# Patient Record
Sex: Male | Born: 1970
Health system: Southern US, Community
[De-identification: ages and names within clinical notes are randomized; demographics above are authoritative.]

## PROBLEM LIST (undated history)

## (undated) ENCOUNTER — Ambulatory Visit: Payer: Medicaid Other | Source: Home / Self Care

## (undated) DIAGNOSIS — R569 Unspecified convulsions: Secondary | ICD-10-CM

## (undated) DIAGNOSIS — D696 Thrombocytopenia, unspecified: Secondary | ICD-10-CM

## (undated) DIAGNOSIS — M1990 Unspecified osteoarthritis, unspecified site: Secondary | ICD-10-CM

## (undated) DIAGNOSIS — Z720 Tobacco use: Secondary | ICD-10-CM

## (undated) DIAGNOSIS — K219 Gastro-esophageal reflux disease without esophagitis: Secondary | ICD-10-CM

## (undated) DIAGNOSIS — F1010 Alcohol abuse, uncomplicated: Secondary | ICD-10-CM

## (undated) DIAGNOSIS — F1210 Cannabis abuse, uncomplicated: Secondary | ICD-10-CM

## (undated) DIAGNOSIS — F1120 Opioid dependence, uncomplicated: Secondary | ICD-10-CM

## (undated) DIAGNOSIS — I10 Essential (primary) hypertension: Secondary | ICD-10-CM

## (undated) DIAGNOSIS — D689 Coagulation defect, unspecified: Secondary | ICD-10-CM

## (undated) DIAGNOSIS — K7030 Alcoholic cirrhosis of liver without ascites: Secondary | ICD-10-CM

## (undated) DIAGNOSIS — J449 Chronic obstructive pulmonary disease, unspecified: Secondary | ICD-10-CM

## (undated) DIAGNOSIS — G629 Polyneuropathy, unspecified: Secondary | ICD-10-CM

## (undated) HISTORY — DX: Gastro-esophageal reflux disease without esophagitis: K21.9

## (undated) HISTORY — DX: Unspecified convulsions: R56.9

## (undated) HISTORY — DX: Unspecified osteoarthritis, unspecified site: M19.90

## (undated) HISTORY — PX: HEMORRHOID SURGERY: SHX153

## (undated) HISTORY — DX: Chronic obstructive pulmonary disease, unspecified: J44.9

---

## 1997-11-04 ENCOUNTER — Emergency Department (HOSPITAL_COMMUNITY): Admission: EM | Admit: 1997-11-04 | Discharge: 1997-11-05 | Payer: Self-pay

## 1999-03-07 ENCOUNTER — Ambulatory Visit (HOSPITAL_COMMUNITY): Admission: RE | Admit: 1999-03-07 | Discharge: 1999-03-07 | Payer: Self-pay | Admitting: Surgery

## 1999-03-07 ENCOUNTER — Encounter (INDEPENDENT_AMBULATORY_CARE_PROVIDER_SITE_OTHER): Payer: Self-pay | Admitting: Specialist

## 2004-11-18 ENCOUNTER — Emergency Department (HOSPITAL_COMMUNITY): Admission: EM | Admit: 2004-11-18 | Discharge: 2004-11-18 | Payer: Self-pay | Admitting: Emergency Medicine

## 2005-03-20 ENCOUNTER — Emergency Department: Payer: Self-pay | Admitting: Internal Medicine

## 2005-03-20 ENCOUNTER — Other Ambulatory Visit: Payer: Self-pay

## 2007-12-23 ENCOUNTER — Ambulatory Visit: Payer: Self-pay | Admitting: Internal Medicine

## 2007-12-23 DIAGNOSIS — M1990 Unspecified osteoarthritis, unspecified site: Secondary | ICD-10-CM | POA: Insufficient documentation

## 2007-12-23 DIAGNOSIS — J441 Chronic obstructive pulmonary disease with (acute) exacerbation: Secondary | ICD-10-CM | POA: Insufficient documentation

## 2007-12-23 DIAGNOSIS — N209 Urinary calculus, unspecified: Secondary | ICD-10-CM | POA: Insufficient documentation

## 2007-12-23 DIAGNOSIS — R569 Unspecified convulsions: Secondary | ICD-10-CM | POA: Insufficient documentation

## 2008-02-08 ENCOUNTER — Telehealth: Payer: Self-pay | Admitting: Internal Medicine

## 2008-02-19 DIAGNOSIS — F1120 Opioid dependence, uncomplicated: Secondary | ICD-10-CM

## 2008-02-19 HISTORY — DX: Opioid dependence, uncomplicated: F11.20

## 2008-04-14 ENCOUNTER — Telehealth: Payer: Self-pay | Admitting: Internal Medicine

## 2008-04-22 ENCOUNTER — Ambulatory Visit: Payer: Self-pay | Admitting: Internal Medicine

## 2008-07-28 ENCOUNTER — Telehealth: Payer: Self-pay | Admitting: Internal Medicine

## 2008-08-05 ENCOUNTER — Ambulatory Visit: Payer: Self-pay | Admitting: Internal Medicine

## 2008-10-07 ENCOUNTER — Ambulatory Visit: Payer: Self-pay | Admitting: Internal Medicine

## 2008-11-23 ENCOUNTER — Telehealth: Payer: Self-pay | Admitting: Internal Medicine

## 2009-01-06 ENCOUNTER — Ambulatory Visit: Payer: Self-pay | Admitting: Internal Medicine

## 2009-01-06 DIAGNOSIS — M79609 Pain in unspecified limb: Secondary | ICD-10-CM | POA: Insufficient documentation

## 2009-01-06 DIAGNOSIS — G609 Hereditary and idiopathic neuropathy, unspecified: Secondary | ICD-10-CM | POA: Insufficient documentation

## 2009-01-06 LAB — CONVERTED CEMR LAB
ALT: 48 units/L (ref 0–53)
AST: 52 units/L — ABNORMAL HIGH (ref 0–37)
Albumin: 4.1 g/dL (ref 3.5–5.2)
Alkaline Phosphatase: 35 units/L — ABNORMAL LOW (ref 39–117)
BUN: 5 mg/dL — ABNORMAL LOW (ref 6–23)
Basophils Absolute: 0 10*3/uL (ref 0.0–0.1)
Basophils Relative: 0.4 % (ref 0.0–3.0)
Bilirubin Urine: NEGATIVE
Bilirubin, Direct: 0.1 mg/dL (ref 0.0–0.3)
CO2: 32 meq/L (ref 19–32)
Calcium: 9.3 mg/dL (ref 8.4–10.5)
Chloride: 103 meq/L (ref 96–112)
Creatinine, Ser: 0.9 mg/dL (ref 0.4–1.5)
Eosinophils Absolute: 0.2 10*3/uL (ref 0.0–0.7)
Eosinophils Relative: 2.2 % (ref 0.0–5.0)
Folate: 7.4 ng/mL
GFR calc non Af Amer: 100.3 mL/min (ref >60)
Glucose, Bld: 81 mg/dL (ref 70–99)
HCT: 41.2 % (ref 39.0–52.0)
Hemoglobin, Urine: NEGATIVE
Hemoglobin: 14 g/dL (ref 13.0–17.0)
Hgb A1c MFr Bld: 4.7 % (ref 4.6–6.5)
Ketones, ur: NEGATIVE mg/dL
Leukocytes, UA: NEGATIVE
Lymphocytes Relative: 34.8 % (ref 12.0–46.0)
Lymphs Abs: 2.7 10*3/uL (ref 0.7–4.0)
MCHC: 34 g/dL (ref 30.0–36.0)
MCV: 100.4 fL — ABNORMAL HIGH (ref 78.0–100.0)
Monocytes Absolute: 0.7 10*3/uL (ref 0.1–1.0)
Monocytes Relative: 9.4 % (ref 3.0–12.0)
Neutro Abs: 4.1 10*3/uL (ref 1.4–7.7)
Neutrophils Relative %: 53.2 % (ref 43.0–77.0)
Nitrite: NEGATIVE
Platelets: 256 10*3/uL (ref 150.0–400.0)
Potassium: 4.3 meq/L (ref 3.5–5.1)
RBC: 4.1 M/uL — ABNORMAL LOW (ref 4.22–5.81)
RDW: 12.6 % (ref 11.5–14.6)
Sodium: 142 meq/L (ref 135–145)
Specific Gravity, Urine: <=1.005 (ref 1.000–1.030)
TSH: 1.24 microintl units/mL (ref 0.35–5.50)
Total Bilirubin: 0.7 mg/dL (ref 0.3–1.2)
Total Protein, Urine: NEGATIVE mg/dL
Total Protein: 7.9 g/dL (ref 6.0–8.3)
Urine Glucose: NEGATIVE mg/dL
Urobilinogen, UA: 0.2 (ref 0.0–1.0)
Vitamin B-12: 382 pg/mL (ref 211–911)
WBC: 7.7 10*3/uL (ref 4.5–10.5)
pH: 5.5 (ref 5.0–8.0)

## 2009-01-09 ENCOUNTER — Telehealth: Payer: Self-pay | Admitting: Internal Medicine

## 2009-01-09 ENCOUNTER — Encounter: Payer: Self-pay | Admitting: Internal Medicine

## 2009-01-11 ENCOUNTER — Telehealth: Payer: Self-pay | Admitting: Internal Medicine

## 2009-03-20 ENCOUNTER — Ambulatory Visit: Payer: Self-pay | Admitting: Internal Medicine

## 2009-05-19 ENCOUNTER — Ambulatory Visit: Payer: Self-pay | Admitting: Internal Medicine

## 2009-07-14 ENCOUNTER — Telehealth: Payer: Self-pay | Admitting: Internal Medicine

## 2009-09-08 ENCOUNTER — Ambulatory Visit: Payer: Self-pay | Admitting: Internal Medicine

## 2009-11-08 ENCOUNTER — Telehealth: Payer: Self-pay | Admitting: Internal Medicine

## 2009-12-14 ENCOUNTER — Ambulatory Visit: Payer: Self-pay | Admitting: Internal Medicine

## 2009-12-27 ENCOUNTER — Encounter: Payer: Self-pay | Admitting: Internal Medicine

## 2009-12-27 ENCOUNTER — Telehealth: Payer: Self-pay | Admitting: Internal Medicine

## 2010-01-01 ENCOUNTER — Telehealth: Payer: Self-pay | Admitting: Internal Medicine

## 2010-01-02 ENCOUNTER — Encounter: Payer: Self-pay | Admitting: Internal Medicine

## 2010-01-22 ENCOUNTER — Telehealth (INDEPENDENT_AMBULATORY_CARE_PROVIDER_SITE_OTHER): Payer: Self-pay | Admitting: *Deleted

## 2010-03-08 ENCOUNTER — Telehealth: Payer: Self-pay | Admitting: Internal Medicine

## 2010-03-22 NOTE — Letter (Signed)
Summary: Generic Letter  [facility]  [address] [place], [place] [address]   Phone: [phone]  Fax: [phone]        12/27/2009  Jacob [facility] [address] [place], [place]  [address]  Dear [name],   The above patient is being treated for a seizure disorder with Valium.        Sincerely,   Sanda Linger MD  Appended Document: Generic Letter [facility] main # gave [place] the wrong fax #. LEft vm w/[facility] to get correct fax #  (phone # is [phone])  Appended Document: Generic Letter Letter re-sent to [facility] per Ami. Pt called today at 12:45 to inquire on status of fax and was informed of same via VM

## 2010-03-22 NOTE — Progress Notes (Signed)
Summary: med refill  Phone Note Refill Request Message from:  Pharmacy on March 08, 2010 9:57 AM  Refills Requested: Medication #1:  DIAZEPAM 10 MG TABS every 6 hrs as needed   Dosage confirmed as above?Dosage Confirmed   Supply Requested: 3 months Initial call taken by: Zella Ball Ewing CMA Duncan Dull),  March 08, 2010 9:57 AM  Follow-up for Phone Call        ok Follow-up by: Etta Grandchild MD,  March 08, 2010 11:03 AM  Additional Follow-up for Phone Call Additional follow up Details #1::        faxed hardcopy to pharmacy Additional Follow-up by: Robin Ewing CMA Duncan Dull),  March 08, 2010 11:34 AM    Prescriptions: DIAZEPAM 10 MG TABS (DIAZEPAM) every 6 hrs as needed  #60 x 3   Entered by:   Zella Ball Ewing CMA (AAMA)   Authorized by:   Etta Grandchild MD   Signed by:   Scharlene Gloss CMA (AAMA) on 03/08/2010   Method used:   Printed then faxed to ...       [facility]  [address] # [id number]* (retail)       [address]       [place], [place]  [address]       Ph: [phone] or [phone]       Fax: [phone]   RxID:   [id number]

## 2010-03-22 NOTE — Letter (Signed)
Summary: Generic Letter  [facility]  [address] [place], [place] [address]   Phone: [phone]  Fax: [phone]         09/08/2009    DIONIS AUTRY [address] [place], [place]  [address]  Dear Mr. Placzek,  I am writing this letter at your request to comment on your medical conditions. It is my understanding from you that you have been turned down on your application for disability and that a note from your physician would be helpful.  It is my opinion that you will out of work for an extended period of time (> 2 years) due to degenerative joint disease in your feet, peripheral neuropathy, and seizure disorder.       Sincerely,   Sanda Linger MD

## 2010-03-22 NOTE — Assessment & Plan Note (Signed)
Summary: follow up rx refill-lb   Vital Signs:  Patient profile:   40 year old male Height:      72 inches Weight:      192 pounds BMI:     26.13 O2 Sat:      98 % on Room air Temp:     98.1 degrees F oral Pulse rate:   90 / minute Pulse rhythm:   regular Resp:     16 per minute BP sitting:   132 / 80  (left arm) Cuff size:   large  Vitals Entered By: Rock Nephew CMA (March 20, 2009 1:57 PM)  Nutrition Counseling: Patient's BMI is greater than 25 and therefore counseled on weight management options.  O2 Flow:  Room air  Primary Care Provider:  Etta Grandchild MD   History of Present Illness: He returns for f/up and says that he was not able to do the NCS/EMG due to the cost. He wants to apply for Medicaid. He has worsening arthritis pain in his left foot in the 1st MTP joint.  He is doing well on Diazepam with no seizures.  Hypertension History:      He denies headache, chest pain, palpitations, dyspnea with exertion, orthopnea, PND, peripheral edema, visual symptoms, neurologic problems, and syncope.        Positive major cardiovascular risk factors include hypertension and current tobacco user.  Negative major cardiovascular risk factors include male age less than 43 years old, no history of diabetes or hyperlipidemia, and negative family history for ischemic heart disease.        Further assessment for target organ damage reveals no history of ASHD, cardiac end-organ damage (CHF/LVH), stroke/TIA, peripheral vascular disease, renal insufficiency, or hypertensive retinopathy.     Preventive Screening-Counseling & Management  Alcohol-Tobacco     Smoking Cessation Counseling: yes  Current Medications (verified): 1)  Diazepam 10 Mg Tabs (Diazepam) .... Every 6 Hrs As Needed 2)  Methadone Hcl 40 Mg Tbso (Methadone Hcl) .... 3 Once Daily 3)  Celebrex 200 Mg Caps (Celecoxib) .... One By Mouth Once Daily As Needed For Arthritis  Allergies (verified): 1)  ! Codeine 2)   ! Nsaids  Past History:  Past Medical History: Reviewed history from 12/23/2007 and no changes required. COPD GERD Hypertension Osteoarthritis Seizure disorder  Past Surgical History: Reviewed history from 12/23/2007 and no changes required. Denies surgical history  Family History: Reviewed history from 12/23/2007 and no changes required. Family History Diabetes 1st degree relative Family History Hypertension  Social History: Reviewed history from 12/23/2007 and no changes required. Domestic Partner Current Smoker Alcohol use-yes Drug use-yes Regular exercise-no  Review of Systems  The patient denies anorexia, fever, weight loss, prolonged cough, hemoptysis, abdominal pain, hematuria, abnormal bleeding, and enlarged lymph nodes.   MS:  Complains of joint pain; denies joint redness, joint swelling, loss of strength, muscle, muscle weakness, stiffness, and thoracic pain.  Physical Exam  General:  alert, well-developed, well-nourished, well-hydrated, cooperative to examination, and good hygiene.   Head:  normocephalic, atraumatic, and no abnormalities observed.   Mouth:  Oral mucosa and oropharynx without lesions or exudates.  Teeth in good repair. Neck:  supple, full ROM, no masses, and no thyromegaly.   Lungs:  normal respiratory effort, no intercostal retractions, no accessory muscle use, normal breath sounds, and no dullness.   Heart:  normal rate, regular rhythm, no murmur, and no gallop.   Abdomen:  soft, non-tender, normal bowel sounds, no distention, no masses, no hepatomegaly,  and no splenomegaly.   Msk:  normal ROM, no joint tenderness, no joint swelling, no joint warmth, no redness over joints, no joint deformities, no joint instability, and no crepitation. he has palpable DJD in the left foot, 1st MTP joint.  Pulses:  R and L carotid,radial,femoral,dorsalis pedis and posterior tibial pulses are full and equal bilaterally Extremities:  No clubbing, cyanosis,  edema, or deformity noted with normal full range of motion of all joints.   Neurologic:  No cranial nerve deficits noted. Station and gait are normal. Plantar reflexes are down-going bilaterally. DTRs are symmetrical throughout. Sensory, motor and coordinative functions appear intact. Skin:  turgor normal, color normal, no rashes, no suspicious lesions, no ecchymoses, and no petechiae.   Psych:  Cognition and judgment appear intact. Alert and cooperative with normal attention span and concentration. No apparent delusions, illusions, hallucinations   Impression & Recommendations:  Problem # 1:  FOOT PAIN, BILATERAL (ICD-729.5) Assessment Deteriorated he may need a surgical fusion of this joint Orders: Social Work Referral (Social )  Problem # 2:  SEIZURE DISORDER (ICD-780.39) Assessment: Unchanged  Problem # 3:  OSTEOARTHRITIS (ICD-715.90) Assessment: Deteriorated  His updated medication list for this problem includes:    Methadone Hcl 40 Mg Tbso (Methadone hcl) .Marland KitchenMarland KitchenMarland KitchenMarland Kitchen 3 once daily    Celebrex 200 Mg Caps (Celecoxib) ..... One by mouth once daily as needed for arthritis  Orders: Orthopedic Surgeon Referral (Ortho Surgeon)  Discussed use of medications, application of heat or cold, and exercises.   Complete Medication List: 1)  Diazepam 10 Mg Tabs (Diazepam) .... Every 6 hrs as needed 2)  Methadone Hcl 40 Mg Tbso (Methadone hcl) .... 3 once daily 3)  Celebrex 200 Mg Caps (Celecoxib) .... One by mouth once daily as needed for arthritis  Hypertension Assessment/Plan:      The patient's hypertensive risk group is category B: At least one risk factor (excluding diabetes) with no target organ damage.  Today's blood pressure is 132/80.  His blood pressure goal is < 140/90.  Patient Instructions: 1)  Please schedule a follow-up appointment in 2 months. 2)  Tobacco is very bad for your health and your loved ones! You Should stop smoking!. 3)  Stop Smoking Tips: Choose a Quit date. Cut  down before the Quit date. decide what you will do as a substitute when you feel the urge to smoke(gum,toothpick,exercise). 4)  Check your Blood Pressure regularly. If it is above 140/90: you should make an appointment. Prescriptions: DIAZEPAM 10 MG TABS (DIAZEPAM) every 6 hrs as needed  #60 x 3   Entered and Authorized by:   Etta Grandchild MD   Signed by:   Etta Grandchild MD on 03/20/2009   Method used:   Print then Give to Patient   RxID:   [id number] CELEBREX 200 MG CAPS (CELECOXIB) One by mouth once daily as needed for arthritis  #30 x 0   Entered and Authorized by:   Etta Grandchild MD   Signed by:   Etta Grandchild MD on 03/20/2009   Method used:   Samples Given   RxID:   [id number]

## 2010-03-22 NOTE — Assessment & Plan Note (Signed)
Summary: FU [facility]  RS'D PER PT/NWS   Vital Signs:  Patient profile:   40 year old male Height:      72 inches Weight:      194.50 pounds BMI:     26.47 O2 Sat:      97 % on Room air Temp:     97.8 degrees F oral Pulse rate:   80 / minute Pulse rhythm:   regular Resp:     16 per minute BP sitting:   100 / 72  (left arm) Cuff size:   large  Vitals Entered By: Rock Nephew CMA (September 08, 2009 1:22 PM)  Nutrition Counseling: Patient's BMI is greater than 25 and therefore counseled on weight management options.  O2 Flow:  Room air CC: Bilateral foot pain  Is Patient Diabetic? No Pain Assessment Patient in pain? no        Primary Care Provider:  Etta Grandchild MD  CC:  Bilateral foot pain .  History of Present Illness: He has DJD in both feet and he needs a letter for a disability claim since he can't work. He can't afford to see ortho. or neurology.  Preventive Screening-Counseling & Management  Alcohol-Tobacco     Alcohol drinks/day: 2     Smoking Status: current     Smoking Cessation Counseling: yes     Packs/Day: 2     Year Started: 1989     Cans of tobacco/week: no     Passive Smoke Exposure: yes     Tobacco Counseling: to quit use of tobacco products  Hep-HIV-STD-Contraception     Hepatitis Risk: no risk noted     HIV Risk: no     STD Risk: no risk noted      Drug Use:  yes.    Medications Prior to Update: 1)  Diazepam 10 Mg Tabs (Diazepam) .... Every 6 Hrs As Needed 2)  Methadone Hcl 40 Mg Tbso (Methadone Hcl) .... 3 Once Daily  Current Medications (verified): 1)  Diazepam 10 Mg Tabs (Diazepam) .... Every 6 Hrs As Needed 2)  Methadone Hcl 40 Mg Tbso (Methadone Hcl) .... 3 Once Daily  Allergies (verified): 1)  ! Codeine 2)  ! Nsaids  Past History:  Past Surgical History: Last updated: 12/23/2007 Denies surgical history  Family History: Last updated: 12/23/2007 Family History Diabetes 1st degree relative Family History  Hypertension  Social History: Last updated: 12/23/2007 Domestic Partner Current Smoker Alcohol use-yes Drug use-yes Regular exercise-no  Risk Factors: Alcohol Use: 2 (09/08/2009) Exercise: no (12/23/2007)  Risk Factors: Smoking Status: current (09/08/2009) Packs/Day: 2 (09/08/2009) Cans of tobacco/wk: no (09/08/2009) Passive Smoke Exposure: yes (09/08/2009)  Past Medical History: COPD GERD Osteoarthritis Seizure disorder  Family History: Reviewed history from 12/23/2007 and no changes required. Family History Diabetes 1st degree relative Family History Hypertension  Social History: Reviewed history from 12/23/2007 and no changes required. Domestic Partner Current Smoker Alcohol use-yes Drug use-yes Regular exercise-no Hepatitis Risk:  no risk noted STD Risk:  no risk noted  Review of Systems       The patient complains of difficulty walking.  The patient denies anorexia, fever, weight loss, weight gain, chest pain, syncope, dyspnea on exertion, peripheral edema, abdominal pain, hematuria, and depression.   MS:  Complains of joint pain; denies joint redness, joint swelling, low back pain, muscle aches, and stiffness. Neuro:  Denies brief paralysis, difficulty with concentration, headaches, numbness, poor balance, seizures, tingling, tremors, and weakness.  Physical Exam  General:  alert, well-developed, well-nourished, well-hydrated, cooperative to examination, and good hygiene.   Mouth:  Oral mucosa and oropharynx without lesions or exudates.  Teeth in good repair. Neck:  supple, full ROM, no masses, and no thyromegaly.   Lungs:  normal respiratory effort, no intercostal retractions, no accessory muscle use, normal breath sounds, and no dullness.   Heart:  normal rate, regular rhythm, no murmur, and no gallop.   Abdomen:  soft, non-tender, normal bowel sounds, no distention, no masses, no hepatomegaly, and no splenomegaly.   Msk:  normal ROM, no joint  tenderness, no joint swelling, no joint warmth, no redness over joints, no joint deformities, no joint instability, and no crepitation. he has palpable DJD in the feet, 1st MTP joint.  Pulses:  R and L carotid,radial,femoral,dorsalis pedis and posterior tibial pulses are full and equal bilaterally Extremities:  No clubbing, cyanosis, edema, or deformity noted with normal full range of motion of all joints.   Neurologic:  No cranial nerve deficits noted. Station and gait are normal. Plantar reflexes are down-going bilaterally. DTRs are symmetrical throughout. Sensory, motor and coordinative functions appear intact. Skin:  turgor normal, color normal, no rashes, no suspicious lesions, no ecchymoses, and no petechiae.   Cervical Nodes:  no anterior cervical adenopathy and no posterior cervical adenopathy.   Psych:  Cognition and judgment appear intact. Alert and cooperative with normal attention span and concentration. No apparent delusions, illusions, hallucinations   Impression & Recommendations:  Problem # 1:  FOOT PAIN, BILATERAL (ICD-729.5) Assessment Unchanged  Problem # 2:  OSTEOARTHRITIS (ICD-715.90) Assessment: Unchanged  His updated medication list for this problem includes:    Methadone Hcl 40 Mg Tbso (Methadone hcl) .Marland KitchenMarland KitchenMarland KitchenMarland Kitchen 3 once daily  Complete Medication List: 1)  Diazepam 10 Mg Tabs (Diazepam) .... Every 6 hrs as needed 2)  Methadone Hcl 40 Mg Tbso (Methadone hcl) .... 3 once daily  Patient Instructions: 1)  Please schedule a follow-up appointment in 3 months. 2)  Tobacco is very bad for your health and your loved ones! You Should stop smoking!. 3)  Stop Smoking Tips: Choose a Quit date. Cut down before the Quit date. decide what you will do as a substitute when you feel the urge to smoke(gum,toothpick,exercise). 4)  Take 650-1000mg  of Tylenol every 4-6 hours as needed for relief of pain or comfort of fever AVOID taking more than 4000mg   in a 24 hour period (can cause liver  damage in higher doses).

## 2010-03-22 NOTE — Letter (Signed)
Summary: Generic Letter  [facility]  [address] [place], [place] [address]   Phone: [phone]  Fax: [phone]    01/02/2010  ERIC NEES [address] [place], [place]  [address]    To Whom It May Concern:   This letter should serve as written notice that the above named patient   does not and has not received prescriptions for Methodone from our   office.               Sincerely,     Renae Fickle.D.

## 2010-03-22 NOTE — Assessment & Plan Note (Signed)
Summary: 2 MO ROV /NWS  #   Vital Signs:  Patient profile:   40 year old male Height:      72 inches Weight:      203 pounds BMI:     27.63 O2 Sat:      94 % on Room air Temp:     98.1 degrees F oral Pulse rate:   90 / minute Pulse rhythm:   regular Resp:     16 per minute BP sitting:   120 / 80  (left arm) Cuff size:   large  Vitals Entered By: Rock Nephew CMA (May 19, 2009 1:10 PM)  Nutrition Counseling: Patient's BMI is greater than 25 and therefore counseled on weight management options.  O2 Flow:  Room air CC: headache, arm pain Is Patient Diabetic? No Pain Assessment Patient in pain? yes     Location: arm Type: dull   Primary Care Provider:  Etta Grandchild MD  CC:  headache and arm pain.  History of Present Illness: He returns for f/up and he reports that he is doing well and has not had any seizures since I last saw him. He is still having foot pain and he is considering applying for Medicaid/Disablilty. He stopped taking Celebrex b/c it caused a headache.  Preventive Screening-Counseling & Management  Alcohol-Tobacco     Smoking Cessation Counseling: yes  Current Medications (verified): 1)  Diazepam 10 Mg Tabs (Diazepam) .... Every 6 Hrs As Needed 2)  Methadone Hcl 40 Mg Tbso (Methadone Hcl) .... 3 Once Daily 3)  Celebrex 200 Mg Caps (Celecoxib) .... One By Mouth Once Daily As Needed For Arthritis  Allergies (verified): 1)  ! Codeine 2)  ! Nsaids  Past History:  Past Medical History: Reviewed history from 12/23/2007 and no changes required. COPD GERD Hypertension Osteoarthritis Seizure disorder  Past Surgical History: Reviewed history from 12/23/2007 and no changes required. Denies surgical history  Family History: Reviewed history from 12/23/2007 and no changes required. Family History Diabetes 1st degree relative Family History Hypertension  Social History: Reviewed history from 12/23/2007 and no changes required. Domestic  Partner Current Smoker Alcohol use-yes Drug use-yes Regular exercise-no  Review of Systems  The patient denies anorexia, fever, weight loss, weight gain, chest pain, dyspnea on exertion, peripheral edema, prolonged cough, headaches, hemoptysis, abdominal pain, hematuria, suspicious skin lesions, and depression.    Physical Exam  General:  alert, well-developed, well-nourished, well-hydrated, cooperative to examination, and good hygiene.   Head:  normocephalic, atraumatic, and no abnormalities observed.   Mouth:  Oral mucosa and oropharynx without lesions or exudates.  Teeth in good repair. Neck:  supple, full ROM, no masses, and no thyromegaly.   Lungs:  normal respiratory effort, no intercostal retractions, no accessory muscle use, normal breath sounds, and no dullness.   Heart:  normal rate, regular rhythm, no murmur, and no gallop.   Abdomen:  soft, non-tender, normal bowel sounds, no distention, no masses, no hepatomegaly, and no splenomegaly.   Msk:  normal ROM, no joint tenderness, no joint swelling, no joint warmth, no redness over joints, no joint deformities, no joint instability, and no crepitation. he has palpable DJD in the left foot, 1st MTP joint.  Pulses:  R and L carotid,radial,femoral,dorsalis pedis and posterior tibial pulses are full and equal bilaterally Extremities:  No clubbing, cyanosis, edema, or deformity noted with normal full range of motion of all joints.   Neurologic:  No cranial nerve deficits noted. Station and gait are normal.  Plantar reflexes are down-going bilaterally. DTRs are symmetrical throughout. Sensory, motor and coordinative functions appear intact. Skin:  turgor normal, color normal, no rashes, no suspicious lesions, no ecchymoses, and no petechiae.   Cervical Nodes:  no anterior cervical adenopathy and no posterior cervical adenopathy.   Psych:  Cognition and judgment appear intact. Alert and cooperative with normal attention span and  concentration. No apparent delusions, illusions, hallucinations   Impression & Recommendations:  Problem # 1:  FOOT PAIN, BILATERAL (ICD-729.5) Assessment Unchanged  Problem # 2:  PERIPHERAL NEUROPATHY (ICD-356.9) Pt will not do any further testing due to the cost.  Problem # 3:  SEIZURE DISORDER (ICD-780.39) Assessment: Unchanged  Problem # 4:  HYPERTENSION (ICD-401.9) Assessment: Improved  BP today: 120/80 Prior BP: 132/80 (03/20/2009)  Prior 10 Yr Risk Heart Disease: Not enough information (12/23/2007)  Labs Reviewed: K+: 4.3 (01/06/2009) Creat: : 0.9 (01/06/2009)     Complete Medication List: 1)  Diazepam 10 Mg Tabs (Diazepam) .... Every 6 hrs as needed 2)  Methadone Hcl 40 Mg Tbso (Methadone hcl) .... 3 once daily  Patient Instructions: 1)  Please schedule a follow-up appointment as needed. 2)  Tobacco is very bad for your health and your loved ones! You Should stop smoking!. 3)  Stop Smoking Tips: Choose a Quit date. Cut down before the Quit date. decide what you will do as a substitute when you feel the urge to smoke(gum,toothpick,exercise).

## 2010-03-22 NOTE — Progress Notes (Signed)
Summary: LETTER  Phone Note Call from Patient Call back at Home Phone [phone]   Summary of Call: Pt is changing methadone clinics, from [facility] to [facility]. They need letter from MD stating pt is being treated for seizure disorder with valium.  Initial call taken by: Lamar Sprinkles, CMA,  December 27, 2009 3:07 PM  Follow-up for Phone Call        Pt informed, [facility] Follow-up by: Lamar Sprinkles, CMA,  December 27, 2009 4:28 PM

## 2010-03-22 NOTE — Progress Notes (Signed)
  Phone Note Refill Request Message from:  [employer] on November 08, 2009 4:40 PM  Refills Requested: Medication #1:  DIAZEPAM 10 MG TABS every 6 hrs as needed   Dosage confirmed as above?Dosage Confirmed   Supply Requested: 3 months  Is this ok to refill?  Initial call taken by: Rock Nephew CMA,  November 08, 2009 4:40 PM  Follow-up for Phone Call        yes Follow-up by: Etta Grandchild MD,  November 08, 2009 4:49 PM    Prescriptions: DIAZEPAM 10 MG TABS (DIAZEPAM) every 6 hrs as needed  #60 x 3   Entered by:   Rock Nephew CMA   Authorized by:   Etta Grandchild MD   Signed by:   Rock Nephew CMA on 11/09/2009   Method used:   Telephoned to ...       [facility]  [address] # [facility]* (retail)       [address]       [place], [place]  [address]       Ph: [phone] or [phone]       Fax: [phone]   RxID:   [id number]

## 2010-03-22 NOTE — Progress Notes (Signed)
Summary: Letter request  Phone Note Call from Patient Call back at Home Phone [phone]   Caller: Patient Call For: Etta Grandchild MD Summary of Call: Pt stated that he need a letter stating that Dr. Yetta Barre does not  precribe METHADONE to him. Pt stated that he is transfering from [facility] to [facility] and both place need the letter from Dr. Yetta Barre in order for pt to receive Methadone. Pt need that ASAP. Thanks  Initial call taken by: Livingston Diones,  January 01, 2010 4:08 PM  Follow-up for Phone Call        Would this be ok to provide for pt.Alvy Beal Archie CMA  January 02, 2010 8:52 AM   Additional Follow-up for Phone Call Additional follow up Details #1::        sure Additional Follow-up by: Etta Grandchild MD,  January 02, 2010 8:58 AM    Additional Follow-up for Phone Call Additional follow up Details #2::    LMOVM for pt to call back// need to advise letters are ready  Spoke w/pt & treatment center. Per Nurse at treatment center - They need letter stating that MD is aware that patient is on Methadone 120mg  liquid once daily and MD is ok prescribing a benzodiazepine, Valium 10mg  1 every six hours as needed.   Ok for above letter?  [facility] to be [facility] to [facility] at [phone] and current tx facility [phone]..................Marland KitchenLamar Sprinkles, CMA  January 02, 2010 9:35 AM   Additional Follow-up for Phone Call Additional follow up Details #3:: Details for Additional Follow-up Action Taken: yes   letter completed & faxed to both #'s................Marland KitchenLamar Sprinkles, CMA  January 02, 2010 12:12 PM  Additional Follow-up by: Etta Grandchild MD,  January 02, 2010 9:44 AM

## 2010-03-22 NOTE — Assessment & Plan Note (Signed)
Summary: 3 MO ROV /NWS  #   Vital Signs:  Patient profile:   40 year old male Height:      72 inches Weight:      202 pounds BMI:     27.50 O2 Sat:      97 % on Room air Temp:     98.5 degrees F oral Pulse rate:   80 / minute Pulse rhythm:   regular Resp:     16 per minute BP sitting:   126 / 80  (left arm) Cuff size:   large  Vitals Entered By: Rock Nephew CMA (December 14, 2009 1:07 PM)  Nutrition Counseling: Patient's BMI is greater than 25 and therefore counseled on weight management options.  O2 Flow:  Room air CC: follow-up visit Is Patient Diabetic? No  Does patient need assistance? Functional Status Self care Ambulation Normal   Primary Care [facility] [place]el[place]:  Etta Grandchild MD  CC:  follow-up visit.  History of Present Illness: He returns for f/up and to remind me of his foot pain. He has not had surgery yet b/c he does not have the money and has not been granted medicaid or medicare benefits yet. He has no new or worsening symptoms today. He has not had a seizure.  Preventive Screening-Counseling & Management  Alcohol-Tobacco     Alcohol drinks/day: 2     Alcohol type: all     >5/day in last 3 mos: no     Alcohol Counseling: to decrease amount and/or frequency of alcohol intake     Feels need to cut down: no     Feels annoyed by complaints: no     Feels guilty re: drinking: no     Needs 'eye opener' in am: no     Smoking Status: current     Smoking Cessation Counseling: yes     Packs/Day: 2     Year Started: 1989     Cans of tobacco/week: no     Passive Smoke Exposure: yes     Tobacco Counseling: to quit use of tobacco products  Hep-HIV-STD-Contraception     Hepatitis Risk: no risk noted     HIV Risk: no     STD Risk: no risk noted      Drug Use:  yes.    Medications Prior to Update: 1)  Diazepam 10 Mg Tabs (Diazepam) .... Every 6 Hrs As Needed 2)  Methadone Hcl 40 Mg Tbso (Methadone Hcl) .... 3 Once Daily  Current Medications (verified): 1)   Diazepam 10 Mg Tabs (Diazepam) .... Every 6 Hrs As Needed 2)  Methadone Hcl 40 Mg Tbso (Methadone Hcl) .... 3 Once Daily  Allergies (verified): 1)  ! Codeine 2)  ! Nsaids  Past History:  Past Medical History: Last updated: 09/08/2009 COPD GERD Osteoarthritis Seizure disorder  Past Surgical History: Last updated: 12/23/2007 Denies surgical history  Family History: Last updated: 12/23/2007 Family History Diabetes 1st degree relative Family History Hypertension  Social History: Last updated: 12/23/2007 Domestic Partner Current Smoker Alcohol use-yes Drug use-yes Regular exercise-no  Risk Factors: Alcohol Use: 2 (12/14/2009) >5 drinks/d w/in last 3 months: no (12/14/2009) Exercise: no (12/23/2007)  Risk Factors: Smoking Status: current (12/14/2009) Packs/Day: 2 (12/14/2009) Cans of tobacco/wk: no (12/14/2009) Passive Smoke Exposure: yes (12/14/2009)  Family History: Reviewed history from 12/23/2007 and no changes required. Family History Diabetes 1st degree relative Family History Hypertension  Social History: Reviewed history from 12/23/2007 and no changes required. Domestic Partner Current Smoker Alcohol use-yes  Drug use-yes Regular exercise-no  Review of Systems  The patient denies anorexia, fever, chest pain, peripheral edema, prolonged cough, headaches, hemoptysis, abdominal pain, hematuria, depression, enlarged lymph nodes, and angioedema.   MS:  Complains of joint pain; denies joint redness, joint swelling, loss of strength, low back pain, muscle aches, and stiffness. Neuro:  Denies difficulty with concentration, disturbances in coordination, falling down, headaches, inability to speak, memory loss, numbness, seizures, tingling, tremors, visual disturbances, and weakness.  Physical Exam  General:  alert, well-developed, well-nourished, well-hydrated, cooperative to examination, and good hygiene.   Mouth:  Oral mucosa and oropharynx without lesions  or exudates.  Teeth in good repair. Neck:  supple, full ROM, no masses, and no thyromegaly.   Lungs:  normal respiratory effort, no intercostal retractions, no accessory muscle use, normal breath sounds, and no dullness.   Heart:  normal rate, regular rhythm, no murmur, and no gallop.   Abdomen:  soft, non-tender, normal bowel sounds, no distention, no masses, no hepatomegaly, and no splenomegaly.   Msk:  normal ROM, no joint tenderness, no joint swelling, no joint warmth, no redness over joints, no joint deformities, no joint instability, and no crepitation. he has palpable DJD in the feet, 1st MTP joint.  Pulses:  R and L carotid,radial,femoral,dorsalis pedis and posterior tibial pulses are full and equal bilaterally Extremities:  No clubbing, cyanosis, edema, or deformity noted with normal full range of motion of all joints.   Neurologic:  No cranial nerve deficits noted. Station and gait are normal. Plantar reflexes are down-going bilaterally. DTRs are symmetrical throughout. Sensory, motor and coordinative functions appear intact. Skin:  turgor normal, color normal, no rashes, no suspicious lesions, no ecchymoses, and no petechiae.   Cervical Nodes:  no anterior cervical adenopathy and no posterior cervical adenopathy.   Psych:  Cognition and judgment appear intact. Alert and cooperative with normal attention span and concentration. No apparent delusions, illusions, hallucinations   Impression & Recommendations:  Problem # 1:  FOOT PAIN, BILATERAL (ICD-729.5) Assessment Unchanged  Problem # 2:  SEIZURE DISORDER (ICD-780.39) Assessment: Improved  Problem # 3:  OSTEOARTHRITIS (ICD-715.90) Assessment: Unchanged  His updated medication list for this problem includes:    Methadone Hcl 40 Mg Tbso (Methadone hcl) .Marland KitchenMarland KitchenMarland KitchenMarland Kitchen 3 once daily  Complete Medication List: 1)  Diazepam 10 Mg Tabs (Diazepam) .... Every 6 hrs as needed 2)  Methadone Hcl 40 Mg Tbso (Methadone hcl) .... 3 once  daily  Patient Instructions: 1)  Please schedule a follow-up appointment in 3 months. 2)  Tobacco is very bad for your health and your loved ones! You Should stop smoking!. 3)  Stop Smoking Tips: Choose a Quit date. Cut down before the Quit date. decide what you will do as a substitute when you feel the urge to smoke(gum,toothpick,exercise).   Orders Added: 1)  Est. Patient Level III [30865]   Immunization History:  Tetanus/Td Immunization History:    Tetanus/Td:  historical (10/23/2004)   Immunization History:  Tetanus/Td Immunization History:    Tetanus/Td:  Historical (10/23/2004)  Prevention & Chronic Care Immunizations   Influenza vaccine: Not documented   Influenza vaccine deferral: Refused  (12/14/2009)    Tetanus booster: 10/23/2004: Historical   Td booster deferral: Refused  (12/14/2009)    Pneumococcal vaccine: Not documented  Other Screening   Smoking status: current  (12/14/2009)   Smoking cessation counseling: yes  (12/14/2009)  Lipids   Total Cholesterol: Not documented   LDL: Not documented   LDL Direct: Not documented  HDL: Not documented   Triglycerides: Not documented

## 2010-03-22 NOTE — Letter (Signed)
Summary: Generic Letter  [facility]  [address] [place], [place] [address]   Phone: [phone]  Fax: [phone]    01/02/2010  [name] [address] [place], [place]  [address]  FAX: [phone] AND [phone]   To whom it may concern   I am the current primary care physician of the above named patient, Jacob Hill, DOB October 30, 1970. I prescribe a benzodiazepine, valium 10mg  1 every six hours as needed for his seizure disorder. I am aware that he is also prescribed Methadone 120mg  once daily.       Sincerely,    Sanda Linger, M.D.

## 2010-03-22 NOTE — Progress Notes (Signed)
  Phone Note Other Incoming   Request: Send information Summary of Call: Request for records received from [facility]. Request forwarded to [facility].     

## 2010-03-22 NOTE — Progress Notes (Signed)
Summary: REFILL   Phone Note Refill Request Call back at Home Phone [phone]   Refills Requested: Medication #1:  DIAZEPAM 10 MG TABS every 6 hrs as needed   Supply Requested: 6 months Initial call taken by: Lamar Sprinkles, CMA,  Jul 14, 2009 4:03 PM  Follow-up for Phone Call        ok Follow-up by: Etta Grandchild MD,  Jul 14, 2009 4:08 PM  Additional Follow-up for Phone Call Additional follow up Details #1::        Pt informed  Additional Follow-up by: Lamar Sprinkles, CMA,  Jul 14, 2009 5:13 PM    Prescriptions: DIAZEPAM 10 MG TABS (DIAZEPAM) every 6 hrs as needed  #60 x 3   Entered by:   Lamar Sprinkles, CMA   Authorized by:   Etta Grandchild MD   Signed by:   Lamar Sprinkles, CMA on 07/14/2009   Method used:   Telephoned to ...       [facility]  [address] # [id number]* (retail)       [address]       [place], [place]  [address]       Ph: [phone] or [phone]       Fax: [phone]   RxID:   [id number]

## 2010-04-16 ENCOUNTER — Encounter: Payer: Self-pay | Admitting: Internal Medicine

## 2010-04-16 ENCOUNTER — Ambulatory Visit (INDEPENDENT_AMBULATORY_CARE_PROVIDER_SITE_OTHER): Payer: Self-pay | Admitting: Internal Medicine

## 2010-04-16 DIAGNOSIS — M1990 Unspecified osteoarthritis, unspecified site: Secondary | ICD-10-CM

## 2010-04-16 DIAGNOSIS — R569 Unspecified convulsions: Secondary | ICD-10-CM

## 2010-04-26 NOTE — Letter (Signed)
Summary: Generic Letter  [facility]  [address] [place], [place] [address]   Phone: [phone]  Fax: [phone]          04/16/2010  Jacob Hill [address] [place], [place]  [address]  Dear Mr. Sherrin,  This letter serves to document that I DO NOT and have NEVER prescribed Methadone for you. I do prescribe Valium for you seizure disorder.       Sincerely,   Sanda Linger MD

## 2010-04-26 NOTE — Assessment & Plan Note (Signed)
Summary: 3-4 MO FU Jacob Hill Natale Milch   Vital Signs:  Patient profile:   40 year old male Height:      72 inches Weight:      199 pounds BMI:     27.09 O2 Sat:      99 % on Room air Temp:     99.5 degrees F oral Pulse rate:   80 / minute Pulse rhythm:   regular Resp:     16 per minute BP sitting:   134 / 86  (left arm) Cuff size:   regular  Vitals Entered By: Lamar Sprinkles, CMA (April 16, 2010 2:14 PM)  Nutrition Counseling: Patient's BMI is greater than 25 and therefore counseled on weight management options.  O2 Flow:  Room air CC: F/u Is Patient Diabetic? No Pain Assessment Patient in pain? no        Primary Care Provider:  Etta Grandchild MD  CC:  F/u.  History of Present Illness:  Follow-Up Visit      This is a 40 year old man who presents for Follow-up visit.  The patient denies chest pain, palpitations, dizziness, syncope, edema, SOB, DOE, PND, and orthopnea.  Since the last visit the patient notes no new problems or concerns.  The patient reports taking meds as prescribed and monitoring BP.  When questioned about possible medication side effects, the patient notes none.    Preventive Screening-Counseling & Management  Alcohol-Tobacco     Alcohol drinks/day: 2     Alcohol type: all     >5/day in last 3 mos: no     Alcohol Counseling: to decrease amount and/or frequency of alcohol intake     Feels need to cut down: no     Feels annoyed by complaints: no     Feels guilty re: drinking: no     Needs 'eye opener' in am: no     Smoking Status: current     Smoking Cessation Counseling: yes     Packs/Day: 2     Year Started: 1989     Cans of tobacco/week: no     Passive Smoke Exposure: yes     Tobacco Counseling: to quit use of tobacco products  Hep-HIV-STD-Contraception     Hepatitis Risk: no risk noted     HIV Risk: no     STD Risk: no risk noted      Drug Use:  yes.    Clinical Review Panels:  Immunizations   Last Tetanus Booster:  Historical  (10/23/2004)  Diabetes Management   HgBA1C:  4.7 (01/06/2009)   Creatinine:  0.9 (01/06/2009)  CBC   WBC:  7.7 (01/06/2009)   RBC:  4.10 (01/06/2009)   Hgb:  14.0 (01/06/2009)   Hct:  41.2 (01/06/2009)   Platelets:  256.0 (01/06/2009)   MCV  100.4 (01/06/2009)   MCHC  34.0 (01/06/2009)   RDW  12.6 (01/06/2009)   PMN:  53.2 (01/06/2009)   Lymphs:  34.8 (01/06/2009)   Monos:  9.4 (01/06/2009)   Eosinophils:  2.2 (01/06/2009)   Basophil:  0.4 (01/06/2009)  Complete Metabolic Panel   Glucose:  81 (01/06/2009)   Sodium:  142 (01/06/2009)   Potassium:  4.3 (01/06/2009)   Chloride:  103 (01/06/2009)   CO2:  32 (01/06/2009)   BUN:  5 (01/06/2009)   Creatinine:  0.9 (01/06/2009)   Albumin:  4.1 (01/06/2009)   Total Protein:  7.9 (01/06/2009)   Calcium:  9.3 (01/06/2009)   Total Bili:  0.7 (01/06/2009)  Alk Phos:  35 (01/06/2009)   SGPT (ALT):  48 (01/06/2009)   SGOT (AST):  52 (01/06/2009)   Medications Prior to Update: 1)  Diazepam 10 Mg Tabs (Diazepam) .... Every 6 Hrs As Needed 2)  Methadone Hcl 40 Mg Tbso (Methadone Hcl) .... 3 Once Daily  Current Medications (verified): 1)  Diazepam 10 Mg Tabs (Diazepam) .... Every 6 Hrs As Needed 2)  Methadone Hcl 40 Mg Tbso (Methadone Hcl) .... 120mg  Every Am Dispensed By [facility]  Allergies (verified): 1)  ! Codeine 2)  ! Nsaids  Past History:  Past Medical History: Last updated: 09/08/2009 COPD GERD Osteoarthritis Seizure disorder  Past Surgical History: Last updated: 12/23/2007 Denies surgical history  Family History: Last updated: 12/23/2007 Family History Diabetes 1st degree relative Family History Hypertension  Social History: Last updated: 12/23/2007 Domestic Partner Current Smoker Alcohol use-yes Drug use-yes Regular exercise-no  Risk Factors: Alcohol Use: 2 (04/16/2010) >5 drinks/d w/in last 3 months: no (04/16/2010) Exercise: no (12/23/2007)  Risk Factors: Smoking Status:  current (04/16/2010) Packs/Day: 2 (04/16/2010) Cans of tobacco/wk: no (04/16/2010) Passive Smoke Exposure: yes (04/16/2010)  Family History: Reviewed history from 12/23/2007 and no changes required. Family History Diabetes 1st degree relative Family History Hypertension  Social History: Reviewed history from 12/23/2007 and no changes required. Domestic Partner Current Smoker Alcohol use-yes Drug use-yes Regular exercise-no  Review of Systems       The patient complains of weight gain.  The patient denies anorexia, fever, weight loss, chest pain, syncope, dyspnea on exertion, peripheral edema, prolonged cough, headaches, hemoptysis, abdominal pain, muscle weakness, suspicious skin lesions, difficulty walking, depression, abnormal bleeding, and enlarged lymph nodes.   MS:  Complains of joint pain and stiffness; denies joint redness, joint swelling, loss of strength, low back pain, mid back pain, and muscle aches. Neuro:  Denies disturbances in coordination, falling down, headaches, inability to speak, memory loss, numbness, poor balance, seizures, tingling, tremors, visual disturbances, and weakness.  Physical Exam  General:  alert, well-developed, well-nourished, well-hydrated, cooperative to examination, and good hygiene.   Head:  normocephalic, atraumatic, and no abnormalities observed.   Mouth:  Oral mucosa and oropharynx without lesions or exudates.  Teeth in good repair. Neck:  supple, full ROM, no masses, and no thyromegaly.   Lungs:  normal respiratory effort, no intercostal retractions, no accessory muscle use, normal breath sounds, and no dullness.   Heart:  normal rate, regular rhythm, no murmur, and no gallop.   Abdomen:  soft, non-tender, normal bowel sounds, no distention, no masses, no hepatomegaly, and no splenomegaly.   Msk:  normal ROM, no joint tenderness, no joint swelling, no joint warmth, no redness over joints, no joint deformities, no joint instability, and no  crepitation. he has palpable DJD in the feet, 1st MTP joint.  Pulses:  R and L carotid,radial,femoral,dorsalis pedis and posterior tibial pulses are full and equal bilaterally Extremities:  No clubbing, cyanosis, edema, or deformity noted with normal full range of motion of all joints.   Neurologic:  No cranial nerve deficits noted. Station and gait are normal. Plantar reflexes are down-going bilaterally. DTRs are symmetrical throughout. Sensory, motor and coordinative functions appear intact. Skin:  turgor normal, color normal, no rashes, no suspicious lesions, no ecchymoses, and no petechiae.   Cervical Nodes:  no anterior cervical adenopathy and no posterior cervical adenopathy.   Psych:  Cognition and judgment appear intact. Alert and cooperative with normal attention span and concentration. No apparent delusions, illusions, hallucinations   Impression &  Recommendations:  Problem # 1:  SEIZURE DISORDER (ICD-780.39) Assessment Improved  Labs Reviewed: Hgb: 14.0 (01/06/2009)   Hct: 41.2 (01/06/2009)   WBC: 7.7 (01/06/2009)   RBC: 4.10 (01/06/2009)   Plts: 256.0 (01/06/2009) SGOT: 52 (01/06/2009)   SGPT: 48 (01/06/2009)     Problem # 2:  OSTEOARTHRITIS (ICD-715.90) Assessment: Unchanged  His updated medication list for this problem includes:    Methadone Hcl 40 Mg Tbso (Methadone hcl) ..... 120mg  every am dispensed by [facility]  Complete Medication List: 1)  Diazepam 10 Mg Tabs (Diazepam) .... Every 6 hrs as needed 2)  Methadone Hcl 40 Mg Tbso (Methadone hcl) .... 120mg  every am dispensed by [facility]  Patient Instructions: 1)  Please schedule a follow-up appointment in 3 months. 2)  Tobacco is very bad for your health and your loved ones! You Should stop smoking!. 3)  Stop Smoking Tips: Choose a Quit date. Cut down before the Quit date. decide what you will do as a substitute when you feel the urge to smoke(gum,toothpick,exercise). 4)  Take  650-1000mg  of Tylenol every 4-6 hours as needed for relief of pain or comfort of fever AVOID taking more than 4000mg   in a 24 hour period (can cause liver damage in higher doses).   Orders Added: 1)  Est. Patient Level III [08657]

## 2010-07-03 ENCOUNTER — Other Ambulatory Visit: Payer: Self-pay | Admitting: Internal Medicine

## 2010-07-04 NOTE — Telephone Encounter (Signed)
Request for Valium 10mg  tablet. Last Refill: 03/08/2010 #60x3 Last OV:04/16/2010 Please advise in Dr. Yetta Barre' absence.

## 2010-07-17 ENCOUNTER — Encounter: Payer: Self-pay | Admitting: Internal Medicine

## 2010-07-18 ENCOUNTER — Ambulatory Visit: Payer: Self-pay | Admitting: Internal Medicine

## 2010-07-18 DIAGNOSIS — Z0289 Encounter for other administrative examinations: Secondary | ICD-10-CM

## 2010-08-02 ENCOUNTER — Encounter: Payer: Self-pay | Admitting: Internal Medicine

## 2010-08-02 ENCOUNTER — Ambulatory Visit (INDEPENDENT_AMBULATORY_CARE_PROVIDER_SITE_OTHER): Payer: Self-pay | Admitting: Internal Medicine

## 2010-08-02 DIAGNOSIS — R569 Unspecified convulsions: Secondary | ICD-10-CM

## 2010-08-02 DIAGNOSIS — M79609 Pain in unspecified limb: Secondary | ICD-10-CM

## 2010-08-02 NOTE — Progress Notes (Signed)
  Subjective:    Patient ID: Jacob Hill, male    DOB: 1970-09-10, 40 y.o.   MRN: [id number]  HPI He returns for f/up and he tells me that he quit smoking marijuana 2 weeks ago and has had a loss of appetite since then. He tells me that his counselor at [facility] has asked him to quit smoking marijuana.   Review of Systems  Constitutional: Positive for appetite change (some decrease). Negative for fever, chills, diaphoresis, activity change, fatigue and unexpected weight change.  HENT: Negative for sore throat, trouble swallowing and voice change.   Eyes: Negative.   Respiratory: Negative.   Cardiovascular: Negative for chest pain, palpitations and leg swelling.  Gastrointestinal: Positive for nausea. Negative for vomiting, abdominal pain, diarrhea, constipation, blood in stool, abdominal distention, anal bleeding and rectal pain.  Genitourinary: Negative for dysuria, urgency, frequency, hematuria, flank pain, decreased urine volume, enuresis and difficulty urinating.  Musculoskeletal: Positive for arthralgias (foot pain, as usual). Negative for myalgias, back pain, joint swelling and gait problem.  Skin: Negative for color change and pallor.  Neurological: Negative for dizziness, tremors, seizures, syncope, facial asymmetry, speech difficulty, weakness, light-headedness, numbness and headaches.  Hematological: Negative for adenopathy. Does not bruise/bleed easily.  Psychiatric/Behavioral: Negative for suicidal ideas, hallucinations, behavioral problems, confusion, sleep disturbance, self-injury, dysphoric mood, decreased concentration and agitation. The patient is nervous/anxious. The patient is not hyperactive.        Objective:   Physical Exam  Vitals reviewed. Constitutional: He is oriented to person, place, and time. He appears well-developed and well-nourished. No distress.  HENT:  Head: Normocephalic and atraumatic.  Right Ear: External ear normal.  Left Ear: External ear  normal.  Nose: Nose normal.  Mouth/Throat: No oropharyngeal exudate.  Eyes: Conjunctivae and EOM are normal. Pupils are equal, round, and reactive to light. Right eye exhibits no discharge. Left eye exhibits no discharge. No scleral icterus.  Neck: Normal range of motion. Neck supple. No JVD present. No tracheal deviation present. No thyromegaly present.  Cardiovascular: Normal rate, regular rhythm, normal heart sounds and intact distal pulses.  Exam reveals no gallop and no friction rub.   No murmur heard. Pulmonary/Chest: Effort normal and breath sounds normal. No stridor. No respiratory distress. He has no wheezes. He has no rales. He exhibits no tenderness.  Abdominal: Soft. Bowel sounds are normal. He exhibits no distension and no mass. There is no tenderness. There is no rebound and no guarding.  Musculoskeletal: Normal range of motion. He exhibits no edema and no tenderness.  Lymphadenopathy:    He has no cervical adenopathy.  Neurological: He is alert and oriented to person, place, and time. He has normal reflexes.  Skin: Skin is warm and dry. No rash noted. He is not diaphoretic. No erythema. No pallor.  Psychiatric: He has a normal mood and affect. His behavior is normal. Judgment and thought content normal.          Assessment & Plan:

## 2010-08-02 NOTE — Assessment & Plan Note (Signed)
No changes

## 2010-08-02 NOTE — Patient Instructions (Signed)
Arthritis - Degenerative, Osteoarthritis You have osteoarthritis. This is the wear and tear arthritis that comes with aging. It is also called degenerative arthritis. This is common in people past middle age. It is caused by stress on the joints from living. The large weight bearing joints of the lower extremities are most often affected. The knees, hips, back, neck, and hands can become painful, swollen, and stiff. This is the most common type of arthritis. It comes on with age, carrying too much weight, and from injury. Treatment includes resting the sore joint until the pain and swelling improve. Crutches or a walker may be needed for severe flares. Only take over-the-counter or prescription medicines for pain, discomfort, or fever as directed by your caregiver. Local heat therapy may improve motion. Cortisone shots into the joint are sometimes used to reduce pain and swelling during flares. Osteoarthritis is usually not crippling and progresses slowly. There are things you can do to decrease pain:  Avoid high impact activities.   Exercise regularly.   Low impact exercises such as walking, biking and swimming help to keep the muscles strong and keep normal joint function.   Stretching helps to keep your range of motion.   Lose weight if you are overweight. This reduces joint stress.  In severe cases when you have pain at rest or increasing disability, joint surgery may be helpful. See your caregiver for follow-up treatment as recommended.  SEEK IMMEDIATE MEDICAL CARE IF:  You have severe joint pain.   Marked swelling and redness in your joint develops.   You develop a high fever.  Document Released: 02/04/2005 Document Re-Released: 06/06/2007 [employer] Patient Information 2011 [employer]. 

## 2010-11-01 ENCOUNTER — Telehealth: Payer: Self-pay | Admitting: *Deleted

## 2010-11-01 MED ORDER — DIAZEPAM 10 MG PO TABS: 5.0000 mg | ORAL_TABLET | Freq: Four times a day (QID) | ORAL | Status: DC | PRN

## 2010-11-01 NOTE — Telephone Encounter (Signed)
Patient is req RF of valium. Next OV is 11/08/10.

## 2010-11-01 NOTE — Telephone Encounter (Signed)
Called in, Patient informed  

## 2010-11-01 NOTE — Telephone Encounter (Signed)
ok 

## 2010-11-08 ENCOUNTER — Ambulatory Visit (INDEPENDENT_AMBULATORY_CARE_PROVIDER_SITE_OTHER): Payer: Self-pay | Admitting: Internal Medicine

## 2010-11-08 ENCOUNTER — Encounter: Payer: Self-pay | Admitting: Internal Medicine

## 2010-11-08 VITALS — BP 130/92 | HR 80 | Temp 98.2°F | Resp 16 | Wt 178.0 lb

## 2010-11-08 DIAGNOSIS — R569 Unspecified convulsions: Secondary | ICD-10-CM

## 2010-11-08 DIAGNOSIS — M19079 Primary osteoarthritis, unspecified ankle and foot: Secondary | ICD-10-CM

## 2010-11-08 MED ORDER — DIAZEPAM 10 MG PO TABS: 5.0000 mg | ORAL_TABLET | Freq: Four times a day (QID) | ORAL | Status: DC | PRN

## 2010-11-08 NOTE — Patient Instructions (Signed)
Arthritis - Degenerative, Osteoarthritis You have osteoarthritis. This is the wear and tear arthritis that comes with aging. It is also called degenerative arthritis. This is common in people past middle age. It is caused by stress on the joints from living. The large weight bearing joints of the lower extremities are most often affected. The knees, hips, back, neck, and hands can become painful, swollen, and stiff. This is the most common type of arthritis. It comes on with age, carrying too much weight, and from injury. Treatment includes resting the sore joint until the pain and swelling improve. Crutches or a walker may be needed for severe flares. Only take over-the-counter or prescription medicines for pain, discomfort, or fever as directed by your caregiver. Local heat therapy may improve motion. Cortisone shots into the joint are sometimes used to reduce pain and swelling during flares. Osteoarthritis is usually not crippling and progresses slowly. There are things you can do to decrease pain:  Avoid high impact activities.   Exercise regularly.   Low impact exercises such as walking, biking and swimming help to keep the muscles strong and keep normal joint function.   Stretching helps to keep your range of motion.   Lose weight if you are overweight. This reduces joint stress.  In severe cases when you have pain at rest or increasing disability, joint surgery may be helpful. See your caregiver for follow-up treatment as recommended.  SEEK IMMEDIATE MEDICAL CARE IF:  You have severe joint pain.   Marked swelling and redness in your joint develops.   You develop a high fever.  Document Released: 02/04/2005 Document Re-Released: 06/06/2007 [employer] Patient Information 2011 [employer]. 

## 2010-11-08 NOTE — Assessment & Plan Note (Signed)
Ortho referral  

## 2010-11-08 NOTE — Progress Notes (Signed)
  Subjective:    Patient ID: Jacob Hill, male    DOB: [birth year], 40 y.o.   MRN: [id number]  HPI He returns for f/up and continues to complain of foot pain and wants to see if an ortho doctor will see him. Also, he is doing well on valium and has not had any seizures.    Review of Systems  Constitutional: Negative.   HENT: Negative.   Eyes: Negative.   Respiratory: Negative.   Cardiovascular: Negative.   Gastrointestinal: Negative.   Genitourinary: Negative.   Musculoskeletal: Positive for arthralgias (both 1st MTP joints). Negative for myalgias, back pain, joint swelling and gait problem.  Skin: Negative.   Neurological: Negative.   Hematological: Negative.   Psychiatric/Behavioral: Negative.        Objective:   Physical Exam  Vitals reviewed. Constitutional: He appears well-developed and well-nourished. No distress.  HENT:  Mouth/Throat: Oropharynx is clear and moist. No oropharyngeal exudate.  Eyes: Conjunctivae are normal. Right eye exhibits no discharge. Left eye exhibits no discharge. No scleral icterus.  Neck: Normal range of motion. Neck supple. No JVD present. No tracheal deviation present. No thyromegaly present.  Cardiovascular: Normal rate, regular rhythm, normal heart sounds and intact distal pulses.  Exam reveals no gallop and no friction rub.   No murmur heard. Pulmonary/Chest: Effort normal and breath sounds normal. No stridor. No respiratory distress. He has no wheezes. He has no rales. He exhibits no tenderness.  Abdominal: Soft. Bowel sounds are normal. He exhibits no distension. There is no tenderness. There is no rebound and no guarding.  Musculoskeletal:       Right foot: He exhibits tenderness (+ DJD in 1st MTP joint) and bony tenderness. He exhibits normal range of motion, no swelling, normal capillary refill, no crepitus, no deformity and no laceration.       Left foot: He exhibits tenderness (+ DJD in 1st MTP joint) and bony tenderness. He  exhibits normal range of motion, no swelling, normal capillary refill, no crepitus, no deformity and no laceration.  Lymphadenopathy:    He has no cervical adenopathy.  Skin: He is not diaphoretic.          Assessment & Plan:

## 2010-11-08 NOTE — Assessment & Plan Note (Signed)
Continue valium  

## 2011-02-07 ENCOUNTER — Ambulatory Visit: Payer: Self-pay | Admitting: Internal Medicine

## 2011-02-19 DIAGNOSIS — K7030 Alcoholic cirrhosis of liver without ascites: Secondary | ICD-10-CM

## 2011-02-19 HISTORY — DX: Alcoholic cirrhosis of liver without ascites: K70.30

## 2011-05-10 ENCOUNTER — Ambulatory Visit (INDEPENDENT_AMBULATORY_CARE_PROVIDER_SITE_OTHER): Payer: Self-pay | Admitting: Internal Medicine

## 2011-05-10 ENCOUNTER — Encounter: Payer: Self-pay | Admitting: Internal Medicine

## 2011-05-10 VITALS — BP 120/82 | HR 71 | Temp 99.9°F | Resp 16 | Wt 179.0 lb

## 2011-05-10 DIAGNOSIS — R569 Unspecified convulsions: Secondary | ICD-10-CM

## 2011-05-10 DIAGNOSIS — J449 Chronic obstructive pulmonary disease, unspecified: Secondary | ICD-10-CM

## 2011-05-10 DIAGNOSIS — M79609 Pain in unspecified limb: Secondary | ICD-10-CM

## 2011-05-10 DIAGNOSIS — M19079 Primary osteoarthritis, unspecified ankle and foot: Secondary | ICD-10-CM

## 2011-05-10 MED ORDER — DIAZEPAM 10 MG PO TABS: 5.0000 mg | ORAL_TABLET | Freq: Four times a day (QID) | ORAL | Status: DC | PRN

## 2011-05-10 NOTE — Assessment & Plan Note (Signed)
The wear and tear continues to build up and his pain worsens, he takes methadone for the pain. When he gets insurance he will see an ortho to consider surgery.

## 2011-05-10 NOTE — Progress Notes (Signed)
  Subjective:    Patient ID: Jacob Hill, male    DOB: 1970/07/27, 41 y.o.   MRN: [id number]  Arthritis Presents for follow-up visit. He complains of pain. He reports no stiffness, joint swelling or joint warmth. The symptoms have been worsening. Affected locations include the left foot and right foot. His pain is at a severity of 7/10. Associated symptoms include pain at night. Pertinent negatives include no pain while resting. Compliance with total regimen is 76-100%. Compliance with medications is 76-100%.      Review of Systems  Constitutional: Negative.   HENT: Negative.   Eyes: Negative.   Respiratory: Negative for cough, shortness of breath, wheezing and stridor.   Cardiovascular: Negative for chest pain, palpitations and leg swelling.  Gastrointestinal: Negative.   Genitourinary: Negative.   Musculoskeletal: Positive for arthralgias (both feet, worsening, he can't walk much) and arthritis. Negative for joint swelling and stiffness.  Neurological: Negative for dizziness, tremors, seizures, syncope, facial asymmetry, speech difficulty, weakness, light-headedness, numbness and headaches.  Hematological: Negative.   Psychiatric/Behavioral: Negative.        Objective:   Physical Exam  Vitals reviewed. Constitutional: He is oriented to person, place, and time. He appears well-developed and well-nourished. No distress.  HENT:  Head: Normocephalic and atraumatic.  Mouth/Throat: No oropharyngeal exudate.  Eyes: Conjunctivae and EOM are normal. Right eye exhibits no discharge. Left eye exhibits no discharge. No scleral icterus.  Neck: Normal range of motion. Neck supple. No JVD present. No tracheal deviation present. No thyromegaly present.  Cardiovascular: Normal rate, regular rhythm, normal heart sounds and intact distal pulses.  Exam reveals no gallop and no friction rub.   No murmur heard. Pulmonary/Chest: Effort normal and breath sounds normal. No stridor. No respiratory  distress. He has no wheezes. He has no rales. He exhibits no tenderness.  Abdominal: Soft. Bowel sounds are normal. He exhibits no distension and no mass. There is no tenderness. There is no rebound and no guarding.  Musculoskeletal: Normal range of motion. He exhibits tenderness. He exhibits no edema.       Right foot: He exhibits deformity (he has spurring in his 1st MTP joint). He exhibits normal range of motion, no tenderness, no bony tenderness, no swelling, normal capillary refill, no crepitus and no laceration.       Left foot: He exhibits deformity (he has spurring in his 1st MTP joint). He exhibits normal range of motion, no tenderness, no bony tenderness, no swelling, normal capillary refill, no crepitus and no laceration.  Lymphadenopathy:    He has no cervical adenopathy.  Neurological: He is alert and oriented to person, place, and time. He displays normal reflexes. No cranial nerve deficit. He exhibits normal muscle tone. Coordination normal.  Skin: Skin is warm and dry. No rash noted. He is not diaphoretic. No erythema. No pallor.  Psychiatric: He has a normal mood and affect. His behavior is normal. Judgment and thought content normal.          Assessment & Plan:

## 2011-05-10 NOTE — Patient Instructions (Signed)
Degenerative Arthritis You have osteoarthritis. This is the wear and tear arthritis that comes with aging. It is also called degenerative arthritis. This is common in people past middle age. It is caused by stress on the joints. The large weight bearing joints of the lower extremities are most often affected. The knees, hips, back, neck, and hands can become painful, swollen, and stiff. This is the most common type of arthritis. It comes on with age, carrying too much weight, or from an injury. Treatment includes resting the sore joint until the pain and swelling improve. Crutches or a walker may be needed for severe flares. Only take over-the-counter or prescription medicines for pain, discomfort, or fever as directed by your caregiver. Local heat therapy may improve motion. Cortisone shots into the joint are sometimes used to reduce pain and swelling during flares. Osteoarthritis is usually not crippling and progresses slowly. There are things you can do to decrease pain:  Avoid high impact activities.   Exercise regularly.   Low impact exercises such as walking, biking and swimming help to keep the muscles strong and keep normal joint function.   Stretching helps to keep your range of motion.   Lose weight if you are overweight. This reduces joint stress.  In severe cases when you have pain at rest or increasing disability, joint surgery may be helpful. See your caregiver for follow-up treatment as recommended.  SEEK IMMEDIATE MEDICAL CARE IF:   You have severe joint pain.   Marked swelling and redness in your joint develops.   You develop a high fever.  Document Released: 02/04/2005 Document Revised: 01/24/2011 Document Reviewed: 07/07/2006 [facility] Patient Information 2012 [place], [place].Seizures You had a seizure. About 2% of the population will have a seizure problem during their lifetime. Sometimes the cause for the seizure is not known. Seizures are usually associated with one of  these problems:  Epilepsy.   Not taking your seizure medicine.   Alcohol and drug abuse.   Head injury, strokes, tumors, and brain surgery.   High fever and infections.   Low blood sugar.  Evaluating a new seizure disorder may require having a brain scan or a brain wave test called an EEG. If you have been given a seizure medicine, it is very important that you take it as prescribed. Not taking these medicines as directed is the most common cause of seizures. Blood tests are often used to be sure you are taking the proper dose.  Seizures cause many different symptoms, from convulsions to brief blackouts. Do not ride a bike, drive a car, go swimming, climb in high or dangerous places such as ladders or roofs, or operate any dangerous equipment until you have your doctor's permission. If you hold a driver's license, state law may require that a report be made to the motor vehicles department. You should wear an emergency medical identification bracelet with information about your seizures. If you have any warning that a seizure may occur, lie down in a safe place to protect yourself. Teach your family and friends what to do if you have any further seizures. They should stay calm and try to keep you from falling on hard or sharp objects. It is best not to try to restrain a seizing person or to force anything into his or her mouth. Do not try to open clenched jaws. When the seizure is over, the person should be rolled on their side to help drain any vomit or secretions from the mouth. After a seizure,  a person may be confused or drowsy for several minutes. An ambulance should be called if the seizure lasted more than 5 minutes or if confusion remains for more than 30 minutes. Call your caregiver or the emergency department for further instructions. Do not drive until cleared by your caregiver or neurologist! Document Released: 03/14/2004 Document Revised: 10/17/2010 Document Reviewed:  02/04/2005 [facility] Patient Information 2012 [place], [place].Smoking Cessation This document explains the best ways for you to quit smoking and new treatments to help. It lists new medicines that can double or triple your chances of quitting and quitting for good. It also considers ways to avoid relapses and concerns you may have about quitting, including weight gain. NICOTINE: A POWERFUL ADDICTION If you have tried to quit smoking, you know how hard it can be. It is hard because nicotine is a very addictive drug. For some people, it can be as addictive as heroin or cocaine. Usually, people make 2 or 3 tries, or more, before finally being able to quit. Each time you try to quit, you can learn about what helps and what hurts. Quitting takes hard work and a lot of effort, but you can quit smoking. QUITTING SMOKING IS ONE OF THE MOST IMPORTANT THINGS YOU WILL EVER DO.  You will live longer, feel better, and live better.   The impact on your body of quitting smoking is felt almost immediately:   Within 20 minutes, blood pressure decreases. Pulse returns to its normal level.   After 8 hours, carbon monoxide levels in the blood return to normal. Oxygen level increases.   After 24 hours, chance of heart attack starts to decrease. Breath, hair, and body stop smelling like smoke.   After 48 hours, damaged nerve endings begin to recover. Sense of taste and smell improve.   After 72 hours, the body is virtually free of nicotine. Bronchial tubes relax and breathing becomes easier.   After 2 to 12 weeks, lungs can hold more air. Exercise becomes easier and circulation improves.   Quitting will reduce your risk of having a heart attack, stroke, cancer, or lung disease:   After 1 year, the risk of coronary heart disease is cut in half.   After 5 years, the risk of stroke falls to the same as a nonsmoker.   After 10 years, the risk of lung cancer is cut in half and the risk of other cancers decreases  significantly.   After 15 years, the risk of coronary heart disease drops, usually to the level of a nonsmoker.   If you are pregnant, quitting smoking will improve your chances of having a healthy baby.   The people you live with, especially your children, will be healthier.   You will have extra money to spend on things other than cigarettes.  FIVE KEYS TO QUITTING Studies have shown that these 5 steps will help you quit smoking and quit for good. You have the best chances of quitting if you use them together: 1. Get ready.  2. Get support and encouragement.  3. Learn new skills and behaviors.  4. Get medicine to reduce your nicotine addiction and use it correctly.  5. Be prepared for relapse or difficult situations. Be determined to continue trying to quit, even if you do not succeed at first.  1. GET READY  Set a quit date.   Change your environment.   Get rid of ALL cigarettes, ashtrays, matches, and lighters in your home, car, and place of work.  Do not let people smoke in your home.   Review your past attempts to quit. Think about what worked and what did not.   Once you quit, do not smoke. NOT EVEN A PUFF!  2. GET SUPPORT AND ENCOURAGEMENT Studies have shown that you have a better chance of being successful if you have help. You can get support in many ways.  Tell your family, friends, and coworkers that you are going to quit and need their support. Ask them not to smoke around you.   Talk to your caregivers (doctor, dentist, nurse, pharmacist, psychologist, and/or smoking counselor).   Get individual, group, or telephone counseling and support. The more counseling you have, the better your chances are of quitting. Programs are available at [employer] and health centers. Call your local health department for information about programs in your area.   Spiritual beliefs and practices may help some smokers quit.   Quit meters are Estate agent that keep track of quit statistics, such as amount of "quit-time," cigarettes not smoked, and money saved.   Many smokers find one or more of the many self-help books available useful in helping them quit and stay off tobacco.  3. LEARN NEW SKILLS AND BEHAVIORS  Try to distract yourself from urges to smoke. Talk to someone, go for a walk, or occupy your time with a task.   When you first try to quit, change your routine. Take a different route to work. Drink tea instead of coffee. Eat breakfast in a different place.   Do something to reduce your stress. Take a hot bath, exercise, or read a book.   Plan something enjoyable to do every day. Reward yourself for not smoking.   Explore interactive web-based programs that specialize in helping you quit.  4. GET MEDICINE AND USE IT CORRECTLY Medicines can help you stop smoking and decrease the urge to smoke. Combining medicine with the above behavioral methods and support can quadruple your chances of successfully quitting smoking. The U.S. Food and Drug Administration (FDA) has approved 7 medicines to help you quit smoking. These medicines fall into 3 categories.  Nicotine replacement therapy (delivers nicotine to your body without the negative effects and risks of smoking):   Nicotine gum: Available over-the-counter.   Nicotine lozenges: Available over-the-counter.   Nicotine inhaler: Available by prescription.   Nicotine nasal spray: Available by prescription.   Nicotine skin patches (transdermal): Available by prescription and over-the-counter.   Antidepressant medicine (helps people abstain from smoking, but how this works is unknown):   Bupropion sustained-release (SR) tablets: Available by prescription.   Nicotinic receptor partial agonist (simulates the effect of nicotine in your brain):   Varenicline tartrate tablets: Available by prescription.   Ask your caregiver for advice about which medicines to use and how  to use them. Carefully read the information on the package.   Everyone who is trying to quit may benefit from using a medicine. If you are pregnant or trying to become pregnant, nursing an infant, you are under age 28, or you smoke fewer than 10 cigarettes per day, talk to your caregiver before taking any nicotine replacement medicines.   You should stop using a nicotine replacement product and call your caregiver if you experience nausea, dizziness, weakness, vomiting, fast or irregular heartbeat, mouth problems with the lozenge or gum, or redness or swelling of the skin around the patch that does not go away.   Do not use any other product  containing nicotine while using a nicotine replacement product.   Talk to your caregiver before using these products if you have diabetes, heart disease, asthma, stomach ulcers, you had a recent heart attack, you have high blood pressure that is not controlled with medicine, a history of irregular heartbeat, or you have been prescribed medicine to help you quit smoking.  5. BE PREPARED FOR RELAPSE OR DIFFICULT SITUATIONS  Most relapses occur within the first 3 months after quitting. Do not be discouraged if you start smoking again. Remember, most people try several times before they finally quit.   You may have symptoms of withdrawal because your body is used to nicotine. You may crave cigarettes, be irritable, feel very hungry, cough often, get headaches, or have difficulty concentrating.   The withdrawal symptoms are only temporary. They are strongest when you first quit, but they will go away within 10 to 14 days.  Here are some difficult situations to watch for:  Alcohol. Avoid drinking alcohol. Drinking lowers your chances of successfully quitting.   Caffeine. Try to reduce the amount of caffeine you consume. It also lowers your chances of successfully quitting.   Other smokers. Being around smoking can make you want to smoke. Avoid smokers.   Weight  gain. Many smokers will gain weight when they quit, usually less than 10 pounds. Eat a healthy diet and stay active. Do not let weight gain distract you from your main goal, quitting smoking. Some medicines that help you quit smoking may also help delay weight gain. You can always lose the weight gained after you quit.   Bad mood or depression. There are a lot of ways to improve your mood other than smoking.  If you are having problems with any of these situations, talk to your caregiver. SPECIAL SITUATIONS AND CONDITIONS Studies suggest that everyone can quit smoking. Your situation or condition can give you a special reason to quit.  Pregnant women/new mothers: By quitting, you protect your baby's health and your own.   Hospitalized patients: By quitting, you reduce health problems and help healing.   Heart attack patients: By quitting, you reduce your risk of a second heart attack.   Lung, head, and neck cancer patients: By quitting, you reduce your chance of a second cancer.   Parents of children and adolescents: By quitting, you protect your children from illnesses caused by secondhand smoke.  QUESTIONS TO THINK ABOUT Think about the following questions before you try to stop smoking. You may want to talk about your answers with your caregiver.  Why do you want to quit?   If you tried to quit in the past, what helped and what did not?   What will be the most difficult situations for you after you quit? How will you plan to handle them?   Who can help you through the tough times? Your family? Friends? Caregiver?   What pleasures do you get from smoking? What ways can you still get pleasure if you quit?  Here are some questions to ask your caregiver:  How can you help me to be successful at quitting?   What medicine do you think would be best for me and how should I take it?   What should I do if I need more help?   What is smoking withdrawal like? How can I get information on  withdrawal?  Quitting takes hard work and a lot of effort, but you can quit smoking. FOR MORE INFORMATION  Smokefree.gov (http://www.davis-sullivan.com/) provides  free, accurate, evidence-based information and professional assistance to help support the immediate and long-term needs of people trying to quit smoking. Document Released: 01/29/2001 Document Revised: 01/24/2011 Document Reviewed: 11/21/2008 [facility] Patient Information 2012 [place], [place].

## 2011-05-10 NOTE — Assessment & Plan Note (Signed)
He has not had any recurrence of seizures and will continue valium as needed

## 2011-05-10 NOTE — Assessment & Plan Note (Signed)
Slowly worsening

## 2011-05-10 NOTE — Assessment & Plan Note (Signed)
unchanged

## 2011-05-21 ENCOUNTER — Telehealth: Payer: Self-pay | Admitting: Internal Medicine

## 2011-05-21 NOTE — Telephone Encounter (Signed)
No, I have been over this with him before

## 2011-05-21 NOTE — Telephone Encounter (Signed)
Pt called to request an rx for Marinol.  He is using Marijuana to help his appetite and for nausea.  He wants to use the Marinol instead.  He has no appetite.  He is trying to get disability also.

## 2011-05-22 NOTE — Telephone Encounter (Signed)
PT IS AWARE

## 2011-07-04 ENCOUNTER — Telehealth: Payer: Self-pay | Admitting: Internal Medicine

## 2011-07-04 NOTE — Telephone Encounter (Signed)
Pt states he has lost his diazapam.  He does not know where they are.  He is requesting more.

## 2011-07-04 NOTE — Telephone Encounter (Signed)
Pharmacy notified early refill ok

## 2011-07-04 NOTE — Telephone Encounter (Signed)
Per pharmacist Barbara Cower, Rx written 03/22 was filled on 04/11 and 05/13. Pt initially stated that he lost Rx but later said he misspoke and that he lost tablets. He said that they are usually on his night stand in his bedroom but he cannot find them, no one has been in his home recently. Please advise if it is okay to authorize early refill for this pt in Dr Yetta Barre' absence, thanks!

## 2011-07-04 NOTE — Telephone Encounter (Signed)
[place] controlled substance registry reviewed and added to EMR  Valium is for siezure d/o  No prior or current evidence for drug diversion or misuses  OK for early refill - robin to notify pharmacy

## 2011-09-09 ENCOUNTER — Ambulatory Visit (INDEPENDENT_AMBULATORY_CARE_PROVIDER_SITE_OTHER): Payer: Self-pay | Admitting: Internal Medicine

## 2011-09-09 ENCOUNTER — Encounter: Payer: Self-pay | Admitting: Internal Medicine

## 2011-09-09 VITALS — BP 110/70 | HR 88 | Temp 98.5°F | Resp 20 | Wt 177.5 lb

## 2011-09-09 DIAGNOSIS — Z23 Encounter for immunization: Secondary | ICD-10-CM

## 2011-09-09 DIAGNOSIS — M19079 Primary osteoarthritis, unspecified ankle and foot: Secondary | ICD-10-CM

## 2011-09-09 DIAGNOSIS — Z0000 Encounter for general adult medical examination without abnormal findings: Secondary | ICD-10-CM

## 2011-09-09 DIAGNOSIS — R569 Unspecified convulsions: Secondary | ICD-10-CM

## 2011-09-09 DIAGNOSIS — G609 Hereditary and idiopathic neuropathy, unspecified: Secondary | ICD-10-CM

## 2011-09-09 MED ORDER — DIAZEPAM 10 MG PO TABS: 5.0000 mg | ORAL_TABLET | Freq: Four times a day (QID) | ORAL | Status: DC | PRN

## 2011-09-09 NOTE — Progress Notes (Signed)
Subjective:    Patient ID: Jacob Hill, male    DOB: [birth year], 41 y.o.   MRN: [id number]  HPI  He returns for a complete physical and he offers no new or worsening complaints.   Review of Systems  Constitutional: Negative.  Negative for fever, chills, diaphoresis, activity change, appetite change, fatigue and unexpected weight change.  HENT: Negative.   Eyes: Negative.   Respiratory: Negative for cough, chest tightness, shortness of breath, wheezing and stridor.   Cardiovascular: Negative for chest pain, palpitations and leg swelling.  Gastrointestinal: Negative for nausea, vomiting, abdominal pain, diarrhea, constipation and blood in stool.  Genitourinary: Negative.   Musculoskeletal: Positive for arthralgias (both feet). Negative for myalgias, back pain, joint swelling and gait problem.  Skin: Negative.   Neurological: Positive for numbness (and burning in both feet). Negative for dizziness, tremors, seizures, syncope, facial asymmetry, speech difficulty, weakness, light-headedness and headaches.  Hematological: Negative for adenopathy. Does not bruise/bleed easily.  Psychiatric/Behavioral: Negative for suicidal ideas, hallucinations, behavioral problems, confusion, disturbed wake/sleep cycle, self-injury, dysphoric mood and decreased concentration. The patient is nervous/anxious. The patient is not hyperactive.        Objective:   Physical Exam  Vitals reviewed. Constitutional: He is oriented to person, place, and time. He appears well-developed and well-nourished. No distress.  HENT:  Head: Normocephalic and atraumatic.  Mouth/Throat: Oropharynx is clear and moist. No oropharyngeal exudate.  Eyes: Conjunctivae are normal. Right eye exhibits no discharge. Left eye exhibits no discharge. No scleral icterus.  Neck: Normal range of motion. Neck supple. No JVD present. No tracheal deviation present. No thyromegaly present.  Cardiovascular: Normal rate, regular rhythm,  normal heart sounds and intact distal pulses.  Exam reveals no gallop and no friction rub.   No murmur heard. Pulmonary/Chest: Effort normal and breath sounds normal. No stridor. No respiratory distress. He has no wheezes. He has no rales. He exhibits no tenderness.  Abdominal: Soft. Bowel sounds are normal. He exhibits no distension and no mass. There is no tenderness. There is no rebound and no guarding. Hernia confirmed negative in the right inguinal area and confirmed negative in the left inguinal area.  Genitourinary: Testes normal and penis normal. Right testis shows no mass, no swelling and no tenderness. Right testis is descended. Left testis shows no mass, no swelling and no tenderness. Left testis is descended. Circumcised. No penile tenderness. No discharge found.  Musculoskeletal: Normal range of motion. He exhibits no edema and no tenderness.       Right foot: Normal. He exhibits normal range of motion, no tenderness, no bony tenderness, no swelling, normal capillary refill, no crepitus, no deformity and no laceration.       Left foot: Normal. He exhibits normal range of motion, no tenderness, no bony tenderness, no swelling, normal capillary refill, no crepitus, no deformity and no laceration.  Lymphadenopathy:    He has no cervical adenopathy.       Right: No inguinal adenopathy present.       Left: No inguinal adenopathy present.  Neurological: He is alert and oriented to person, place, and time. He has normal strength. He displays normal reflexes. No cranial nerve deficit or sensory deficit. He displays a negative Romberg sign. He displays no Babinski's sign on the right side. He displays no Babinski's sign on the left side.  Reflex Scores:      Tricep reflexes are 1+ on the right side and 1+ on the left side.      Bicep  reflexes are 1+ on the right side and 1+ on the left side.      Brachioradialis reflexes are 1+ on the right side and 1+ on the left side.      Patellar reflexes  are 1+ on the right side and 1+ on the left side.      Achilles reflexes are 1+ on the right side and 1+ on the left side. Skin: Skin is warm and dry. No rash noted. He is not diaphoretic. No erythema. No pallor.  Psychiatric: He has a normal mood and affect. His behavior is normal. Judgment and thought content normal.          Assessment & Plan:

## 2011-09-09 NOTE — Assessment & Plan Note (Addendum)
Continue valium bid, I will check his UDS today

## 2011-09-09 NOTE — Assessment & Plan Note (Signed)
Exam done, labs ordered, vaccines were updated, pt ed material was given 

## 2011-09-09 NOTE — Patient Instructions (Signed)
Health Maintenance, Males A healthy lifestyle and preventative care can promote health and wellness.  Maintain regular health, dental, and eye exams.   Eat a healthy diet. Foods like vegetables, fruits, whole grains, low-fat dairy products, and lean protein foods contain the nutrients you need without too many calories. Decrease your intake of foods high in solid fats, added sugars, and salt. Get information about a proper diet from your caregiver, if necessary.   Regular physical exercise is one of the most important things you can do for your health. Most adults should get at least 150 minutes of moderate-intensity exercise (any activity that increases your heart rate and causes you to sweat) each week. In addition, most adults need muscle-strengthening exercises on 2 or more days a week.    Maintain a healthy weight. The body mass index (BMI) is a screening tool to identify possible weight problems. It provides an estimate of body fat based on height and weight. Your caregiver can help determine your BMI, and can help you achieve or maintain a healthy weight. For adults 20 years and older:   A BMI below 18.5 is considered underweight.   A BMI of 18.5 to 24.9 is normal.   A BMI of 25 to 29.9 is considered overweight.   A BMI of 30 and above is considered obese.   Maintain normal blood lipids and cholesterol by exercising and minimizing your intake of saturated fat. Eat a balanced diet with plenty of fruits and vegetables. Blood tests for lipids and cholesterol should begin at age 20 and be repeated every 5 years. If your lipid or cholesterol levels are high, you are over 50, or you are a high risk for heart disease, you may need your cholesterol levels checked more frequently.Ongoing high lipid and cholesterol levels should be treated with medicines, if diet and exercise are not effective.   If you smoke, find out from your caregiver how to quit. If you do not use tobacco, do not start.    If you choose to drink alcohol, do not exceed 2 drinks per day. One drink is considered to be 12 ounces (355 mL) of beer, 5 ounces (148 mL) of wine, or 1.5 ounces (44 mL) of liquor.   Avoid use of street drugs. Do not share needles with anyone. Ask for help if you need support or instructions about stopping the use of drugs.   High blood pressure causes heart disease and increases the risk of stroke. Blood pressure should be checked at least every 1 to 2 years. Ongoing high blood pressure should be treated with medicines if weight loss and exercise are not effective.   If you are 45 to 41 years old, ask your caregiver if you should take aspirin to prevent heart disease.   Diabetes screening involves taking a blood sample to check your fasting blood sugar level. This should be done once every 3 years, after age 45, if you are within normal weight and without risk factors for diabetes. Testing should be considered at a younger age or be carried out more frequently if you are overweight and have at least 1 risk factor for diabetes.   Colorectal cancer can be detected and often prevented. Most routine colorectal cancer screening begins at the age of 50 and continues through age 75. However, your caregiver may recommend screening at an earlier age if you have risk factors for colon cancer. On a yearly basis, your caregiver may provide home test kits to check for hidden   blood in the stool. Use of a small camera at the end of a tube, to directly examine the colon (sigmoidoscopy or colonoscopy), can detect the earliest forms of colorectal cancer. Talk to your caregiver about this at age 50, when routine screening begins. Direct examination of the colon should be repeated every 5 to 10 years through age 75, unless early forms of pre-cancerous polyps or small growths are found.   Hepatitis C blood testing is recommended for all people born from 1945 through 1965 and any individual with known risks for  hepatitis C.   Healthy men should no longer receive prostate-specific antigen (PSA) blood tests as part of routine cancer screening. Consult with your caregiver about prostate cancer screening.   Testicular cancer screening is not recommended for adolescents or adult males who have no symptoms. Screening includes self-exam, caregiver exam, and other screening tests. Consult with your caregiver about any symptoms you have or any concerns you have about testicular cancer.   Practice safe sex. Use condoms and avoid high-risk sexual practices to reduce the spread of sexually transmitted infections (STIs).   Use sunscreen with a sun protection factor (SPF) of 30 or greater. Apply sunscreen liberally and repeatedly throughout the day. You should seek shade when your shadow is shorter than you. Protect yourself by wearing long sleeves, pants, a wide-brimmed hat, and sunglasses year round, whenever you are outdoors.   Notify your caregiver of new moles or changes in moles, especially if there is a change in shape or color. Also notify your caregiver if a mole is larger than the size of a pencil eraser.   A one-time screening for abdominal aortic aneurysm (AAA) and surgical repair of large AAAs by sound wave imaging (ultrasonography) is recommended for ages 65 to 75 years who are current or former smokers.   Stay current with your immunizations.  Document Released: 08/03/2007 Document Revised: 01/24/2011 Document Reviewed: 07/02/2010 [employer] Patient Information 2012 [employer]. 

## 2011-09-19 DIAGNOSIS — D689 Coagulation defect, unspecified: Secondary | ICD-10-CM

## 2011-09-19 HISTORY — DX: Coagulation defect, unspecified: D68.9

## 2011-11-21 DIAGNOSIS — Z87442 Personal history of urinary calculi: Secondary | ICD-10-CM | POA: Insufficient documentation

## 2011-11-21 DIAGNOSIS — F419 Anxiety disorder, unspecified: Secondary | ICD-10-CM | POA: Insufficient documentation

## 2011-11-21 DIAGNOSIS — G40909 Epilepsy, unspecified, not intractable, without status epilepticus: Secondary | ICD-10-CM | POA: Insufficient documentation

## 2011-12-11 ENCOUNTER — Other Ambulatory Visit: Payer: Self-pay | Admitting: Internal Medicine

## 2011-12-11 ENCOUNTER — Ambulatory Visit: Payer: Self-pay | Admitting: Internal Medicine

## 2011-12-19 ENCOUNTER — Ambulatory Visit: Payer: Self-pay | Admitting: Internal Medicine

## 2011-12-25 ENCOUNTER — Ambulatory Visit: Payer: Self-pay | Admitting: Internal Medicine

## 2012-01-13 ENCOUNTER — Other Ambulatory Visit (INDEPENDENT_AMBULATORY_CARE_PROVIDER_SITE_OTHER): Payer: Self-pay

## 2012-01-13 ENCOUNTER — Encounter: Payer: Self-pay | Admitting: Internal Medicine

## 2012-01-13 ENCOUNTER — Ambulatory Visit (INDEPENDENT_AMBULATORY_CARE_PROVIDER_SITE_OTHER): Payer: Self-pay | Admitting: Internal Medicine

## 2012-01-13 VITALS — BP 138/94 | HR 129 | Temp 98.6°F | Resp 16 | Ht 72.0 in | Wt 170.2 lb

## 2012-01-13 DIAGNOSIS — R05 Cough: Secondary | ICD-10-CM | POA: Insufficient documentation

## 2012-01-13 DIAGNOSIS — R059 Cough, unspecified: Secondary | ICD-10-CM

## 2012-01-13 DIAGNOSIS — R569 Unspecified convulsions: Secondary | ICD-10-CM

## 2012-01-13 DIAGNOSIS — R634 Abnormal weight loss: Secondary | ICD-10-CM

## 2012-01-13 DIAGNOSIS — R21 Rash and other nonspecific skin eruption: Secondary | ICD-10-CM

## 2012-01-13 DIAGNOSIS — M19079 Primary osteoarthritis, unspecified ankle and foot: Secondary | ICD-10-CM

## 2012-01-13 DIAGNOSIS — N342 Other urethritis: Secondary | ICD-10-CM

## 2012-01-13 LAB — URINALYSIS, ROUTINE W REFLEX MICROSCOPIC
Hgb urine dipstick: NEGATIVE
Nitrite: POSITIVE
Specific Gravity, Urine: >=1.03 (ref 1.000–1.030)
Total Protein, Urine: 30
Urine Glucose: NEGATIVE
Urobilinogen, UA: >=8 (ref 0.0–1.0)
pH: 5.5 (ref 5.0–8.0)

## 2012-01-13 LAB — LIPID PANEL
Cholesterol: 189 mg/dL (ref 0–200)
HDL: 13.5 mg/dL — ABNORMAL LOW (ref >39.00)
LDL Cholesterol: 138 mg/dL — ABNORMAL HIGH (ref 0–99)
Total CHOL/HDL Ratio: 14
Triglycerides: 188 mg/dL — ABNORMAL HIGH (ref 0.0–149.0)
VLDL: 37.6 mg/dL (ref 0.0–40.0)

## 2012-01-13 LAB — CBC WITH DIFFERENTIAL/PLATELET
Basophils Absolute: 0 10*3/uL (ref 0.0–0.1)
Basophils Relative: 0.3 % (ref 0.0–3.0)
Eosinophils Absolute: 0.1 10*3/uL (ref 0.0–0.7)
Eosinophils Relative: 0.8 % (ref 0.0–5.0)
HCT: 41.4 % (ref 39.0–52.0)
Hemoglobin: 13.8 g/dL (ref 13.0–17.0)
Lymphocytes Relative: 23.7 % (ref 12.0–46.0)
Lymphs Abs: 2.6 10*3/uL (ref 0.7–4.0)
MCHC: 33.3 g/dL (ref 30.0–36.0)
MCV: 112.3 fl — ABNORMAL HIGH (ref 78.0–100.0)
Monocytes Absolute: 0.9 10*3/uL (ref 0.1–1.0)
Monocytes Relative: 8.6 % (ref 3.0–12.0)
Neutro Abs: 7.3 10*3/uL (ref 1.4–7.7)
Neutrophils Relative %: 66.6 % (ref 43.0–77.0)
Platelets: 198 10*3/uL (ref 150.0–400.0)
RBC: 3.68 Mil/uL — ABNORMAL LOW (ref 4.22–5.81)
RDW: 14.5 % (ref 11.5–14.6)
WBC: 10.9 10*3/uL — ABNORMAL HIGH (ref 4.5–10.5)

## 2012-01-13 LAB — COMPREHENSIVE METABOLIC PANEL
ALT: 59 U/L — ABNORMAL HIGH (ref 0–53)
AST: 172 U/L — ABNORMAL HIGH (ref 0–37)
Albumin: 3.2 g/dL — ABNORMAL LOW (ref 3.5–5.2)
Alkaline Phosphatase: 130 U/L — ABNORMAL HIGH (ref 39–117)
BUN: 6 mg/dL (ref 6–23)
CO2: 27 mEq/L (ref 19–32)
Calcium: 9.3 mg/dL (ref 8.4–10.5)
Chloride: 96 mEq/L (ref 96–112)
Creatinine, Ser: 0.9 mg/dL (ref 0.4–1.5)
GFR: 104.09 mL/min (ref >60.00)
Glucose, Bld: 119 mg/dL — ABNORMAL HIGH (ref 70–99)
Potassium: 3.5 mEq/L (ref 3.5–5.1)
Sodium: 134 mEq/L — ABNORMAL LOW (ref 135–145)
Total Bilirubin: 2.5 mg/dL — ABNORMAL HIGH (ref 0.3–1.2)
Total Protein: 9.4 g/dL — ABNORMAL HIGH (ref 6.0–8.3)

## 2012-01-13 LAB — AMYLASE: Amylase: 36 U/L (ref 27–131)

## 2012-01-13 LAB — TSH: TSH: 5.21 u[IU]/mL (ref 0.35–5.50)

## 2012-01-13 LAB — RPR

## 2012-01-13 NOTE — Assessment & Plan Note (Signed)
CXR and labs today to see if there is organic illness to explain his weight loss, will also check a UDS

## 2012-01-13 NOTE — Assessment & Plan Note (Signed)
I will check his CXR today 

## 2012-01-13 NOTE — Assessment & Plan Note (Signed)
Labs today to look for secondary causes 

## 2012-01-13 NOTE — Assessment & Plan Note (Addendum)
No seizures reported, I will check his UDS today to see that he is taking the valium

## 2012-01-13 NOTE — Patient Instructions (Signed)

## 2012-01-13 NOTE — Progress Notes (Signed)
Subjective:    Patient ID: Jacob Hill, male    DOB: [birth year], 41 y.o.   MRN: [id number]  Cough This is a chronic problem. The current episode started more than 1 year ago. The problem has been gradually worsening. The problem occurs every few hours. The cough is non-productive. Associated symptoms include a rash and weight loss. Pertinent negatives include no chest pain, chills, ear congestion, ear pain, fever, headaches, heartburn, hemoptysis, myalgias, nasal congestion, postnasal drip, sore throat, shortness of breath, sweats or wheezing. The symptoms are aggravated by cold air. He has tried nothing for the symptoms. His past medical history is significant for COPD.      Review of Systems  Constitutional: Positive for weight loss, fatigue and unexpected weight change (7 pound weight loss). Negative for fever, chills, diaphoresis, activity change and appetite change.  HENT: Negative.  Negative for ear pain, sore throat and postnasal drip.   Eyes: Negative.   Respiratory: Positive for cough. Negative for apnea, hemoptysis, choking, chest tightness, shortness of breath, wheezing and stridor.   Cardiovascular: Negative for chest pain, palpitations and leg swelling.  Gastrointestinal: Positive for nausea and vomiting (for 3 months, 1-2 times per day). Negative for heartburn, abdominal pain, diarrhea, constipation, blood in stool, abdominal distention, anal bleeding and rectal pain.  Genitourinary: Negative.   Musculoskeletal: Positive for arthralgias (feet). Negative for myalgias, back pain, joint swelling and gait problem.  Skin: Positive for rash. Negative for color change, pallor and wound.  Neurological: Negative for dizziness, tremors, seizures, syncope, facial asymmetry, speech difficulty, weakness, light-headedness, numbness and headaches.  Hematological: Negative for adenopathy. Does not bruise/bleed easily.  Psychiatric/Behavioral: Negative for suicidal ideas, hallucinations,  behavioral problems, confusion, sleep disturbance, self-injury, dysphoric mood, decreased concentration and agitation. The patient is nervous/anxious. The patient is not hyperactive.        Objective:   Physical Exam  Constitutional: He is oriented to person, place, and time. Vital signs are normal. He appears well-developed and well-nourished.  Non-toxic appearance. He does not have a sickly appearance. He appears ill (loss of weight). No distress.  HENT:  Head: Normocephalic and atraumatic.  Mouth/Throat: Oropharynx is clear and moist. No oropharyngeal exudate.  Eyes: Conjunctivae normal are normal. Right eye exhibits no discharge. Left eye exhibits no discharge. No scleral icterus.  Neck: Normal range of motion. Neck supple. No JVD present. No tracheal deviation present. No thyromegaly present.  Cardiovascular: Normal rate, regular rhythm, normal heart sounds and intact distal pulses.  Exam reveals no gallop and no friction rub.   No murmur heard. Pulmonary/Chest: Effort normal and breath sounds normal. No stridor. No respiratory distress. He has no wheezes. He has no rales. He exhibits no tenderness.  Abdominal: Soft. Bowel sounds are normal. He exhibits no distension and no mass. There is no tenderness. There is no rebound and no guarding. Hernia confirmed negative in the right inguinal area and confirmed negative in the left inguinal area.  Genitourinary: Rectum normal, prostate normal, testes normal and penis normal. Rectal exam shows no external hemorrhoid, no internal hemorrhoid, no fissure, no mass, no tenderness and anal tone normal. Guaiac negative stool. Prostate is not enlarged and not tender. Right testis shows no mass, no swelling and no tenderness. Right testis is descended. Left testis shows no mass, no swelling and no tenderness. Left testis is descended. Circumcised. No penile erythema or penile tenderness. No discharge found.  Musculoskeletal: Normal range of motion. He exhibits  no edema and no tenderness.  Lymphadenopathy:  He has no cervical adenopathy.       Right: No inguinal adenopathy present.       Left: No inguinal adenopathy present.  Neurological: He is oriented to person, place, and time.  Skin: Skin is warm and dry. Petechiae and rash noted. No abrasion, no bruising, no ecchymosis, no laceration, no lesion and no purpura noted. Rash is papular. Rash is not macular, not maculopapular, not nodular, not pustular, not vesicular and not urticarial. He is not diaphoretic. No cyanosis or erythema. No pallor. Nails show clubbing.     Psychiatric: His speech is normal and behavior is normal. Judgment and thought content normal. His mood appears anxious. His affect is not angry, not blunt, not labile and not inappropriate. Cognition and memory are normal. He does not exhibit a depressed mood.      Lab Results  Component Value Date   WBC 7.7 01/06/2009   HGB 14.0 01/06/2009   HCT 41.2 01/06/2009   PLT 256.0 01/06/2009   GLUCOSE 81 01/06/2009   ALT 48 01/06/2009   AST 52* 01/06/2009   NA 142 01/06/2009   K 4.3 01/06/2009   CL 103 01/06/2009   CREATININE 0.9 01/06/2009   BUN 5* 01/06/2009   CO2 32 01/06/2009   TSH 1.24 01/06/2009   HGBA1C 4.7 01/06/2009      Assessment & Plan:

## 2012-01-14 DIAGNOSIS — N342 Other urethritis: Secondary | ICD-10-CM | POA: Insufficient documentation

## 2012-01-14 LAB — DRUGS OF ABUSE SCREEN W/O ALC, ROUTINE URINE
Amphetamine Screen, Ur: NEGATIVE
Barbiturate Quant, Ur: NEGATIVE
Benzodiazepines.: POSITIVE — AB
Cocaine Metabolites: NEGATIVE
Creatinine,U: 766.1 mg/dL
Marijuana Metabolite: POSITIVE — AB
Methadone: POSITIVE — AB
Opiate Screen, Urine: NEGATIVE
Phencyclidine (PCP): NEGATIVE
Propoxyphene: NEGATIVE

## 2012-01-14 LAB — HIV ANTIBODY (ROUTINE TESTING W REFLEX): HIV: NONREACTIVE

## 2012-01-14 LAB — OXYCODONE SCREEN, UA, RFLX CONFIRM: Oxycodone Screen, Ur: NEGATIVE ng/mL

## 2012-01-14 MED ORDER — DOXYCYCLINE HYCLATE 100 MG PO TBEC: 100.0000 mg | DELAYED_RELEASE_TABLET | Freq: Two times a day (BID) | ORAL | Status: AC

## 2012-01-14 NOTE — Assessment & Plan Note (Signed)
His urine shows evidence of infection, will start doxycycline

## 2012-01-14 NOTE — Addendum Note (Signed)
Addended by: Etta Grandchild on: 01/14/2012 11:43 AM   Modules accepted: Orders

## 2012-01-15 ENCOUNTER — Encounter: Payer: Self-pay | Admitting: Internal Medicine

## 2012-01-15 LAB — BENZODIAZEPINES (GC/LC/MS), URINE
Alprazolam (GC/LC/MS), ur confirm: NEGATIVE ng/mL
Alprazolam metabolite (GC/LC/MS), ur confirm: NEGATIVE ng/mL
Clonazepam metabolite (GC/LC/MS), ur confirm: NEGATIVE ng/mL
Diazepam (GC/LC/MS), ur confirm: NEGATIVE ng/mL
Estazolam (GC/LC/MS), ur confirm: NEGATIVE ng/mL
Flunitrazepam metabolite (GC/LC/MS), ur confirm: NEGATIVE ng/mL
Flurazepam metabolite (GC/LC/MS), ur confirm: NEGATIVE ng/mL
Lorazepam (GC/LC/MS), ur confirm: NEGATIVE ng/mL
Midazolam (GC/LC/MS), ur confirm: NEGATIVE ng/mL
Nordiazepam (GC/LC/MS), ur confirm: 952 ng/mL
Oxazepam (GC/LC/MS), ur confirm: >10000 ng/mL
Temazepam (GC/LC/MS), ur confirm: >10000 ng/mL
Triazolam metabolite (GC/LC/MS), ur confirm: NEGATIVE ng/mL

## 2012-01-15 LAB — METHADONE (GC/LC/MS), URINE
EDDP (GC/LC/MS), ur confirm: >10000 ng/mL — ABNORMAL HIGH
Methadone (GC/LC/MS), ur confirm: >10000 ng/mL — ABNORMAL HIGH

## 2012-01-15 LAB — CANNABANOIDS (GC/LC/MS), URINE: THC-COOH (GC/LC/MS), ur confirm: 2428 ng/mL

## 2012-02-05 ENCOUNTER — Ambulatory Visit (INDEPENDENT_AMBULATORY_CARE_PROVIDER_SITE_OTHER)
Admission: RE | Admit: 2012-02-05 | Discharge: 2012-02-05 | Disposition: A | Discharge status: Home / Self Care | Payer: Self-pay | Source: Ambulatory Visit | Attending: Internal Medicine | Admitting: Internal Medicine

## 2012-02-05 ENCOUNTER — Other Ambulatory Visit (INDEPENDENT_AMBULATORY_CARE_PROVIDER_SITE_OTHER): Payer: Self-pay

## 2012-02-05 ENCOUNTER — Encounter: Payer: Self-pay | Admitting: Internal Medicine

## 2012-02-05 ENCOUNTER — Ambulatory Visit (INDEPENDENT_AMBULATORY_CARE_PROVIDER_SITE_OTHER): Payer: Self-pay | Admitting: Internal Medicine

## 2012-02-05 VITALS — BP 118/78 | HR 127 | Temp 98.6°F | Resp 16 | Wt 161.0 lb

## 2012-02-05 DIAGNOSIS — R160 Hepatomegaly, not elsewhere classified: Secondary | ICD-10-CM

## 2012-02-05 DIAGNOSIS — R1110 Vomiting, unspecified: Secondary | ICD-10-CM

## 2012-02-05 DIAGNOSIS — R059 Cough, unspecified: Secondary | ICD-10-CM

## 2012-02-05 DIAGNOSIS — E876 Hypokalemia: Secondary | ICD-10-CM

## 2012-02-05 DIAGNOSIS — R05 Cough: Secondary | ICD-10-CM

## 2012-02-05 DIAGNOSIS — K7030 Alcoholic cirrhosis of liver without ascites: Secondary | ICD-10-CM | POA: Insufficient documentation

## 2012-02-05 DIAGNOSIS — N342 Other urethritis: Secondary | ICD-10-CM

## 2012-02-05 DIAGNOSIS — R7402 Elevation of levels of lactic acid dehydrogenase (LDH): Secondary | ICD-10-CM

## 2012-02-05 DIAGNOSIS — R7401 Elevation of levels of liver transaminase levels: Secondary | ICD-10-CM

## 2012-02-05 LAB — URINALYSIS, ROUTINE W REFLEX MICROSCOPIC
Hgb urine dipstick: NEGATIVE
Nitrite: POSITIVE
Specific Gravity, Urine: >=1.03 (ref 1.000–1.030)
Total Protein, Urine: 100
Urine Glucose: 100
Urobilinogen, UA: >=8 (ref 0.0–1.0)
pH: 5.5 (ref 5.0–8.0)

## 2012-02-05 LAB — COMPREHENSIVE METABOLIC PANEL
ALT: 68 U/L — ABNORMAL HIGH (ref 0–53)
AST: 175 U/L — ABNORMAL HIGH (ref 0–37)
Albumin: 3.2 g/dL — ABNORMAL LOW (ref 3.5–5.2)
Alkaline Phosphatase: 96 U/L (ref 39–117)
BUN: 8 mg/dL (ref 6–23)
CO2: 25 mEq/L (ref 19–32)
Calcium: 9.3 mg/dL (ref 8.4–10.5)
Chloride: 96 mEq/L (ref 96–112)
Creatinine, Ser: 1.1 mg/dL (ref 0.4–1.5)
GFR: 81.75 mL/min (ref >60.00)
Glucose, Bld: 110 mg/dL — ABNORMAL HIGH (ref 70–99)
Potassium: 2.9 mEq/L — ABNORMAL LOW (ref 3.5–5.1)
Sodium: 133 mEq/L — ABNORMAL LOW (ref 135–145)
Total Bilirubin: 4.5 mg/dL — ABNORMAL HIGH (ref 0.3–1.2)
Total Protein: 9.7 g/dL — ABNORMAL HIGH (ref 6.0–8.3)

## 2012-02-05 LAB — PROTIME-INR
INR: 1.6 ratio — ABNORMAL HIGH (ref 0.8–1.0)
Prothrombin Time: 17 s — ABNORMAL HIGH (ref 10.2–12.4)

## 2012-02-05 LAB — LIPASE: Lipase: 63 U/L — ABNORMAL HIGH (ref 11.0–59.0)

## 2012-02-05 LAB — AMYLASE: Amylase: 51 U/L (ref 27–131)

## 2012-02-05 MED ORDER — PROMETHAZINE HCL 12.5 MG PO TABS: 12.5000 mg | ORAL_TABLET | Freq: Four times a day (QID) | ORAL | Status: DC | PRN

## 2012-02-05 NOTE — Patient Instructions (Signed)
Hepatitis Virus Studies Hepatitis is an inflammation of the liver which can be caused by viruses, alcohol intake, various drugs, toxins, or bad infections caused by bacteria. The three most common viruses now recognized to cause disease are hepatitis A, hepatitis B, and hepatitis C (also called non A / non B). Hepatitis D and E are other forms of viruses. Most often the hepatitis viruses are associated with the enzymes found in the liver. These enzymes include the AST, ALT, and LDH. These are tests used to measure the likelihood of you having one of these viral infections.  The tests that are often done for liver function are: Serologic findings: HAV-AB/IgM  Hepatitis Testing Appearance/disappearance: 4-6 weeks/3-4 months  Application: Acute HAV infection Serologic findings: HAV-Ab/IgG  Hepatitis Testing Appearance/disappearance: 8-12 weeks/10years  Application: Previous HAV exposure Serologic findings: HBeAg  Hepatitis Testing Appearance/disappearance: 1-3 weeks/6-8 weeks  Application: Acute HBV infection Serologic findings: HBeAb  Hepatitis Testing Appearance/disappearance: 4-6 weeks/4-6 years  Application: Acute HBV infection ended Serologic findings: HBsAg  Hepatitis Testing Appearance/disappearance: 4-12 weeks/1-3 months  Application: Acute HBV infection Serologic findings: HBsAb total  Hepatitis Testing Appearance/disappearance: 3-10 months/6-10 years  Application: Previous HBV infection Serologic findings: HBVc-Ab/IgM  Hepatitis Testing Appearance/disappearance: 2-12 weeks/3-6 months  Application: Acute HBV infection Serologic findings: HBVc-Ab total  Hepatitis Testing Appearance/disappearance: 3-12 weeks/life  Application: Previous HBV infection Serologic findings: HCV-Ab/IgG  Hepatitis Testing Appearance/disappearance: 3-4 months/2years  Application: Previous HCV infection Serologic findings: HDV Ag  Hepatitis Testing Appearance/disappearance: 1-3 days/3-5  days  Application: Acute HDV infection Serologic findings: HDV-Ab total  Hepatitis Testing Appearance/disappearance: 2-3 months/7-14 months  Application: Chronic HDV infection PREPARATION FOR TEST No preparation or fasting is necessary. A blood sample is obtained by inserting a needle into a vein in the arm. NORMAL FINDINGS Negative Ranges for normal findings may vary among different laboratories and hospitals. You should always check with your doctor after having lab work or other tests done to discuss the meaning of your test results and whether your values are considered within normal limits. MEANING OF TEST  Your caregiver will go over the test results with you and discuss the importance and meaning of your results, as well as treatment options and the need for additional tests if necessary. OBTAINING THE TEST RESULTS It is your responsibility to obtain your test results. Ask the lab or department performing the test when and how you will get your results. Document Released: 03/09/2004 Document Revised: 04/29/2011 Document Reviewed: 01/16/2008 [facility] Patient Information 2013 [place], [place].

## 2012-02-05 NOTE — Progress Notes (Signed)
Subjective:    Patient ID: Jacob Hill, male    DOB: 1970/08/10, 41 y.o.   MRN: [id number]  HPI  He returns for f/up and complains of persistent nausea and vomiting. He has stopped taking tylenol and has not had any alcohol for several weeks.  Review of Systems  Constitutional: Positive for activity change, appetite change and unexpected weight change (weight loss). Negative for fever, chills, diaphoresis and fatigue.  HENT: Negative.   Eyes: Negative.   Respiratory: Positive for cough. Negative for chest tightness, shortness of breath, wheezing and stridor.   Cardiovascular: Negative for chest pain, palpitations and leg swelling.  Gastrointestinal: Positive for nausea and vomiting. Negative for abdominal pain, diarrhea, constipation, blood in stool, abdominal distention, anal bleeding and rectal pain.  Genitourinary: Negative for dysuria, urgency, frequency, hematuria, flank pain, decreased urine volume, enuresis, difficulty urinating and testicular pain.  Musculoskeletal: Positive for arthralgias (feet). Negative for myalgias, back pain, joint swelling and gait problem.  Skin: Positive for rash. Negative for color change, pallor and wound.  Neurological: Positive for weakness (all over). Negative for dizziness, tremors, seizures, light-headedness and numbness.  Hematological: Negative for adenopathy. Does not bruise/bleed easily.  Psychiatric/Behavioral: Positive for sleep disturbance. Negative for suicidal ideas, hallucinations, behavioral problems, confusion, dysphoric mood, decreased concentration and agitation. The patient is nervous/anxious. The patient is not hyperactive.        Objective:   Physical Exam  Vitals reviewed. Constitutional: He is oriented to person, place, and time. He appears well-developed and well-nourished.  Non-toxic appearance. He has a sickly appearance. He does not appear ill. No distress.  HENT:  Head: Normocephalic and atraumatic.  Mouth/Throat:  Oropharynx is clear and moist. No oropharyngeal exudate.  Eyes: Conjunctivae normal are normal. Right eye exhibits no discharge. Left eye exhibits no discharge. No scleral icterus.  Neck: Normal range of motion. Neck supple. No JVD present. No tracheal deviation present. No thyromegaly present.  Cardiovascular: Normal rate, regular rhythm, normal heart sounds and intact distal pulses.  Exam reveals no gallop and no friction rub.   No murmur heard. Pulmonary/Chest: Effort normal and breath sounds normal. No stridor. No respiratory distress. He has no wheezes. He has no rales. He exhibits no tenderness.  Abdominal: Soft. Normal appearance and bowel sounds are normal. He exhibits no shifting dullness, no distension, no pulsatile liver, no fluid wave, no abdominal bruit, no ascites, no pulsatile midline mass and no mass. There is hepatosplenomegaly and hepatomegaly. There is no splenomegaly. There is no tenderness. There is no rigidity, no rebound, no guarding, no tenderness at McBurney's point and negative Murphy's sign. No hernia. Hernia confirmed negative in the ventral area.    Musculoskeletal: Normal range of motion. He exhibits no edema and no tenderness.  Lymphadenopathy:    He has no cervical adenopathy.  Neurological: He is alert and oriented to person, place, and time.  Skin: Skin is warm and dry. Rash (spider angioma persist across his face and chest) noted. He is not diaphoretic. No erythema. No pallor.  Psychiatric: He has a normal mood and affect. His behavior is normal. Judgment and thought content normal.      Lab Results  Component Value Date   WBC 10.9* 01/13/2012   HGB 13.8 01/13/2012   HCT 41.4 01/13/2012   PLT 198.0 01/13/2012   GLUCOSE 119* 01/13/2012   CHOL 189 01/13/2012   TRIG 188.0* 01/13/2012   HDL 13.50* 01/13/2012   LDLCALC 138* 01/13/2012   ALT 59* 01/13/2012   AST 172*  01/13/2012   NA 134* 01/13/2012   K 3.5 01/13/2012   CL 96 01/13/2012   CREATININE 0.9  01/13/2012   BUN 6 01/13/2012   CO2 27 01/13/2012   TSH 5.21 01/13/2012   HGBA1C 4.7 01/06/2009      Assessment & Plan:

## 2012-02-06 ENCOUNTER — Encounter: Payer: Self-pay | Admitting: Internal Medicine

## 2012-02-06 DIAGNOSIS — R05 Cough: Secondary | ICD-10-CM | POA: Insufficient documentation

## 2012-02-06 DIAGNOSIS — E876 Hypokalemia: Secondary | ICD-10-CM | POA: Insufficient documentation

## 2012-02-06 DIAGNOSIS — R059 Cough, unspecified: Secondary | ICD-10-CM | POA: Insufficient documentation

## 2012-02-06 LAB — HEPATITIS PANEL, ACUTE
HCV Ab: NEGATIVE
Hep A IgM: NEGATIVE
Hep B C IgM: NEGATIVE
Hepatitis B Surface Ag: NEGATIVE

## 2012-02-06 LAB — HEPATITIS C ANTIBODY: HCV Ab: NEGATIVE

## 2012-02-06 LAB — ETHANOL: Alcohol, Ethyl (B): <10 mg/dL (ref 0–10)

## 2012-02-06 MED ORDER — POTASSIUM CHLORIDE CRYS ER 20 MEQ PO TBCR: 20.0000 meq | EXTENDED_RELEASE_TABLET | Freq: Two times a day (BID) | ORAL | Status: DC

## 2012-02-09 ENCOUNTER — Encounter: Payer: Self-pay | Admitting: Internal Medicine

## 2012-02-09 NOTE — Assessment & Plan Note (Signed)
He tells me that he completed the doxy I will recheck his urine today

## 2012-02-09 NOTE — Assessment & Plan Note (Addendum)
He will try phenergan for symptom relief I think this is due to the liver disease but will check today to see if he has pancreatitis

## 2012-02-09 NOTE — Assessment & Plan Note (Signed)
CT scan and labs today

## 2012-02-09 NOTE — Assessment & Plan Note (Signed)
I will recheck his K+ level today 

## 2012-02-09 NOTE — Assessment & Plan Note (Signed)
cxr today to look for a structural lesion

## 2012-02-09 NOTE — Assessment & Plan Note (Signed)
He has hepatitis and a large liver - I will check his LFT's today and will screen him for alcohol abuse, viral hepatitis I have asked him to get a CT done of his liver to see if there is a mass, cirrhosis, or obstructing lesion to explain his s/s

## 2012-02-10 ENCOUNTER — Ambulatory Visit (INDEPENDENT_AMBULATORY_CARE_PROVIDER_SITE_OTHER)
Admission: RE | Admit: 2012-02-10 | Discharge: 2012-02-10 | Disposition: A | Discharge status: Home / Self Care | Payer: Self-pay | Source: Ambulatory Visit | Attending: Internal Medicine | Admitting: Internal Medicine

## 2012-02-10 ENCOUNTER — Encounter: Payer: Self-pay | Admitting: Internal Medicine

## 2012-02-10 DIAGNOSIS — R1110 Vomiting, unspecified: Secondary | ICD-10-CM

## 2012-02-10 DIAGNOSIS — R160 Hepatomegaly, not elsewhere classified: Secondary | ICD-10-CM

## 2012-02-10 DIAGNOSIS — R7401 Elevation of levels of liver transaminase levels: Secondary | ICD-10-CM

## 2012-02-10 MED ORDER — IOHEXOL 300 MG/ML  SOLN
100.0000 mL | Freq: Once | INTRAMUSCULAR | Status: AC | PRN
  Administered 2012-02-10: 100 mL via INTRAVENOUS

## 2012-02-18 ENCOUNTER — Encounter: Payer: Self-pay | Admitting: Internal Medicine

## 2012-02-18 ENCOUNTER — Ambulatory Visit (INDEPENDENT_AMBULATORY_CARE_PROVIDER_SITE_OTHER): Payer: Self-pay | Admitting: Internal Medicine

## 2012-02-18 VITALS — BP 120/80 | HR 129 | Temp 98.4°F | Resp 20 | Wt 161.5 lb

## 2012-02-18 DIAGNOSIS — R7402 Elevation of levels of lactic acid dehydrogenase (LDH): Secondary | ICD-10-CM

## 2012-02-18 DIAGNOSIS — R7401 Elevation of levels of liver transaminase levels: Secondary | ICD-10-CM

## 2012-02-18 DIAGNOSIS — Z23 Encounter for immunization: Secondary | ICD-10-CM

## 2012-02-18 DIAGNOSIS — K7030 Alcoholic cirrhosis of liver without ascites: Secondary | ICD-10-CM

## 2012-02-18 DIAGNOSIS — E876 Hypokalemia: Secondary | ICD-10-CM

## 2012-02-18 NOTE — Patient Instructions (Signed)

## 2012-02-18 NOTE — Assessment & Plan Note (Signed)
I will recheck his K+ and Mg++ levels today 

## 2012-02-18 NOTE — Progress Notes (Signed)
Subjective:    Patient ID: Jacob Hill, male    DOB: 1970-06-25, 41 y.o.   MRN: [id number]  HPI  He returns for f/up and tells me that he feels much better with no more N/V and he has a good appetite. His CT showed a significant amount of cirrhosis with portal vein hypertension and splenomegaly. He has stopped drinking alcohol.  Review of Systems  Constitutional: Positive for fatigue. Negative for fever, chills, diaphoresis, activity change, appetite change and unexpected weight change.  HENT: Negative.   Eyes: Negative.   Respiratory: Negative.   Cardiovascular: Negative.   Gastrointestinal: Negative for nausea, vomiting, abdominal pain, diarrhea, constipation and abdominal distention.  Genitourinary: Negative.   Musculoskeletal: Negative.   Skin: Negative.   Neurological: Negative.   Hematological: Negative for adenopathy. Does not bruise/bleed easily.  Psychiatric/Behavioral: Negative.        Objective:   Physical Exam  Vitals reviewed. Constitutional: He is oriented to person, place, and time.  Non-toxic appearance. He has a sickly appearance (cachectic). He appears ill (spider veins across his face). No distress.  HENT:  Mouth/Throat: Oropharynx is clear and moist. No oropharyngeal exudate.  Eyes: Conjunctivae normal are normal. Right eye exhibits no discharge. Left eye exhibits no discharge. No scleral icterus.  Neck: Normal range of motion. Neck supple. No JVD present. No tracheal deviation present. No thyromegaly present.  Cardiovascular: Normal rate, regular rhythm, normal heart sounds and intact distal pulses.  Exam reveals no gallop and no friction rub.   No murmur heard. Pulmonary/Chest: Effort normal and breath sounds normal. No stridor. No respiratory distress. He has no wheezes. He has no rales. He exhibits no tenderness.  Abdominal: Soft. Normal appearance and bowel sounds are normal. He exhibits no shifting dullness, no distension, no pulsatile liver, no  fluid wave, no abdominal bruit, no ascites, no pulsatile midline mass and no mass. There is hepatosplenomegaly, splenomegaly and hepatomegaly. There is no tenderness. There is no rigidity, no rebound, no guarding, no tenderness at McBurney's point and negative Murphy's sign. No hernia. Hernia confirmed negative in the ventral area.  Musculoskeletal: Normal range of motion. He exhibits no edema and no tenderness.  Lymphadenopathy:    He has no cervical adenopathy.  Neurological: He is oriented to person, place, and time.  Skin: Skin is warm and dry. No rash noted. He is not diaphoretic. No erythema. No pallor.  Psychiatric: He has a normal mood and affect. His behavior is normal. Judgment and thought content normal.     Lab Results  Component Value Date   WBC 10.9* 01/13/2012   HGB 13.8 01/13/2012   HCT 41.4 01/13/2012   PLT 198.0 01/13/2012   GLUCOSE 110* 02/05/2012   CHOL 189 01/13/2012   TRIG 188.0* 01/13/2012   HDL 13.50* 01/13/2012   LDLCALC 138* 01/13/2012   ALT 68* 02/05/2012   AST 175* 02/05/2012   NA 133* 02/05/2012   K 2.9* 02/05/2012   CL 96 02/05/2012   CREATININE 1.1 02/05/2012   BUN 8 02/05/2012   CO2 25 02/05/2012   TSH 5.21 01/13/2012   INR 1.6* 02/05/2012   HGBA1C 4.7 01/06/2009   Ct Abdomen Pelvis W Contrast  02/10/2012  *RADIOLOGY REPORT*  Clinical Data: Abnormal liver function studies.  CT ABDOMEN AND PELVIS WITH CONTRAST  Technique:  Multidetector CT imaging of the abdomen and pelvis was performed following the standard protocol during bolus administration of intravenous contrast.  Contrast: OMNIPAQUE IOHEXOL 300 MG/ML  SOLN  Comparison: None.  Findings: The lung bases are clear.  No pulmonary nodules or pleural effusion.  Heart is normal in size.  No pericardial effusion.  The distal esophagus is unremarkable.  There are cirrhotic changes involving the liver with an irregular contour or and increased caudate to right lobe ratio.  Marked heterogeneous  attenuation of the liver likely due to a combination of regenerating nodules and confluent hepatic fibrosis.  There may be some component of geographic fatty infiltration also.  I do not see any definite worrisome hepatic mass.  Small low attenuation lesion in the right hepatic lobe on image number 31 is likely benign.  The hepatic and portal veins are patent.  There are however changes of portal venous hypertension with portal venous collaterals.  The spleen is upper limits of normal in size measuring 13.5 x 11.5 x 8.3 cm.  No focal splenic lesions.  The pancreas is normal.  The gallbladder is normal and the common bile duct is mildly dilated measuring a maximum of 9.5 mm in the porta hepatis and 8.5 mm in head of pancreas.  No obvious cause for this is demonstrated.  The adrenal glands and kidneys are unremarkable.  The stomach, duodenum, small bowel and colon are unremarkable.  No inflammatory changes or mass lesions.  The appendix is not identified for certain.  There is and inflammatory process or post inflammatory process in the right abdomen beginning of near the hepatic flexure and extending along the anterior pararenal space and into the upper right pelvis.  Could be related to a previous ureteral obstruction.  I do not see any findings to suggest acute appendicitis.  The aorta is normal in caliber.  Atherosclerotic changes are noted. There are scattered mesenteric and retroperitoneal lymph nodes but no mass or adenopathy.  The bladder, prostate gland seminal vesicles are unremarkable.  No pelvic mass or adenopathy.  No free pelvic fluid collections.  No inguinal mass or adenopathy.  The bony structures are unremarkable.  IMPRESSION:  1.  Cirrhotic changes involving the liver with probable regenerating nodules and confluent hepatic fibrosis.  There may be some combination of fatty infiltration.  No obvious worrisome hepatic lesion but MRI / MRCP without and with contrast would be helpful for further  evaluation. 2.  Borderline splenomegaly with portal hypertension and portal venous collaterals.  No ascites. 3.  Inflammatory or post inflammatory changes in the right abdomen as discussed above. 4.  Mild central intrahepatic and common bile duct dilatation without obvious cause.   Original Report Authenticated By: Rudie Meyer, M.D.      Assessment & Plan:

## 2012-02-18 NOTE — Assessment & Plan Note (Addendum)
GI referral - ? Need for EGD to see if he has eso varices that need to be treated twinrix dose #1 today He will stop all tylenol and EtOH

## 2012-02-18 NOTE — Assessment & Plan Note (Signed)
This is hepatitis due to a long heavy history of EtOH intake

## 2012-02-21 ENCOUNTER — Other Ambulatory Visit: Payer: Self-pay | Admitting: Internal Medicine

## 2012-03-18 ENCOUNTER — Other Ambulatory Visit: Payer: Self-pay | Admitting: Internal Medicine

## 2012-03-19 ENCOUNTER — Ambulatory Visit: Payer: Self-pay | Admitting: Internal Medicine

## 2012-03-31 ENCOUNTER — Encounter: Payer: Self-pay | Admitting: Internal Medicine

## 2012-03-31 ENCOUNTER — Other Ambulatory Visit (INDEPENDENT_AMBULATORY_CARE_PROVIDER_SITE_OTHER): Payer: Self-pay

## 2012-03-31 ENCOUNTER — Ambulatory Visit (INDEPENDENT_AMBULATORY_CARE_PROVIDER_SITE_OTHER): Payer: Self-pay | Admitting: Internal Medicine

## 2012-03-31 VITALS — BP 130/80 | HR 80 | Temp 98.8°F | Resp 16 | Wt 160.5 lb

## 2012-03-31 DIAGNOSIS — K5900 Constipation, unspecified: Secondary | ICD-10-CM

## 2012-03-31 DIAGNOSIS — E876 Hypokalemia: Secondary | ICD-10-CM

## 2012-03-31 DIAGNOSIS — R569 Unspecified convulsions: Secondary | ICD-10-CM

## 2012-03-31 DIAGNOSIS — R7309 Other abnormal glucose: Secondary | ICD-10-CM

## 2012-03-31 LAB — TSH: TSH: 2.56 u[IU]/mL (ref 0.35–5.50)

## 2012-03-31 LAB — BASIC METABOLIC PANEL
BUN: 8 mg/dL (ref 6–23)
CO2: 27 mEq/L (ref 19–32)
Calcium: 9.3 mg/dL (ref 8.4–10.5)
Chloride: 102 mEq/L (ref 96–112)
Creatinine, Ser: 0.9 mg/dL (ref 0.4–1.5)
GFR: 101.25 mL/min (ref >60.00)
Glucose, Bld: 83 mg/dL (ref 70–99)
Potassium: 3.8 mEq/L (ref 3.5–5.1)
Sodium: 135 mEq/L (ref 135–145)

## 2012-03-31 LAB — HEMOGLOBIN A1C: Hgb A1c MFr Bld: 4.5 % — ABNORMAL LOW (ref 4.6–6.5)

## 2012-03-31 LAB — MAGNESIUM: Magnesium: 1.6 mg/dL (ref 1.5–2.5)

## 2012-03-31 MED ORDER — LINACLOTIDE 145 MCG PO CAPS: 145.0000 ug | ORAL_CAPSULE | Freq: Every day | ORAL | Status: DC

## 2012-03-31 NOTE — Progress Notes (Signed)
Subjective:    Patient ID: Jacob Hill, male    DOB: 12/29/1970, 42 y.o.   MRN: [id number]  Constipation This is a new problem. The current episode started more than 1 month ago. The problem has been gradually worsening since onset. His stool frequency is 1 time per day. The stool is described as firm and formed. The patient is not on a high fiber diet. He does not exercise regularly. There has not been adequate water intake. Pertinent negatives include no abdominal pain, anorexia, back pain, bloating, diarrhea, difficulty urinating, fecal incontinence, fever, flatus, hematochezia, hemorrhoids, melena, nausea, rectal pain, vomiting or weight loss. Risk factors include change in medication usage/dosage. He has tried enemas, laxatives and stool softeners for the symptoms. The treatment provided mild relief.      Review of Systems  Constitutional: Negative for fever, chills, weight loss, diaphoresis, activity change, appetite change, fatigue and unexpected weight change.  HENT: Negative.  Negative for nosebleeds.   Eyes: Negative.   Respiratory: Negative.  Negative for cough, chest tightness, shortness of breath, wheezing and stridor.   Cardiovascular: Negative for chest pain, palpitations and leg swelling.  Gastrointestinal: Positive for constipation. Negative for nausea, vomiting, abdominal pain, diarrhea, melena, hematochezia, abdominal distention, anal bleeding, rectal pain, bloating, anorexia, flatus and hemorrhoids.  Endocrine: Negative.   Genitourinary: Negative.  Negative for difficulty urinating.  Musculoskeletal: Positive for arthralgias (feet). Negative for myalgias, back pain, joint swelling and gait problem.  Skin: Negative.  Negative for color change, pallor, rash and wound.  Neurological: Negative.  Negative for dizziness, tremors, weakness, light-headedness and numbness.  Hematological: Negative for adenopathy. Does not bruise/bleed easily.  Psychiatric/Behavioral:  Negative.        Objective:   Physical Exam  Vitals reviewed. Constitutional: He is oriented to person, place, and time. He appears well-developed and well-nourished. No distress.  HENT:  Head: Normocephalic and atraumatic.  Nose: Nose normal.  Mouth/Throat: Oropharynx is clear and moist. No oropharyngeal exudate.  Eyes: Right eye exhibits no chemosis, no discharge and no exudate. Left eye exhibits no chemosis, no discharge and no exudate. Right conjunctiva is not injected. Right conjunctiva has a hemorrhage. Left conjunctiva is not injected. Left conjunctiva has no hemorrhage. No scleral icterus.    Neck: Normal range of motion. Neck supple. No JVD present. No tracheal deviation present. No thyromegaly present.  Cardiovascular: Normal rate, regular rhythm, normal heart sounds and intact distal pulses.  Exam reveals no gallop.   No murmur heard. Pulmonary/Chest: Effort normal and breath sounds normal. No stridor. No respiratory distress. He has no wheezes. He has no rales. He exhibits no tenderness.  Abdominal: Soft. Bowel sounds are normal. He exhibits no distension and no mass. There is no tenderness. There is no rebound and no guarding.  Musculoskeletal: Normal range of motion. He exhibits no edema and no tenderness.  Lymphadenopathy:    He has no cervical adenopathy.  Neurological: He is oriented to person, place, and time.  Skin: Skin is warm and dry. No rash noted. He is not diaphoretic. No erythema. No pallor.  Psychiatric: He has a normal mood and affect. His behavior is normal. Judgment and thought content normal.      Lab Results  Component Value Date   WBC 10.9* 01/13/2012   HGB 13.8 01/13/2012   HCT 41.4 01/13/2012   PLT 198.0 01/13/2012   GLUCOSE 110* 02/05/2012   CHOL 189 01/13/2012   TRIG 188.0* 01/13/2012   HDL 13.50* 01/13/2012   LDLCALC 138* 01/13/2012  ALT 68* 02/05/2012   AST 175* 02/05/2012   NA 133* 02/05/2012   K 2.9* 02/05/2012   CL 96 02/05/2012     CREATININE 1.1 02/05/2012   BUN 8 02/05/2012   CO2 25 02/05/2012   TSH 5.21 01/13/2012   INR 1.6* 02/05/2012   HGBA1C 4.7 01/06/2009      Assessment & Plan:

## 2012-03-31 NOTE — Patient Instructions (Signed)

## 2012-04-02 ENCOUNTER — Encounter: Payer: Self-pay | Admitting: Internal Medicine

## 2012-04-02 NOTE — Assessment & Plan Note (Signed)
I will check his A1C to see if he has developed DM2 

## 2012-04-02 NOTE — Assessment & Plan Note (Signed)
I will recheck his K+ level today 

## 2012-04-02 NOTE — Assessment & Plan Note (Signed)
No seizures reported He will continue valium

## 2012-04-02 NOTE — Assessment & Plan Note (Signed)
This is most likely caused by methadone I gave him samples of linzess to try

## 2012-04-13 ENCOUNTER — Ambulatory Visit: Payer: Self-pay | Admitting: Internal Medicine

## 2012-04-17 ENCOUNTER — Other Ambulatory Visit: Payer: Self-pay | Admitting: Internal Medicine

## 2012-05-14 ENCOUNTER — Other Ambulatory Visit: Payer: Self-pay | Admitting: Internal Medicine

## 2012-06-13 ENCOUNTER — Other Ambulatory Visit: Payer: Self-pay | Admitting: Internal Medicine

## 2012-06-22 ENCOUNTER — Other Ambulatory Visit: Payer: Self-pay | Admitting: Internal Medicine

## 2012-06-22 DIAGNOSIS — K7030 Alcoholic cirrhosis of liver without ascites: Secondary | ICD-10-CM

## 2012-06-30 ENCOUNTER — Ambulatory Visit (INDEPENDENT_AMBULATORY_CARE_PROVIDER_SITE_OTHER): Payer: Self-pay | Admitting: Internal Medicine

## 2012-06-30 ENCOUNTER — Encounter: Payer: Self-pay | Admitting: Internal Medicine

## 2012-06-30 VITALS — BP 150/98 | HR 121 | Temp 99.1°F | Resp 20 | Wt 160.8 lb

## 2012-06-30 DIAGNOSIS — K7030 Alcoholic cirrhosis of liver without ascites: Secondary | ICD-10-CM

## 2012-06-30 DIAGNOSIS — I10 Essential (primary) hypertension: Secondary | ICD-10-CM

## 2012-06-30 MED ORDER — PROPRANOLOL HCL ER BEADS 80 MG PO CP24: 80.0000 mg | ORAL_CAPSULE | Freq: Every day | ORAL | Status: DC

## 2012-06-30 NOTE — Assessment & Plan Note (Signed)
Start beta-blocker therapy with innopran

## 2012-06-30 NOTE — Assessment & Plan Note (Addendum)
Start beta/blocker for the varices His liver disease appears stable today He has abstained from alcohol for 3 months I have asked him to be seen in the hepatology clinic

## 2012-06-30 NOTE — Progress Notes (Signed)
Subjective:    Patient ID: Jacob Hill, male    DOB: 12-Jun-1970, 42 y.o.   MRN: [id number]  Hypertension This is a chronic problem. The current episode started more than 1 year ago. The problem has been gradually worsening since onset. The problem is uncontrolled. Associated symptoms include anxiety. Pertinent negatives include no blurred vision, chest pain, headaches, malaise/fatigue, neck pain, orthopnea, palpitations, peripheral edema, PND, shortness of breath or sweats. Past treatments include nothing. Compliance problems include exercise and diet.       Review of Systems  Constitutional: Negative.  Negative for fever, chills, malaise/fatigue, diaphoresis, activity change, appetite change, fatigue and unexpected weight change.  HENT: Negative.  Negative for neck pain.   Eyes: Negative.  Negative for blurred vision.  Respiratory: Negative.  Negative for cough, chest tightness, shortness of breath, wheezing and stridor.   Cardiovascular: Negative for chest pain, palpitations, orthopnea, leg swelling and PND.  Gastrointestinal: Negative.  Negative for nausea, vomiting, abdominal pain, diarrhea, constipation, abdominal distention, anal bleeding and rectal pain.  Endocrine: Negative.   Genitourinary: Negative.   Musculoskeletal: Positive for arthralgias (feet). Negative for myalgias, back pain, joint swelling and gait problem.  Skin: Negative.   Allergic/Immunologic: Negative.   Neurological: Negative.  Negative for dizziness, weakness, light-headedness and headaches.  Hematological: Negative.  Negative for adenopathy. Does not bruise/bleed easily.  Psychiatric/Behavioral: Negative.        Objective:   Physical Exam  Vitals reviewed. Constitutional: He is oriented to person, place, and time. He appears well-developed and well-nourished. No distress.  HENT:  Head: Normocephalic and atraumatic.  Mouth/Throat: Oropharynx is clear and moist. No oropharyngeal exudate.  Eyes:  Conjunctivae are normal. Right eye exhibits no discharge. Left eye exhibits no discharge. No scleral icterus.  Neck: Normal range of motion. Neck supple. No JVD present. No tracheal deviation present. No thyromegaly present.  Cardiovascular: Normal rate, regular rhythm, normal heart sounds and intact distal pulses.  Exam reveals no gallop and no friction rub.   No murmur heard. Pulmonary/Chest: Effort normal and breath sounds normal. No stridor. No respiratory distress. He has no wheezes. He has no rales. He exhibits no tenderness.  Abdominal: Soft. Normal appearance and bowel sounds are normal. He exhibits no shifting dullness, no distension, no pulsatile liver, no fluid wave, no abdominal bruit, no ascites, no pulsatile midline mass and no mass. There is hepatomegaly. There is no hepatosplenomegaly or splenomegaly. There is no tenderness. There is no rebound, no guarding and no CVA tenderness. No hernia. Hernia confirmed negative in the ventral area, confirmed negative in the right inguinal area and confirmed negative in the left inguinal area.  Musculoskeletal: Normal range of motion. He exhibits no edema and no tenderness.  Lymphadenopathy:    He has no cervical adenopathy.  Neurological: He is oriented to person, place, and time.  Skin: Skin is warm and dry. No rash noted. He is not diaphoretic. No erythema. No pallor.  Psychiatric: He has a normal mood and affect. His behavior is normal. Judgment and thought content normal.      Lab Results  Component Value Date   WBC 10.9* 01/13/2012   HGB 13.8 01/13/2012   HCT 41.4 01/13/2012   PLT 198.0 01/13/2012   GLUCOSE 83 03/31/2012   CHOL 189 01/13/2012   TRIG 188.0* 01/13/2012   HDL 13.50* 01/13/2012   LDLCALC 138* 01/13/2012   ALT 68* 02/05/2012   AST 175* 02/05/2012   NA 135 03/31/2012   K 3.8 03/31/2012   CL  102 03/31/2012   CREATININE 0.9 03/31/2012   BUN 8 03/31/2012   CO2 27 03/31/2012   TSH 2.56 03/31/2012   INR 1.6* 02/05/2012    HGBA1C 4.5* 03/31/2012      Assessment & Plan:

## 2012-06-30 NOTE — Patient Instructions (Signed)

## 2012-07-29 ENCOUNTER — Ambulatory Visit: Payer: Self-pay | Admitting: Internal Medicine

## 2012-08-10 ENCOUNTER — Encounter: Payer: Self-pay | Admitting: Internal Medicine

## 2012-08-10 ENCOUNTER — Ambulatory Visit (INDEPENDENT_AMBULATORY_CARE_PROVIDER_SITE_OTHER): Payer: Self-pay | Admitting: Internal Medicine

## 2012-08-10 VITALS — BP 120/80 | HR 70 | Temp 98.5°F | Resp 16 | Wt 163.5 lb

## 2012-08-10 DIAGNOSIS — K7030 Alcoholic cirrhosis of liver without ascites: Secondary | ICD-10-CM

## 2012-08-10 DIAGNOSIS — I10 Essential (primary) hypertension: Secondary | ICD-10-CM

## 2012-08-10 NOTE — Assessment & Plan Note (Addendum)
I have referred him to the Hepatitis Clinic again Will continue the propanolol for now

## 2012-08-10 NOTE — Patient Instructions (Signed)

## 2012-08-10 NOTE — Progress Notes (Signed)
Subjective:    Patient ID: Jacob Hill, male    DOB: [birth year], 42 y.o.   MRN: [id number]  Hypertension This is a chronic problem. The problem has been gradually improving since onset. The problem is controlled. Associated symptoms include anxiety. Pertinent negatives include no blurred vision, chest pain, headaches, malaise/fatigue, neck pain, orthopnea, palpitations, peripheral edema, PND, shortness of breath or sweats. Past treatments include beta blockers. The current treatment provides significant improvement. Compliance problems include exercise and diet.       Review of Systems  Constitutional: Positive for fatigue. Negative for fever, chills, malaise/fatigue, diaphoresis, activity change, appetite change and unexpected weight change.  HENT: Negative.  Negative for neck pain.   Eyes: Negative.  Negative for blurred vision.  Respiratory: Negative.  Negative for cough, choking, chest tightness, shortness of breath, wheezing and stridor.   Cardiovascular: Negative.  Negative for chest pain, palpitations, orthopnea, leg swelling and PND.  Gastrointestinal: Negative.  Negative for nausea, vomiting, abdominal pain, diarrhea, constipation, abdominal distention, anal bleeding and rectal pain.  Endocrine: Negative.   Genitourinary: Negative.   Musculoskeletal: Positive for arthralgias (feet and knees). Negative for myalgias, back pain, joint swelling and gait problem.  Skin: Negative.   Allergic/Immunologic: Negative.   Neurological: Negative.  Negative for dizziness and headaches.  Hematological: Negative.  Negative for adenopathy. Does not bruise/bleed easily.  Psychiatric/Behavioral: Negative for suicidal ideas, hallucinations, behavioral problems, confusion, sleep disturbance, self-injury, dysphoric mood, decreased concentration and agitation. The patient is nervous/anxious. The patient is not hyperactive.        Objective:   Physical Exam  Vitals reviewed. Constitutional:  He is oriented to person, place, and time. He appears well-developed and well-nourished. No distress.  HENT:  Head: Normocephalic and atraumatic.  Mouth/Throat: Oropharynx is clear and moist. No oropharyngeal exudate.  Eyes: Conjunctivae are normal. Right eye exhibits no discharge. Left eye exhibits no discharge. No scleral icterus.  Neck: Normal range of motion. Neck supple. No JVD present. No tracheal deviation present. No thyromegaly present.  Cardiovascular: Normal rate, regular rhythm, normal heart sounds and intact distal pulses.  Exam reveals no gallop and no friction rub.   No murmur heard. Pulmonary/Chest: Effort normal and breath sounds normal. No stridor. No respiratory distress. He has no wheezes. He has no rales. He exhibits no tenderness.  Abdominal: Soft. Normal appearance. He exhibits no shifting dullness, no distension, no fluid wave, no ascites and no mass. There is hepatosplenomegaly, splenomegaly and hepatomegaly. There is no tenderness. No hernia. Hernia confirmed negative in the ventral area.  Musculoskeletal: Normal range of motion. He exhibits no edema and no tenderness.  Lymphadenopathy:    He has no cervical adenopathy.  Neurological: He is oriented to person, place, and time.  Skin: Skin is warm and dry. Ecchymosis noted. No abrasion, no bruising, no burn, no laceration, no lesion, no petechiae, no purpura and no rash noted. Rash is not macular, not papular, not maculopapular, not nodular, not pustular, not vesicular and not urticarial. He is not diaphoretic. There is erythema. No cyanosis. No pallor. Nails show clubbing.  He has spider angiomata over his face and chest  Psychiatric: He has a normal mood and affect. His behavior is normal. Judgment and thought content normal.      Lab Results  Component Value Date   WBC 10.9* 01/13/2012   HGB 13.8 01/13/2012   HCT 41.4 01/13/2012   PLT 198.0 01/13/2012   GLUCOSE 83 03/31/2012   CHOL 189 01/13/2012   TRIG 188.0*  01/13/2012   HDL 13.50* 01/13/2012   LDLCALC 138* 01/13/2012   ALT 68* 02/05/2012   AST 175* 02/05/2012   NA 135 03/31/2012   K 3.8 03/31/2012   CL 102 03/31/2012   CREATININE 0.9 03/31/2012   BUN 8 03/31/2012   CO2 27 03/31/2012   TSH 2.56 03/31/2012   INR 1.6* 02/05/2012   HGBA1C 4.5* 03/31/2012      Assessment & Plan:

## 2012-08-10 NOTE — Assessment & Plan Note (Signed)
His BP is well controlled 

## 2012-09-25 ENCOUNTER — Other Ambulatory Visit: Payer: Self-pay | Admitting: Internal Medicine

## 2012-10-13 ENCOUNTER — Other Ambulatory Visit: Payer: Self-pay | Admitting: Internal Medicine

## 2012-11-13 ENCOUNTER — Other Ambulatory Visit: Payer: Self-pay | Admitting: Internal Medicine

## 2012-12-10 ENCOUNTER — Ambulatory Visit: Payer: Self-pay | Admitting: Internal Medicine

## 2012-12-16 ENCOUNTER — Encounter: Payer: Self-pay | Admitting: Internal Medicine

## 2012-12-16 ENCOUNTER — Ambulatory Visit (INDEPENDENT_AMBULATORY_CARE_PROVIDER_SITE_OTHER): Payer: Self-pay | Admitting: Internal Medicine

## 2012-12-16 VITALS — BP 134/70 | HR 92 | Temp 99.0°F | Resp 16 | Ht 72.0 in | Wt 158.0 lb

## 2012-12-16 DIAGNOSIS — I10 Essential (primary) hypertension: Secondary | ICD-10-CM

## 2012-12-16 DIAGNOSIS — K7030 Alcoholic cirrhosis of liver without ascites: Secondary | ICD-10-CM

## 2012-12-16 NOTE — Assessment & Plan Note (Signed)
His BP is well controlled on innopran

## 2012-12-16 NOTE — Progress Notes (Signed)
Subjective:    Patient ID: Jacob Hill, male    DOB: 10-Feb-1971, 42 y.o.   MRN: [id number]  Hypertension This is a chronic problem. The current episode started more than 1 year ago. The problem is unchanged. The problem is controlled. Associated symptoms include anxiety. Pertinent negatives include no blurred vision, chest pain, headaches, malaise/fatigue, neck pain, orthopnea, palpitations, peripheral edema, PND, shortness of breath or sweats. Past treatments include beta blockers. The current treatment provides moderate improvement. There are no compliance problems.       Review of Systems  Constitutional: Positive for fatigue. Negative for fever, chills, malaise/fatigue, diaphoresis and appetite change.  HENT: Negative.   Eyes: Negative.  Negative for blurred vision.  Respiratory: Negative for cough, choking, chest tightness, shortness of breath, wheezing and stridor.   Cardiovascular: Negative.  Negative for chest pain, palpitations, orthopnea, leg swelling and PND.  Gastrointestinal: Negative.  Negative for nausea, vomiting, abdominal pain, diarrhea and constipation.  Endocrine: Negative.   Genitourinary: Negative.   Musculoskeletal: Positive for arthralgias (both feet). Negative for back pain, gait problem, joint swelling, myalgias, neck pain and neck stiffness.  Skin: Negative.   Allergic/Immunologic: Negative.   Neurological: Negative.  Negative for dizziness and headaches.  Hematological: Negative.  Negative for adenopathy. Does not bruise/bleed easily.  Psychiatric/Behavioral: Negative.  Negative for suicidal ideas, hallucinations, behavioral problems, confusion, sleep disturbance, self-injury, dysphoric mood, decreased concentration and agitation. The patient is not nervous/anxious and is not hyperactive.        Objective:   Physical Exam  Vitals reviewed. Constitutional: He is oriented to person, place, and time. He appears well-developed and well-nourished. He  appears cachectic.  Non-toxic appearance. He has a sickly appearance. No distress.  HENT:  Head: Normocephalic and atraumatic.  Mouth/Throat: Oropharynx is clear and moist. No oropharyngeal exudate.  Eyes: Conjunctivae are normal. Right eye exhibits no discharge. Left eye exhibits no discharge. No scleral icterus.  Neck: Normal range of motion. Neck supple. No JVD present. No tracheal deviation present. No thyromegaly present.  Cardiovascular: Normal rate, regular rhythm, normal heart sounds and intact distal pulses.  Exam reveals no gallop and no friction rub.   No murmur heard. Pulmonary/Chest: Breath sounds normal. No stridor. No respiratory distress. He has no wheezes. He has no rales. He exhibits no tenderness.  Abdominal: Soft. Bowel sounds are normal. He exhibits no distension and no mass. There is no tenderness. There is no rebound and no guarding.  Musculoskeletal: Normal range of motion. He exhibits no edema and no tenderness.  Lymphadenopathy:    He has no cervical adenopathy.  Neurological: He is oriented to person, place, and time.  Skin: Skin is warm and dry. No rash noted. He is not diaphoretic. No erythema. No pallor.  Psychiatric: He has a normal mood and affect. His behavior is normal. Judgment and thought content normal.     Lab Results  Component Value Date   WBC 10.9* 01/13/2012   HGB 13.8 01/13/2012   HCT 41.4 01/13/2012   PLT 198.0 01/13/2012   GLUCOSE 83 03/31/2012   CHOL 189 01/13/2012   TRIG 188.0* 01/13/2012   HDL 13.50* 01/13/2012   LDLCALC 138* 01/13/2012   ALT 68* 02/05/2012   AST 175* 02/05/2012   NA 135 03/31/2012   K 3.8 03/31/2012   CL 102 03/31/2012   CREATININE 0.9 03/31/2012   BUN 8 03/31/2012   CO2 27 03/31/2012   TSH 2.56 03/31/2012   INR 1.6* 02/05/2012   HGBA1C 4.5* 03/31/2012  Assessment & Plan:

## 2012-12-16 NOTE — Patient Instructions (Signed)

## 2012-12-16 NOTE — Assessment & Plan Note (Signed)
No complications noted today regarding this Despite my referral, he has not seen GI yet

## 2013-03-16 ENCOUNTER — Other Ambulatory Visit: Payer: Self-pay | Admitting: Internal Medicine

## 2013-03-17 ENCOUNTER — Telehealth: Payer: Self-pay

## 2013-03-17 NOTE — Telephone Encounter (Signed)
Refills denied stating pt need to be see. Must be seen every three months for controls.

## 2013-03-17 NOTE — Telephone Encounter (Signed)
The patient called and is hoping to get medication (he did not specifically which med) refilled until his apt.

## 2013-03-19 ENCOUNTER — Encounter: Payer: Self-pay | Admitting: Internal Medicine

## 2013-03-19 ENCOUNTER — Ambulatory Visit (INDEPENDENT_AMBULATORY_CARE_PROVIDER_SITE_OTHER): Payer: Self-pay | Admitting: Internal Medicine

## 2013-03-19 VITALS — BP 130/80 | HR 133 | Temp 98.1°F | Resp 20 | Ht 72.0 in

## 2013-03-19 DIAGNOSIS — R1110 Vomiting, unspecified: Secondary | ICD-10-CM

## 2013-03-19 DIAGNOSIS — I10 Essential (primary) hypertension: Secondary | ICD-10-CM

## 2013-03-19 DIAGNOSIS — K7030 Alcoholic cirrhosis of liver without ascites: Secondary | ICD-10-CM

## 2013-03-19 DIAGNOSIS — R569 Unspecified convulsions: Secondary | ICD-10-CM

## 2013-03-19 MED ORDER — DIAZEPAM 10 MG PO TABS: 5.0000 mg | ORAL_TABLET | Freq: Three times a day (TID) | ORAL | Status: DC | PRN

## 2013-03-19 MED ORDER — PROPRANOLOL HCL ER BEADS 80 MG PO CP24: 80.0000 mg | ORAL_CAPSULE | Freq: Every day | ORAL | Status: DC

## 2013-03-19 MED ORDER — PROMETHAZINE HCL 12.5 MG PO TABS: 12.5000 mg | ORAL_TABLET | Freq: Four times a day (QID) | ORAL | Status: DC | PRN

## 2013-03-19 NOTE — Progress Notes (Signed)
Pre visit review using our clinic review tool, if applicable. No additional management support is needed unless otherwise documented below in the visit note. 

## 2013-03-19 NOTE — Patient Instructions (Signed)

## 2013-03-19 NOTE — Progress Notes (Signed)
Subjective:    Patient ID: Jacob Hill, male    DOB: 06/20/1970, 43 y.o.   MRN: [id number]  Hypertension This is a recurrent problem. The current episode started more than 1 year ago. The problem is unchanged. The problem is controlled. Associated symptoms include anxiety. Pertinent negatives include no blurred vision, chest pain, headaches, malaise/fatigue, neck pain, orthopnea, palpitations, peripheral edema, PND, shortness of breath or sweats. Past treatments include beta blockers. The current treatment provides moderate improvement. Compliance problems include exercise and diet.       Review of Systems  Constitutional: Positive for fatigue. Negative for fever, chills, malaise/fatigue, diaphoresis and appetite change.  HENT: Negative.   Eyes: Negative.  Negative for blurred vision.  Respiratory: Negative.  Negative for cough, choking, chest tightness and shortness of breath.   Cardiovascular: Negative.  Negative for chest pain, palpitations, orthopnea and PND.  Gastrointestinal: Negative.  Negative for nausea, vomiting, abdominal pain, diarrhea, constipation, blood in stool and anal bleeding.  Endocrine: Negative.   Genitourinary: Negative.   Musculoskeletal: Positive for arthralgias. Negative for back pain, gait problem, joint swelling, myalgias, neck pain and neck stiffness.  Skin: Negative.   Allergic/Immunologic: Negative.   Neurological: Negative.  Negative for headaches.  Hematological: Negative.  Negative for adenopathy. Does not bruise/bleed easily.  Psychiatric/Behavioral: Negative.        Objective:   Physical Exam  Constitutional: He is oriented to person, place, and time. He appears well-developed and well-nourished. He appears cachectic.  Non-toxic appearance. He has a sickly appearance. He does not appear ill. No distress.  HENT:  Head: Normocephalic and atraumatic.  Mouth/Throat: Oropharynx is clear and moist.  Eyes: Conjunctivae are normal. Right eye  exhibits no discharge. Left eye exhibits no discharge. Scleral icterus is present.  Neck: Normal range of motion. Neck supple. No JVD present. No tracheal deviation present. No thyromegaly present.  Cardiovascular: Normal rate, regular rhythm, normal heart sounds and intact distal pulses.  Exam reveals no gallop and no friction rub.   No murmur heard. Pulmonary/Chest: Effort normal and breath sounds normal. No stridor. No respiratory distress. He has no wheezes. He has no rales. He exhibits no tenderness.  Abdominal: Soft. Normal appearance and bowel sounds are normal. He exhibits no distension and no mass. There is hepatomegaly. There is no splenomegaly. There is no tenderness. There is no rebound, no guarding and no CVA tenderness.  Musculoskeletal: Normal range of motion. He exhibits no edema and no tenderness.  Lymphadenopathy:    He has no cervical adenopathy.  Neurological: He is oriented to person, place, and time.  Skin: Skin is warm and dry. No rash noted. He is not diaphoretic. No erythema. No pallor.  Psychiatric: He has a normal mood and affect. His behavior is normal. Judgment and thought content normal.     Lab Results  Component Value Date   WBC 10.9* 01/13/2012   HGB 13.8 01/13/2012   HCT 41.4 01/13/2012   PLT 198.0 01/13/2012   GLUCOSE 83 03/31/2012   CHOL 189 01/13/2012   TRIG 188.0* 01/13/2012   HDL 13.50* 01/13/2012   LDLCALC 138* 01/13/2012   ALT 68* 02/05/2012   AST 175* 02/05/2012   NA 135 03/31/2012   K 3.8 03/31/2012   CL 102 03/31/2012   CREATININE 0.9 03/31/2012   BUN 8 03/31/2012   CO2 27 03/31/2012   TSH 2.56 03/31/2012   INR 1.6* 02/05/2012   HGBA1C 4.5* 03/31/2012       Assessment & Plan:

## 2013-03-21 ENCOUNTER — Encounter: Payer: Self-pay | Admitting: Internal Medicine

## 2013-03-21 NOTE — Assessment & Plan Note (Signed)
He has been out of innopran for a few days so his pulse and BP are up today - I gave him samples to restart

## 2013-03-21 NOTE — Assessment & Plan Note (Signed)
He has not had any recent seizure activity, will cont valium as needed

## 2013-03-21 NOTE — Assessment & Plan Note (Signed)
No changes noted today 

## 2013-07-14 ENCOUNTER — Other Ambulatory Visit: Payer: Self-pay

## 2013-07-14 DIAGNOSIS — K7030 Alcoholic cirrhosis of liver without ascites: Secondary | ICD-10-CM

## 2013-07-14 DIAGNOSIS — R1110 Vomiting, unspecified: Secondary | ICD-10-CM

## 2013-07-14 MED ORDER — PROMETHAZINE HCL 12.5 MG PO TABS: 12.5000 mg | ORAL_TABLET | Freq: Four times a day (QID) | ORAL | Status: DC | PRN

## 2013-07-14 NOTE — Telephone Encounter (Signed)
Refill request for promethazine 12.5 mg done qty 50 3 refills

## 2013-07-16 ENCOUNTER — Ambulatory Visit (INDEPENDENT_AMBULATORY_CARE_PROVIDER_SITE_OTHER): Payer: Self-pay | Admitting: Internal Medicine

## 2013-07-16 ENCOUNTER — Encounter: Payer: Self-pay | Admitting: Internal Medicine

## 2013-07-16 VITALS — BP 118/78 | HR 66 | Temp 98.5°F | Resp 16 | Ht 72.0 in | Wt 160.8 lb

## 2013-07-16 DIAGNOSIS — I10 Essential (primary) hypertension: Secondary | ICD-10-CM

## 2013-07-16 DIAGNOSIS — R1110 Vomiting, unspecified: Secondary | ICD-10-CM

## 2013-07-16 DIAGNOSIS — K219 Gastro-esophageal reflux disease without esophagitis: Secondary | ICD-10-CM

## 2013-07-16 DIAGNOSIS — G609 Hereditary and idiopathic neuropathy, unspecified: Secondary | ICD-10-CM

## 2013-07-16 DIAGNOSIS — K7030 Alcoholic cirrhosis of liver without ascites: Secondary | ICD-10-CM

## 2013-07-16 DIAGNOSIS — N209 Urinary calculus, unspecified: Secondary | ICD-10-CM

## 2013-07-16 DIAGNOSIS — Z0000 Encounter for general adult medical examination without abnormal findings: Secondary | ICD-10-CM

## 2013-07-16 DIAGNOSIS — M19079 Primary osteoarthritis, unspecified ankle and foot: Secondary | ICD-10-CM

## 2013-07-16 DIAGNOSIS — R569 Unspecified convulsions: Secondary | ICD-10-CM

## 2013-07-16 DIAGNOSIS — J449 Chronic obstructive pulmonary disease, unspecified: Secondary | ICD-10-CM

## 2013-07-16 MED ORDER — DIAZEPAM 10 MG PO TABS: 5.0000 mg | ORAL_TABLET | Freq: Three times a day (TID) | ORAL | Status: DC | PRN

## 2013-07-16 NOTE — Progress Notes (Signed)
Subjective:    Patient ID: Jacob Hill, male    DOB: [birth year], 43 y.o.   MRN: [id number]  Arthritis Presents for follow-up visit. The disease course has been fluctuating. He complains of pain. He reports no stiffness, joint swelling or joint warmth. Affected locations include the left foot and right foot. His pain is at a severity of 3/10. Pertinent negatives include no diarrhea, dry eyes, dry mouth, dysuria, fatigue, fever, pain at night, pain while resting, rash, Raynaud's syndrome, uveitis or weight loss. His past medical history is significant for osteoarthritis. His pertinent risk factors include overuse. Past treatments include an opioid. The treatment provided moderate relief. Factors aggravating his arthritis include activity. Compliance with prior treatments has been good. Prior compliance problems include insurance issues.      Review of Systems  Constitutional: Negative.  Negative for fever, chills, weight loss, diaphoresis, appetite change and fatigue.  HENT: Negative.   Eyes: Negative.   Respiratory: Negative.  Negative for cough, choking, chest tightness, shortness of breath and stridor.   Cardiovascular: Negative.  Negative for chest pain, palpitations and leg swelling.  Gastrointestinal: Negative.  Negative for nausea, vomiting, abdominal pain, diarrhea, constipation, blood in stool and abdominal distention.  Endocrine: Negative.   Genitourinary: Negative.  Negative for dysuria.  Musculoskeletal: Positive for arthralgias and arthritis. Negative for back pain, gait problem, joint swelling, myalgias, neck pain, neck stiffness and stiffness.  Skin: Negative.  Negative for rash.  Allergic/Immunologic: Negative.   Neurological: Negative.  Negative for dizziness, tremors, seizures, weakness, light-headedness and numbness.  Hematological: Negative.  Negative for adenopathy. Does not bruise/bleed easily.  Psychiatric/Behavioral: Positive for dysphoric mood. Negative for  suicidal ideas, hallucinations, behavioral problems, confusion, sleep disturbance, self-injury, decreased concentration and agitation. The patient is not nervous/anxious and is not hyperactive.        Objective:   Physical Exam  Vitals reviewed. Constitutional: He is oriented to person, place, and time. He appears well-developed and well-nourished. No distress.  HENT:  Head: Normocephalic and atraumatic.  Mouth/Throat: Oropharynx is clear and moist. No oropharyngeal exudate.  Eyes: Conjunctivae are normal. Right eye exhibits no discharge. Left eye exhibits no discharge. No scleral icterus.  Neck: Normal range of motion. Neck supple. No JVD present. No tracheal deviation present. No thyromegaly present.  Cardiovascular: Normal rate, regular rhythm, normal heart sounds and intact distal pulses.  Exam reveals no gallop and no friction rub.   No murmur heard. Pulmonary/Chest: Effort normal and breath sounds normal. No stridor. No respiratory distress. He has no wheezes. He has no rales. He exhibits no tenderness.  Abdominal: Soft. Bowel sounds are normal. He exhibits no distension and no mass. There is no tenderness. There is no rebound and no guarding. Hernia confirmed negative in the right inguinal area and confirmed negative in the left inguinal area.  Genitourinary: Penis normal. Right testis shows no mass, no swelling and no tenderness. Right testis is descended. Left testis shows no mass, no swelling and no tenderness. Left testis is descended. Circumcised. No penile erythema or penile tenderness. No discharge found.  Musculoskeletal: Normal range of motion. He exhibits no edema and no tenderness.  Lymphadenopathy:    He has no cervical adenopathy.       Right: No inguinal adenopathy present.       Left: No inguinal adenopathy present.  Neurological: He is oriented to person, place, and time.  Skin: Skin is warm and dry. No rash noted. He is not diaphoretic. No erythema. No pallor.  Psychiatric: He has a normal mood and affect. His behavior is normal. Judgment and thought content normal. His mood appears not anxious. His affect is not angry, not blunt, not labile and not inappropriate. His speech is not rapid and/or pressured, not delayed and not tangential. He is not hyperactive, not slowed and not withdrawn. Cognition and memory are normal. He does not exhibit a depressed mood. He expresses no homicidal and no suicidal ideation. He expresses no suicidal plans and no homicidal plans. He is attentive.      Lab Results  Component Value Date   WBC 10.9* 01/13/2012   HGB 13.8 01/13/2012   HCT 41.4 01/13/2012   PLT 198.0 01/13/2012   GLUCOSE 83 03/31/2012   CHOL 189 01/13/2012   TRIG 188.0* 01/13/2012   HDL 13.50* 01/13/2012   LDLCALC 138* 01/13/2012   ALT 68* 02/05/2012   AST 175* 02/05/2012   NA 135 03/31/2012   K 3.8 03/31/2012   CL 102 03/31/2012   CREATININE 0.9 03/31/2012   BUN 8 03/31/2012   CO2 27 03/31/2012   TSH 2.56 03/31/2012   INR 1.6* 02/05/2012   HGBA1C 4.5* 03/31/2012      Assessment & Plan:

## 2013-07-16 NOTE — Patient Instructions (Signed)

## 2013-07-16 NOTE — Progress Notes (Signed)
Pre visit review using our clinic review tool, if applicable. No additional management support is needed unless otherwise documented below in the visit note. 

## 2013-07-18 NOTE — Assessment & Plan Note (Signed)
Exam done Labs ordered Vaccines were reviewed Pt ed material was given 

## 2013-07-18 NOTE — Assessment & Plan Note (Signed)
No seizure activity reported Will cont valium as needed

## 2013-07-18 NOTE — Assessment & Plan Note (Signed)
No changes noted here

## 2013-07-18 NOTE — Assessment & Plan Note (Signed)
His BP is well controlled 
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
## 2013-07-18 NOTE — Assessment & Plan Note (Signed)
I have asked him to see ortho about this to see if surgery will help

## 2013-07-21 ENCOUNTER — Emergency Department (INDEPENDENT_AMBULATORY_CARE_PROVIDER_SITE_OTHER): Payer: Self-pay

## 2013-07-21 ENCOUNTER — Encounter (HOSPITAL_COMMUNITY): Payer: Self-pay | Admitting: Emergency Medicine

## 2013-07-21 ENCOUNTER — Emergency Department (HOSPITAL_COMMUNITY)
Admission: EM | Admit: 2013-07-21 | Discharge: 2013-07-21 | Disposition: A | Discharge status: Home / Self Care | Payer: Self-pay | Source: Home / Self Care

## 2013-07-21 DIAGNOSIS — S92331A Displaced fracture of third metatarsal bone, right foot, initial encounter for closed fracture: Secondary | ICD-10-CM

## 2013-07-21 DIAGNOSIS — IMO0002 Reserved for concepts with insufficient information to code with codable children: Secondary | ICD-10-CM

## 2013-07-21 DIAGNOSIS — S92309A Fracture of unspecified metatarsal bone(s), unspecified foot, initial encounter for closed fracture: Secondary | ICD-10-CM

## 2013-07-21 MED ORDER — HYDROCODONE-ACETAMINOPHEN 7.5-325 MG PO TABS: 1.0000 | ORAL_TABLET | ORAL | Status: DC | PRN
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
## 2013-07-21 NOTE — Discharge Instructions (Signed)
Lisfranc's Fracture-Dislocation and Mid-Foot Sprain with Rehab Lisfranc's fracture-dislocation is an injury to the mid-foot. The injury includes ligament tearing (sprains), although not usually complete, with or without break (fracture) of the bones of the mid-foot (metatarsal bones). These structures are important for establishing the arch of the foot.  SYMPTOMS   Sharp pain in the foot.  Pain that gets worse when standing or walking on the injured foot.  Tenderness, swelling, and bruising (contusion) at the injury site.  Numbness or paralysis from swelling in the foot, causing pressure on the blood vessels or nerves (uncommon). CAUSES  Lisfranc's fracture-dislocation injuries occur when a force is placed on the foot that is greater than the ligaments and bones can handle. Common causes of injury include:  Direct hit (trauma) to the mid-foot.  Twisting injury.  Landing on with the foot in an improper position.  Falling when another player is standing on your foot. RISK INCREASES WITH:  Participation in contact sports (football, rugby).  Sports that require jumping and landing (basketball, volleyball).  Sports in which cleats are worn on shoes and sliding occurs.  Previous foot or ankle sprains, dislocations, or repeated injury to any joint in the foot.  Poor strength and flexibility. PREVENTION  Warm up and stretch properly before activity.  Maintain physical fitness:  Strength, flexibility, and endurance.  Cardiovascular fitness.  When participating in jumping (basketball, volleyball) or contact sports, protect vulnerable joints with supportive devices (wrapped elastic bandages, tape, braces, high-top athletic shoes).  Use cleats or spikes of appropriate length for the sport and turf or field conditions.  Wear properly fitted and padded protective equipment. PROGNOSIS  If treated properly, Lisfranc's fracture-dislocations usually heal within 8 to 12 weeks. You may  experience residual pain and stiffness of the mid-foot after this injury.  RELATED COMPLICATIONS   Prolonged healing or recurring dislocation, if activity is resumed too soon.  Fracture fails to heal (nonunion).  Fracture heals in a poor position (malunion).  Chronic pain, stiffness, or swelling of the foot.  Excessive bleeding in the foot, causing pressure and injury to nerves and blood vessels (rare).  Unstable or arthritic joint, following repeated injury or delayed treatment.  Future surgery, to fuse the joints of the mid-foot, due to chronic pain or stiffness. TREATMENT  Treatment first involves the use of ice and medicine, to reduce pain and inflammation. If the fracture is displaced (bone fragments out of alignment), immediate realigning of the bone (reduction) by a person trained in the procedure is required. Fractures that cannot be realigned by hand, or are open (bones protrude through the skin), may require surgery to hold the fracture in place with screws, pins, and plates. Once the bones are in proper alignment, the foot must be restrained for 2 to 8 weeks. After restraint, it is important to perform strengthening and stretching exercises to regain strength and a full range of motion. These exercises may be completed at home or with a therapist. MEDICATION   If pain medicine is needed, nonsteroidal anti-inflammatory medicines (aspirin and ibuprofen), or other minor pain relievers (acetaminophen), are often advised.  Do not take pain medicine for 7 days before surgery.  Prescription pain relievers may be given, if your caregiver thinks they are needed. Use only as directed and only as much as you need. HEAT AND COLD  Cold treatment (icing) should be applied for 10 to 15 minutes every 2 to 3 hours for inflammation and pain, and immediately after activity that aggravates your symptoms. Use ice  packs or an ice massage.  Heat treatment may be used before performing stretching and  strengthening activities prescribed by your caregiver, physical therapist, or athletic trainer. Use a heat pack or a warm water soak. SEEK MEDICAL CARE IF:   Pain, tenderness, or swelling gets worse, despite treatment.  You experience pain, numbness, or coldness in the foot.  Blue, gray, or dark color appears in the toenails.  Any of the following occur after surgery: fever, increased pain, swelling, redness, drainage of fluids, or bleeding in the affected area.  New, unexplained symptoms develop. (Drugs used in treatment may produce side effects.) EXERCISES                                                                                                                                        Metatarsal Fracture, Undisplaced A metatarsal fracture is a break in the bone(s) of the foot. These are the bones of the foot that connect your toes to the bones of the ankle. DIAGNOSIS  The diagnoses of these fractures are usually made with X-rays. If there are problems in the forefoot and x-rays are normal a later bone scan will usually make the diagnosis.  TREATMENT AND HOME CARE INSTRUCTIONS  Treatment may or may not include a cast or walking shoe. When casts are needed the use is usually for short periods of time so as not to slow down healing with muscle wasting (atrophy).  Activities should be stopped until further advised by your caregiver.  Wear shoes with adequate shock absorbing capabilities and stiff soles.  Alternative exercise may be undertaken while waiting for healing. These may include bicycling and swimming, or as your caregiver suggests.  It is important to keep all follow-up visits or specialty referrals. The failure to keep these appointments could result in improper bone healing and chronic pain or disability.  Warning: Do not drive a car or operate a motor vehicle until your caregiver specifically tells you it is safe to do so. IF YOU DO NOT HAVE A CAST OR SPLINT:  You may  walk on your injured foot as tolerated or advised.  Do not put any weight on your injured foot for as long as directed by your caregiver. Slowly increase the amount of time you walk on the foot as the pain allows or as advised.  Use crutches until you can bear weight without pain. A gradual increase in weight bearing may help.  Apply ice to the injury for 15-20 minutes each hour while awake for the first 2 days. Put the ice in a plastic bag and place a towel between the bag of ice and your skin.  Only take over-the-counter or prescription medicines for pain, discomfort, or fever as directed by your caregiver. SEEK IMMEDIATE MEDICAL CARE IF:   Your cast gets damaged or breaks.  You have continued severe pain or more swelling than you did before the  cast was put on, or the pain is not controlled with medications.  Your skin or nails below the injury turn blue or grey, or feel cold or numb.  There is a bad smell, or new stains or pus-like (purulent) drainage coming from the cast. MAKE SURE YOU:   Understand these instructions.  Will watch your condition.  Will get help right away if you are not doing well or get worse. Document Released: 10/27/2001 Document Revised: 04/29/2011 Document Reviewed: 09/18/2007 [facility] Patient Information 2014 [place], [place]. DO NOT START THESE AT THIS TIME RANGE OF MOTION (ROM) AND STRETCHING EXERCISES - Lisfranc's Fracture-Dislocation and Mid-Foot Sprain These exercises may help you when beginning to rehabilitate your injury. Your symptoms may resolve with or without further involvement from your physician, physical therapist or athletic trainer. While completing these exercises, remember:   Restoring tissue flexibility helps normal motion to return to the joints. This allows healthier, less painful movement and activity.  An effective stretch should be held for at least 30 seconds.  A stretch should never be painful. You should only feel a gentle  lengthening or release in the stretched tissue. RANGE OF MOTION - Dorsi/Plantar Flexion  While sitting with your right / left knee straight, draw the top of your foot upwards by flexing your ankle. Then reverse the motion, pointing your toes downward.  Hold each position for __________ seconds.  After completing your first set of exercises, repeat this exercise with your knee bent. Repeat __________ times. Complete this exercise __________ times per day.  RANGE OF MOTION - Ankle Plantar Flexion   Sit with your right / left leg crossed over your opposite knee.  Use your opposite hand to pull the top of your foot and toes toward you.  You should feel a gentle stretch on the top of your foot and ankle. Hold this position for __________ seconds. Repeat __________ times. Complete __________ times per day.  RANGE OF MOTION - Ankle Inversion   Sit with your right / left ankle crossed over your opposite knee.  Grip your foot with your opposite hand, placing your thumb on the bottom of your foot and your fingers across the top of your foot.  Gently pull your foot so the smallest toe comes toward you and your thumb pushes the inside of the ball of your foot away from you.  You should feel a gentle stretch on the outside of your ankle. Hold the stretch for __________ seconds. Repeat __________ times. Complete this exercise __________ times per day.  RANGE OF MOTION - Ankle Alphabet  Imagine your right / left big toe is a pen.  Keeping your hip and knee still, write out the entire alphabet with your "pen." Make the letters as large as you can, without increasing any discomfort. Repeat __________ times. Complete this exercise __________ times per day.  RANGE OF MOTION - Ankle Dorsiflexion, Active Assisted   Remove your shoes and sit on a chair, preferably not on a carpeted surface.  Place your right / left foot directly under the knee. Extend your opposite leg for support.  Keeping your  heel down, slide your right / left foot back toward the chair, until you feel a stretch at your ankle or calf. If you do not feel a stretch, slide your bottom forward to the edge of the chair while still keeping your heel down.  Hold this stretch for __________ seconds. Repeat __________ times. Complete this stretch __________ times per day.  STRETCH  Gastroc,  Standing   Place your hands on a wall.  Extend your right / left leg behind you, and place a folded washcloth under the arch of your foot for support. Keep the front knee somewhat bent.  Slightly point your toes inward on your back foot.  Keeping your right / left heel on the floor and your knee straight, shift your weight toward the wall, not allowing your back to arch.  You should feel a gentle stretch in the right / left calf. Hold this position for __________ seconds. Repeat __________ times. Complete this stretch __________ times per day. STRETCH  Soleus, Standing   Place your hands on a wall.  Extend your right / left leg behind you, and place a folded washcloth under the arch of your foot for support. Keep the front knee somewhat bent.  Slightly point your toes inward on your back foot.  Keep your right / leftheel on the floor, bend your back knee, and slightly shift your weight over the back leg, so that you feel a gentle stretch deep in your back calf.  Hold this position for __________ seconds. Repeat __________ times. Complete this stretch __________ times per day. STRENGTHENING EXERCISES - Lisfranc's Fracture-Dislocation and Mid-Foot Sprain These exercises may help you when beginning to rehabilitate your injury. They may resolve your symptoms with or without further involvement from your physician, physical therapist or athletic trainer. While completing these exercises, remember:   Muscles can gain both the endurance and the strength needed for everyday activities through controlled exercises.  Complete these  exercises as instructed by your physician, physical therapist or athletic trainer. Increase the resistance and repetitions only as guided by your caregiver. STRENGTH - Dorsiflexors  Secure a rubber exercise band or tubing to a fixed object (table, pole) and loop the other end around your right / left foot.  Sit on the floor facing the fixed object. The band should be slightly tense when your foot is relaxed.  Slowly draw your foot back toward you, using your ankle and toes.  Hold this position for __________ seconds. Slowly release the tension in the band and return your foot to the starting position. Repeat __________ times. Complete this exercise __________ times per day.  STRENGTH - Plantar-flexors   Sit with your right / left leg extended. Holding onto both ends of a rubber exercise band or tubing, loop it around the ball of your foot. Keep a slight tension in the band.  Slowly push your toes away from you, pointing them downward.  Hold this position for __________ seconds. Return slowly, controlling the tension in the band/tubing. Repeat __________ times. Complete this exercise __________ times per day.  STRENGTH - Plantar-flexors, Standing   Stand with your feet shoulder width apart. Steady yourself with a wall or table, using as little support as needed.  Keeping your weight evenly spread over the width of your feet, rise up on your toes.*  Hold this position for __________ seconds. Repeat __________ times. Complete this exercise __________ times per day.  *If this is too easy, shift your weight toward your right / left leg until you feel challenged. Ultimately, you may be asked to do this exercise while standing on your right / left foot only. STRENGTH - Towel Curls  Sit in a chair, on a non-carpeted surface.  Place your foot on a towel, keeping your heel on the floor.  Pull the towel toward your heel only by curling your toes. Keep your heel on the floor.  If instructed  by your physician, physical therapist or athletic trainer, you may add weight at the end of the towel. Repeat __________ times. Complete this exercise __________ times per day. STRENGTH - Ankle Eversion   Secure one end of a rubber exercise band or tubing to a fixed object (table, pole). Loop the other end around your foot, just before your toes.  Place your fists between your knees. This will focus your strengthening at your ankle.  Drawing the band across your opposite foot, away from the pole, slowly, pull your little toe out and up. Make sure the band is positioned to resist the entire motion.  Hold this position for __________ seconds.  Have your muscles resist the band, as it slowly pulls your foot back to the starting position. Repeat __________ times. Complete this exercise __________ times per day.  STRENGTH - Ankle Inversion   Secure one end of a rubber exercise band or tubing to a fixed object (table, pole). Loop the other end around your foot, just before your toes.  Place your fists between your knees. This will focus your strengthening at your ankle.  Slowly, pull your big toe up and in, making sure the band is positioned to resist the entire motion.  Hold this position for __________ seconds.  Have your muscles resist the band, as it slowly pulls your foot back to the starting position. Repeat __________ times. Complete this exercises __________ times per day.  Document Released: 02/04/2005 Document Revised: 04/29/2011 Document Reviewed: 05/19/2008 [facility] Patient Information 2014 [place], [place].

## 2013-07-21 NOTE — ED Notes (Signed)
Has his own boot and crutches
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
## 2013-07-21 NOTE — ED Notes (Signed)
Patient has his own

## 2013-07-22 NOTE — ED Provider Notes (Signed)
Medical screening examination/treatment/procedure(s) were performed by non-physician practitioner and as supervising physician I was immediately available for consultation/collaboration.  Ruwayda Curet, M.D.   Bolivar Koranda C Delcenia Inman, MD 07/22/13 0814 

## 2013-07-26 ENCOUNTER — Other Ambulatory Visit: Payer: Self-pay

## 2013-07-26 DIAGNOSIS — R1110 Vomiting, unspecified: Secondary | ICD-10-CM

## 2013-07-26 DIAGNOSIS — K7030 Alcoholic cirrhosis of liver without ascites: Secondary | ICD-10-CM

## 2013-07-26 MED ORDER — PROMETHAZINE HCL 12.5 MG PO TABS: 12.5000 mg | ORAL_TABLET | Freq: Four times a day (QID) | ORAL | Status: DC | PRN

## 2013-08-10 ENCOUNTER — Encounter: Payer: Self-pay | Admitting: Internal Medicine

## 2013-09-11 ENCOUNTER — Emergency Department (HOSPITAL_BASED_OUTPATIENT_CLINIC_OR_DEPARTMENT_OTHER)
Admission: EM | Admit: 2013-09-11 | Discharge: 2013-09-11 | Disposition: A | Discharge status: Home / Self Care | Payer: Self-pay | Attending: Emergency Medicine | Admitting: Emergency Medicine

## 2013-09-11 ENCOUNTER — Encounter (HOSPITAL_BASED_OUTPATIENT_CLINIC_OR_DEPARTMENT_OTHER): Payer: Self-pay | Admitting: Emergency Medicine

## 2013-09-11 DIAGNOSIS — J449 Chronic obstructive pulmonary disease, unspecified: Secondary | ICD-10-CM | POA: Insufficient documentation

## 2013-09-11 DIAGNOSIS — K219 Gastro-esophageal reflux disease without esophagitis: Secondary | ICD-10-CM | POA: Insufficient documentation

## 2013-09-11 DIAGNOSIS — Z79899 Other long term (current) drug therapy: Secondary | ICD-10-CM | POA: Insufficient documentation

## 2013-09-11 DIAGNOSIS — Y9389 Activity, other specified: Secondary | ICD-10-CM | POA: Insufficient documentation

## 2013-09-11 DIAGNOSIS — G40909 Epilepsy, unspecified, not intractable, without status epilepticus: Secondary | ICD-10-CM | POA: Insufficient documentation

## 2013-09-11 DIAGNOSIS — F17200 Nicotine dependence, unspecified, uncomplicated: Secondary | ICD-10-CM | POA: Insufficient documentation

## 2013-09-11 DIAGNOSIS — Z8739 Personal history of other diseases of the musculoskeletal system and connective tissue: Secondary | ICD-10-CM | POA: Insufficient documentation

## 2013-09-11 DIAGNOSIS — S91309A Unspecified open wound, unspecified foot, initial encounter: Secondary | ICD-10-CM | POA: Insufficient documentation

## 2013-09-11 DIAGNOSIS — I10 Essential (primary) hypertension: Secondary | ICD-10-CM | POA: Insufficient documentation

## 2013-09-11 DIAGNOSIS — J4489 Other specified chronic obstructive pulmonary disease: Secondary | ICD-10-CM | POA: Insufficient documentation

## 2013-09-11 DIAGNOSIS — S91311A Laceration without foreign body, right foot, initial encounter: Secondary | ICD-10-CM

## 2013-09-11 DIAGNOSIS — Y9289 Other specified places as the place of occurrence of the external cause: Secondary | ICD-10-CM | POA: Insufficient documentation

## 2013-09-11 DIAGNOSIS — W292XXA Contact with other powered household machinery, initial encounter: Secondary | ICD-10-CM | POA: Insufficient documentation

## 2013-09-11 HISTORY — DX: Unspecified osteoarthritis, unspecified site: M19.90

## 2013-09-11 HISTORY — DX: Essential (primary) hypertension: I10

## 2013-09-11 HISTORY — DX: Polyneuropathy, unspecified: G62.9

## 2013-09-11 NOTE — Discharge Instructions (Signed)
Sutured Wound Care °Sutures are stitches that can be used to close wounds. Wound care helps prevent pain and infection.  °HOME CARE INSTRUCTIONS  °· Rest and elevate the injured area until all the pain and swelling are gone. °· Only take over-the-counter or prescription medicines for pain, discomfort, or fever as directed by your caregiver. °· After 48 hours, gently wash the area with mild soap and water once a day, or as directed. Rinse off the soap. Pat the area dry with a clean towel. Do not rub the wound. This may cause bleeding. °· Follow your caregiver's instructions for how often to change the bandage (dressing). Stop using a dressing after 2 days or after the wound stops draining. °· If the dressing sticks, moisten it with soapy water and gently remove it. °· Apply ointment on the wound as directed. °· Avoid stretching a sutured wound. °· Drink enough fluids to keep your urine clear or pale yellow. °· Follow up with your caregiver for suture removal as directed. °· Use sunscreen on your wound for the next 3 to 6 months so the scar will not darken. °SEEK IMMEDIATE MEDICAL CARE IF:  °· Your wound becomes red, swollen, hot, or tender. °· You have increasing pain in the wound. °· You have a red streak that extends from the wound. °· There is pus coming from the wound. °· You have a fever. °· You have shaking chills. °· There is a bad smell coming from the wound. °· You have persistent bleeding from the wound. °MAKE SURE YOU:  °· Understand these instructions. °· Will watch your condition. °· Will get help right away if you are not doing well or get worse. °Document Released: 03/14/2004 Document Revised: 04/29/2011 Document Reviewed: 06/10/2010 °[employer]® Patient Information ©2015 [employer]. This information is not intended to replace advice given to you by your health care provider. Make sure you discuss any questions you have with your health care provider. ° °

## 2013-09-11 NOTE — ED Notes (Signed)
Pt states that was walking through his living room when he stepped on a knife. States he has a hx of neuropathy and does not have pain to injured area. Active bleeding laceration noted between great toe and 2nd toe. Bleed controlled with pressure.

## 2013-09-11 NOTE — ED Provider Notes (Signed)
CSN: [id number]     Arrival date & time 09/11/13  2138 History   First MD Initiated Contact with Patient 09/11/13 2145     Chief Complaint  Patient presents with  . Extremity Laceration     (Consider location/radiation/quality/duration/timing/severity/associated sxs/prior Treatment) Patient is a 43 y.o. male presenting with skin laceration. The history is provided by the patient. No language interpreter was used.  Laceration Location:  Foot Foot laceration location:  R toes Depth:  Through dermis Laceration mechanism:  Metal edge Foreign body present:  No foreign bodies Tetanus status:  Up to date   Past Medical History  Diagnosis Date  . COPD (chronic obstructive pulmonary disease)   . GERD (gastroesophageal reflux disease)   . Osteoarthritis   . Seizure disorder   . Seizures   . Neuropathy   . Hypertension   . Cirrhosis   . DJD (degenerative joint disease)    History reviewed. No pertinent past surgical history. Family History  Problem Relation Age of Onset  . Diabetes Other   . Hypertension Other   . Diabetes Father    History  Substance Use Topics  . Smoking status: Current Every Day Smoker -- 2.00 packs/day for 15 years    Types: Cigarettes  . Smokeless tobacco: Never Used  . Alcohol Use: Yes     Comment: occasional     Review of Systems  Constitutional: Negative for fever.  Skin: Positive for wound.      Allergies  Codeine and Nsaids  Home Medications   Prior to Admission medications   Medication Sig Start Date End Date Taking? Authorizing Provider  diazepam (VALIUM) 10 MG tablet Take 0.5 tablets (5 mg total) by mouth every 8 (eight) hours as needed for anxiety. 07/16/13   Etta Grandchildhomas L Jones, MD  HYDROcodone-acetaminophen (NORCO) 7.5-325 MG per tablet Take 1 tablet by mouth every 4 (four) hours as needed. 07/21/13   Hayden Rasmussenavid Mabe, NP  KLOR-CON M20 20 MEQ tablet take 1 tablet by mouth twice a day 09/25/12   Etta Grandchildhomas L Jones, MD  Linaclotide [facility](LINZESS) 145 MCG  CAPS Take 1 capsule (145 mcg total) by mouth daily. 03/31/12   Etta Grandchildhomas L Jones, MD  methadone (DISKETS) 40 MG disintegrating tablet Take 15 mg by mouth every morning. Dispensed by [facility]    Historical Provider, MD  promethazine (PHENERGAN) 12.5 MG tablet Take 1 tablet (12.5 mg total) by mouth every 6 (six) hours as needed for nausea or vomiting. 07/26/13   Etta Grandchildhomas L Jones, MD  propranolol (INNOPRAN XL) 80 MG 24 hr capsule Take 1 capsule (80 mg total) by mouth at bedtime. 03/19/13   Etta Grandchildhomas L Jones, MD   BP 117/82  Pulse 98  Temp(Src) 98.2 F (36.8 C) (Oral)  Resp 18  Ht 6' (1.829 m)  Wt 170 lb (77.111 kg)  BMI 23.05 kg/m2  SpO2 96% Physical Exam  Constitutional: He is oriented to person, place, and time. He appears well-developed and well-nourished. No distress.  Acutely intoxicated.  Musculoskeletal:  3 cm laceration at the medial base of 2nd toe, actively bleeding. FROM toes of the foot. Non-tender.   Neurological: He is alert and oriented to person, place, and time.    ED Course  Procedures (including critical care time) Labs Review Labs Reviewed - No data to display  Imaging Review No results found.   EKG Interpretation None      MDM   Final diagnoses:  None    1. Foot laceration  LACERATION REPAIR  Performed by: Elpidio Anis A Authorized by: Elpidio Anis A Consent: Verbal consent obtained. Risks and benefits: risks, benefits and alternatives were discussed Consent given by: patient Patient identity confirmed: provided demographic data Prepped and Draped in normal sterile fashion Wound explored  Laceration Location: right 2nd toe  Laceration Length: 3cm  No Foreign Bodies seen or palpated  Anesthesia: local infiltration  Local anesthetic: lidocaine 2% w/o epinephrine  Anesthetic total: 2 ml  Irrigation method: syringe Amount of cleaning: standard  Skin closure: 4-0 prolene  Number of sutures: 6  Technique: simple  interrupted.  Patient tolerance: Patient tolerated the procedure well with no immediate complications.     Arnoldo Hooker, PA-C 09/11/13 2219

## 2013-09-12 NOTE — ED Provider Notes (Signed)
Medical screening examination/treatment/procedure(s) were performed by non-physician practitioner and as supervising physician I was immediately available for consultation/collaboration.     Geoffery Lyonsouglas Brittnee Gaetano, MD 09/12/13 1525

## 2013-09-18 DIAGNOSIS — D696 Thrombocytopenia, unspecified: Secondary | ICD-10-CM

## 2013-09-18 HISTORY — DX: Thrombocytopenia, unspecified: D69.6

## 2013-09-23 ENCOUNTER — Ambulatory Visit (INDEPENDENT_AMBULATORY_CARE_PROVIDER_SITE_OTHER): Payer: Self-pay | Admitting: Internal Medicine

## 2013-09-23 ENCOUNTER — Encounter: Payer: Self-pay | Admitting: Internal Medicine

## 2013-09-23 ENCOUNTER — Other Ambulatory Visit (INDEPENDENT_AMBULATORY_CARE_PROVIDER_SITE_OTHER): Payer: Self-pay

## 2013-09-23 ENCOUNTER — Telehealth: Payer: Self-pay | Admitting: *Deleted

## 2013-09-23 VITALS — BP 107/68 | HR 76 | Temp 98.3°F | Resp 16 | Ht 72.0 in | Wt 155.0 lb

## 2013-09-23 DIAGNOSIS — Z23 Encounter for immunization: Secondary | ICD-10-CM

## 2013-09-23 DIAGNOSIS — Z0000 Encounter for general adult medical examination without abnormal findings: Secondary | ICD-10-CM

## 2013-09-23 DIAGNOSIS — Z5189 Encounter for other specified aftercare: Secondary | ICD-10-CM

## 2013-09-23 LAB — COMPREHENSIVE METABOLIC PANEL
ALT: 32 U/L (ref 0–53)
AST: 110 U/L — ABNORMAL HIGH (ref 0–37)
Albumin: 3 g/dL — ABNORMAL LOW (ref 3.5–5.2)
Alkaline Phosphatase: 76 U/L (ref 39–117)
BUN: 10 mg/dL (ref 6–23)
CO2: 26 mEq/L (ref 19–32)
Calcium: 8.9 mg/dL (ref 8.4–10.5)
Chloride: 103 mEq/L (ref 96–112)
Creatinine, Ser: 0.7 mg/dL (ref 0.4–1.5)
GFR: 135.37 mL/min (ref >60.00)
Glucose, Bld: 78 mg/dL (ref 70–99)
Potassium: 4.2 mEq/L (ref 3.5–5.1)
Sodium: 135 mEq/L (ref 135–145)
Total Bilirubin: 3.9 mg/dL — ABNORMAL HIGH (ref 0.2–1.2)
Total Protein: 7.3 g/dL (ref 6.0–8.3)

## 2013-09-23 LAB — CBC WITH DIFFERENTIAL/PLATELET
Basophils Absolute: 0 10*3/uL (ref 0.0–0.1)
Basophils Relative: 0.3 % (ref 0.0–3.0)
Eosinophils Absolute: 0.1 10*3/uL (ref 0.0–0.7)
Eosinophils Relative: 1.8 % (ref 0.0–5.0)
HCT: 32.7 % — ABNORMAL LOW (ref 39.0–52.0)
Hemoglobin: 11 g/dL — ABNORMAL LOW (ref 13.0–17.0)
Lymphocytes Relative: 33 % (ref 12.0–46.0)
Lymphs Abs: 1.8 10*3/uL (ref 0.7–4.0)
MCHC: 33.5 g/dL (ref 30.0–36.0)
MCV: 102.9 fl — ABNORMAL HIGH (ref 78.0–100.0)
Monocytes Absolute: 0.7 10*3/uL (ref 0.1–1.0)
Monocytes Relative: 13.8 % — ABNORMAL HIGH (ref 3.0–12.0)
Neutro Abs: 2.7 10*3/uL (ref 1.4–7.7)
Neutrophils Relative %: 51.1 % (ref 43.0–77.0)
Platelets: 99 10*3/uL — ABNORMAL LOW (ref 150.0–400.0)
RBC: 3.18 Mil/uL — ABNORMAL LOW (ref 4.22–5.81)
RDW: 18.1 % — ABNORMAL HIGH (ref 11.5–15.5)
WBC: 5.4 10*3/uL (ref 4.0–10.5)

## 2013-09-23 LAB — TSH: TSH: 2.59 u[IU]/mL (ref 0.35–4.50)

## 2013-09-23 NOTE — Progress Notes (Signed)
Pre visit review using our clinic review tool, if applicable. No additional management support is needed unless otherwise documented below in the visit note. 

## 2013-09-23 NOTE — Telephone Encounter (Signed)
Pt stated he forgot what md advise him to do concerning his tie. Does he need to leave it open or put a bandage on it...Raechel Chute/lmb

## 2013-09-23 NOTE — Progress Notes (Signed)
   Subjective:    Patient ID: Jacob Hill, male    DOB: 10/21/1970, 43 y.o.   MRN: [id number]  Wound Check He was originally treated 10 to 14 days ago. Previous treatment included laceration repair. His temperature was unmeasured prior to arrival. There has been no drainage from the wound. There is no redness present. There is no swelling present. The pain has no pain. He has no difficulty moving the affected extremity or digit.      Review of Systems  Constitutional: Negative.  Negative for fever, chills, diaphoresis, appetite change and fatigue.  HENT: Negative.   Eyes: Negative.   Respiratory: Negative.  Negative for cough, choking, chest tightness, shortness of breath and stridor.   Cardiovascular: Negative.  Negative for chest pain, palpitations and leg swelling.  Gastrointestinal: Negative.  Negative for nausea, vomiting, abdominal pain, diarrhea, constipation and blood in stool.  Endocrine: Negative.   Genitourinary: Negative.  Negative for hematuria and scrotal swelling.  Musculoskeletal: Negative.   Skin: Negative.  Negative for rash.  Allergic/Immunologic: Negative.   Neurological: Negative.  Negative for dizziness.  Hematological: Negative.  Negative for adenopathy. Does not bruise/bleed easily.  Psychiatric/Behavioral: Negative.   All other systems reviewed and are negative.      Objective:   Physical Exam  Vitals reviewed. Constitutional: He is oriented to person, place, and time. He appears well-developed and well-nourished.  Non-toxic appearance. He does not have a sickly appearance. He does not appear ill. No distress.  HENT:  Head: Normocephalic and atraumatic.  Mouth/Throat: Oropharynx is clear and moist. No oropharyngeal exudate.  Eyes: Conjunctivae are normal. Right eye exhibits no discharge. Left eye exhibits no discharge. No scleral icterus.  Neck: Normal range of motion. Neck supple. No JVD present. No tracheal deviation present. No thyromegaly  present.  Cardiovascular: Normal rate, regular rhythm, normal heart sounds and intact distal pulses.  Exam reveals no gallop and no friction rub.   No murmur heard. Pulmonary/Chest: Effort normal and breath sounds normal. No stridor. No respiratory distress. He has no wheezes. He has no rales. He exhibits no tenderness.  Abdominal: Soft. Bowel sounds are normal. He exhibits no distension and no mass. There is no tenderness. There is no rebound and no guarding.  Musculoskeletal: He exhibits no edema and no tenderness.  Plantar side, right foot, 2nd toe base there are 6 simple sutures in place. The wound looks good with no tenderness, warmth, swelling, erythema, exudate, or FB.  Lymphadenopathy:    He has no cervical adenopathy.  Neurological: He is oriented to person, place, and time.  Skin: Skin is warm and dry. No rash noted. He is not diaphoretic. No erythema. No pallor.          Assessment & Plan:

## 2013-09-23 NOTE — Telephone Encounter (Signed)
Leave it open

## 2013-09-24 NOTE — Telephone Encounter (Signed)
Tried calling pt again still no answer & can't leave msg...potassium.Marland Kitchen.Raechel Chute/lmb

## 2013-09-24 NOTE — Telephone Encounter (Signed)
Called pt no answer can't leave msg due to vm not set-up.../lmb 

## 2013-09-24 NOTE — Telephone Encounter (Signed)
Tried calling pt again still no answer. Closing encounter,,,/lmb

## 2013-09-27 ENCOUNTER — Telehealth: Payer: Self-pay | Admitting: Internal Medicine

## 2013-09-27 ENCOUNTER — Encounter: Payer: Self-pay | Admitting: Internal Medicine

## 2013-09-27 DIAGNOSIS — Z5189 Encounter for other specified aftercare: Secondary | ICD-10-CM | POA: Insufficient documentation

## 2013-09-27 NOTE — Telephone Encounter (Signed)
Can you annotate labs?

## 2013-09-27 NOTE — Telephone Encounter (Signed)
Patient is asking for a call back regarding lab results. Wants to know what certain things mean. Please advise.

## 2013-09-27 NOTE — Telephone Encounter (Signed)
He is anemic, I am concerned that he may have a vitamin defic or intestinal blood loss, needs further evaluation

## 2013-09-27 NOTE — Assessment & Plan Note (Signed)
Sutures were removed The would is healing well

## 2013-09-28 NOTE — Telephone Encounter (Signed)
Unable to leave a message, will try again at a later time

## 2013-09-29 NOTE — Telephone Encounter (Signed)
Unable to leave a message, will try again later

## 2013-09-30 ENCOUNTER — Other Ambulatory Visit: Payer: Self-pay

## 2013-09-30 MED ORDER — POTASSIUM CHLORIDE CRYS ER 20 MEQ PO TBCR: EXTENDED_RELEASE_TABLET | ORAL | Status: DC

## 2013-10-26 ENCOUNTER — Telehealth: Payer: Self-pay | Admitting: Internal Medicine

## 2013-10-26 NOTE — Telephone Encounter (Signed)
He can stop it but should seek out a high potassium diet

## 2013-10-26 NOTE — Telephone Encounter (Signed)
Pt called stated Klor-Con that Dr.Jones gave him to take is making him go to the restroom a lot, pt is very uncomfortable and was wondering what to do at this point? Please call pt

## 2013-10-26 NOTE — Telephone Encounter (Signed)
lmtcb

## 2013-11-03 ENCOUNTER — Other Ambulatory Visit (INDEPENDENT_AMBULATORY_CARE_PROVIDER_SITE_OTHER): Payer: Self-pay

## 2013-11-03 ENCOUNTER — Encounter: Payer: Self-pay | Admitting: Internal Medicine

## 2013-11-03 ENCOUNTER — Ambulatory Visit (INDEPENDENT_AMBULATORY_CARE_PROVIDER_SITE_OTHER): Payer: Self-pay | Admitting: Internal Medicine

## 2013-11-03 VITALS — BP 128/80 | HR 76 | Temp 97.8°F | Resp 16 | Ht 72.0 in | Wt 153.0 lb

## 2013-11-03 DIAGNOSIS — D510 Vitamin B12 deficiency anemia due to intrinsic factor deficiency: Secondary | ICD-10-CM

## 2013-11-03 DIAGNOSIS — K7030 Alcoholic cirrhosis of liver without ascites: Secondary | ICD-10-CM

## 2013-11-03 DIAGNOSIS — I10 Essential (primary) hypertension: Secondary | ICD-10-CM

## 2013-11-03 DIAGNOSIS — Z23 Encounter for immunization: Secondary | ICD-10-CM

## 2013-11-03 LAB — CBC WITH DIFFERENTIAL/PLATELET
Basophils Absolute: 0 10*3/uL (ref 0.0–0.1)
Basophils Relative: 0.6 % (ref 0.0–3.0)
Eosinophils Absolute: 0.2 10*3/uL (ref 0.0–0.7)
Eosinophils Relative: 3.5 % (ref 0.0–5.0)
HCT: 37.4 % — ABNORMAL LOW (ref 39.0–52.0)
Hemoglobin: 12.5 g/dL — ABNORMAL LOW (ref 13.0–17.0)
Lymphocytes Relative: 34.2 % (ref 12.0–46.0)
Lymphs Abs: 1.9 10*3/uL (ref 0.7–4.0)
MCHC: 33.4 g/dL (ref 30.0–36.0)
MCV: 102.3 fl — ABNORMAL HIGH (ref 78.0–100.0)
Monocytes Absolute: 0.6 10*3/uL (ref 0.1–1.0)
Monocytes Relative: 10.6 % (ref 3.0–12.0)
Neutro Abs: 2.8 10*3/uL (ref 1.4–7.7)
Neutrophils Relative %: 51.1 % (ref 43.0–77.0)
Platelets: 87 10*3/uL — ABNORMAL LOW (ref 150.0–400.0)
RBC: 3.65 Mil/uL — ABNORMAL LOW (ref 4.22–5.81)
RDW: 17.1 % — ABNORMAL HIGH (ref 11.5–15.5)
WBC: 5.4 10*3/uL (ref 4.0–10.5)

## 2013-11-03 LAB — VITAMIN B12: Vitamin B-12: 1464 pg/mL — ABNORMAL HIGH (ref 211–911)

## 2013-11-03 LAB — LIPID PANEL
Cholesterol: 242 mg/dL — ABNORMAL HIGH (ref 0–200)
HDL: 35.5 mg/dL — ABNORMAL LOW (ref >39.00)
LDL Cholesterol: 170 mg/dL — ABNORMAL HIGH (ref 0–99)
NonHDL: 206.5
Total CHOL/HDL Ratio: 7
Triglycerides: 185 mg/dL — ABNORMAL HIGH (ref 0.0–149.0)
VLDL: 37 mg/dL (ref 0.0–40.0)

## 2013-11-03 LAB — COMPREHENSIVE METABOLIC PANEL
ALT: 23 U/L (ref 0–53)
AST: 60 U/L — ABNORMAL HIGH (ref 0–37)
Albumin: 3.4 g/dL — ABNORMAL LOW (ref 3.5–5.2)
Alkaline Phosphatase: 85 U/L (ref 39–117)
BUN: 8 mg/dL (ref 6–23)
CO2: 24 mEq/L (ref 19–32)
Calcium: 9 mg/dL (ref 8.4–10.5)
Chloride: 104 mEq/L (ref 96–112)
Creatinine, Ser: 0.7 mg/dL (ref 0.4–1.5)
GFR: 137.63 mL/min (ref >60.00)
Glucose, Bld: 108 mg/dL — ABNORMAL HIGH (ref 70–99)
Potassium: 4.5 mEq/L (ref 3.5–5.1)
Sodium: 134 mEq/L — ABNORMAL LOW (ref 135–145)
Total Bilirubin: 3.4 mg/dL — ABNORMAL HIGH (ref 0.2–1.2)
Total Protein: 8.1 g/dL (ref 6.0–8.3)

## 2013-11-03 LAB — IBC PANEL
Iron: 73 ug/dL (ref 42–165)
Saturation Ratios: 26.2 % (ref 20.0–50.0)
Transferrin: 199.2 mg/dL — ABNORMAL LOW (ref 212.0–360.0)

## 2013-11-03 LAB — FOLATE: Folate: 9 ng/mL (ref >5.9)

## 2013-11-03 LAB — FERRITIN: Ferritin: 97.1 ng/mL (ref 22.0–322.0)

## 2013-11-03 MED ORDER — PROPRANOLOL HCL 40 MG PO TABS: 40.0000 mg | ORAL_TABLET | Freq: Two times a day (BID) | ORAL | Status: DC

## 2013-11-03 NOTE — Progress Notes (Signed)
Pre visit review using our clinic review tool, if applicable. No additional management support is needed unless otherwise documented below in the visit note. 

## 2013-11-03 NOTE — Patient Instructions (Signed)
Anemia, Nonspecific Anemia is a condition in which the concentration of red blood cells or hemoglobin in the blood is below normal. Hemoglobin is a substance in red blood cells that carries oxygen to the tissues of the body. Anemia results in not enough oxygen reaching these tissues.  CAUSES  Common causes of anemia include:   Excessive bleeding. Bleeding may be internal or external. This includes excessive bleeding from periods (in women) or from the intestine.   Poor nutrition.   Chronic kidney, thyroid, and liver disease.  Bone marrow disorders that decrease red blood cell production.  Cancer and treatments for cancer.  HIV, AIDS, and their treatments.  Spleen problems that increase red blood cell destruction.  Blood disorders.  Excess destruction of red blood cells due to infection, medicines, and autoimmune disorders. SIGNS AND SYMPTOMS   Minor weakness.   Dizziness.   Headache.  Palpitations.   Shortness of breath, especially with exercise.   Paleness.  Cold sensitivity.  Indigestion.  Nausea.  Difficulty sleeping.  Difficulty concentrating. Symptoms may occur suddenly or they may develop slowly.  DIAGNOSIS  Additional blood tests are often needed. These help your health care provider determine the best treatment. Your health care provider will check your stool for blood and look for other causes of blood loss.  TREATMENT  Treatment varies depending on the cause of the anemia. Treatment can include:   Supplements of iron, vitamin B12, or folic acid.   Hormone medicines.   A blood transfusion. This may be needed if blood loss is severe.   Hospitalization. This may be needed if there is significant continual blood loss.   Dietary changes.  Spleen removal. HOME CARE INSTRUCTIONS Keep all follow-up appointments. It often takes many weeks to correct anemia, and having your health care provider check on your condition and your response to  treatment is very important. SEEK IMMEDIATE MEDICAL CARE IF:   You develop extreme weakness, shortness of breath, or chest pain.   You become dizzy or have trouble concentrating.  You develop heavy vaginal bleeding.   You develop a rash.   You have bloody or black, tarry stools.   You faint.   You vomit up blood.   You vomit repeatedly.   You have abdominal pain.  You have a fever or persistent symptoms for more than 2-3 days.   You have a fever and your symptoms suddenly get worse.   You are dehydrated.  MAKE SURE YOU:  Understand these instructions.  Will watch your condition.  Will get help right away if you are not doing well or get worse. Document Released: 03/14/2004 Document Revised: 10/07/2012 Document Reviewed: 07/31/2012 Exit[employer] Patient Information 2015 [employer]. This information is not intended to replace advice given to you by your health care provider. Make sure you discuss any questions you have with your health care provider.  

## 2013-11-03 NOTE — Progress Notes (Signed)
Subjective:    Patient ID: Jacob Hill, male    DOB: 1970-04-13, 43 y.o.   MRN: [id number]  Anemia Presents for follow-up visit. Symptoms include malaise/fatigue. There has been no abdominal pain, anorexia, bruising/bleeding easily, confusion, fever, leg swelling, light-headedness, pallor, palpitations, paresthesias, pica or weight loss. Signs of blood loss that are not present include hematemesis, hematochezia and melena. Past treatments include oral iron supplements. Past medical history includes alcohol abuse, chronic liver disease and malnutrition. There are no compliance problems.       Review of Systems  Constitutional: Positive for malaise/fatigue. Negative for fever, chills, weight loss, diaphoresis, appetite change and fatigue.  HENT: Negative.   Eyes: Negative.   Respiratory: Negative.  Negative for cough, choking, chest tightness, shortness of breath and stridor.   Cardiovascular: Negative.  Negative for chest pain, palpitations and leg swelling.  Gastrointestinal: Negative.  Negative for nausea, vomiting, abdominal pain, diarrhea, constipation, blood in stool, melena, hematochezia, anorexia and hematemesis.  Endocrine: Negative.   Genitourinary: Negative.  Negative for urgency, frequency, hematuria, flank pain, decreased urine volume and difficulty urinating.  Musculoskeletal: Positive for arthralgias. Negative for back pain, gait problem, joint swelling, myalgias and neck stiffness.  Skin: Negative.  Negative for pallor and rash.  Allergic/Immunologic: Negative.   Neurological: Negative.  Negative for dizziness, syncope, speech difficulty, weakness, light-headedness, numbness, headaches and paresthesias.  Hematological: Negative.  Negative for adenopathy. Does not bruise/bleed easily.  Psychiatric/Behavioral: Positive for sleep disturbance. Negative for suicidal ideas, hallucinations, behavioral problems, confusion, self-injury, dysphoric mood, decreased concentration  and agitation. The patient is nervous/anxious. The patient is not hyperactive.        Objective:   Physical Exam  Vitals reviewed. Constitutional: He is oriented to person, place, and time. He appears cachectic. No distress.  HENT:  Mouth/Throat: Oropharynx is clear and moist. No oropharyngeal exudate.  Eyes: Conjunctivae are normal. Right eye exhibits no discharge. Left eye exhibits no discharge. No scleral icterus.  Neck: Normal range of motion. Neck supple. No JVD present. No tracheal deviation present. No thyromegaly present.  Cardiovascular: Normal rate, regular rhythm, normal heart sounds and intact distal pulses.  Exam reveals no gallop and no friction rub.   No murmur heard. Pulmonary/Chest: Effort normal and breath sounds normal. No stridor. No respiratory distress. He has no wheezes. He has no rales. He exhibits no tenderness.  Abdominal: Soft. Bowel sounds are normal. He exhibits no distension and no mass. There is hepatomegaly. There is no splenomegaly. There is no tenderness. There is no rebound and no guarding. No hernia. Hernia confirmed negative in the ventral area, confirmed negative in the right inguinal area and confirmed negative in the left inguinal area.  Musculoskeletal: Normal range of motion. He exhibits no edema and no tenderness.  Lymphadenopathy:    He has no cervical adenopathy.  Neurological: He is oriented to person, place, and time.  Skin: Skin is warm and dry. No rash noted. He is not diaphoretic. No erythema. No pallor.  Psychiatric: He has a normal mood and affect. His speech is normal and behavior is normal. Judgment and thought content normal. His mood appears not anxious. His affect is not angry. He is not slowed, not withdrawn and not actively hallucinating. Cognition and memory are normal. He does not exhibit a depressed mood. He expresses no homicidal and no suicidal ideation. He expresses no suicidal plans and no homicidal plans. He is attentive.      Lab Results  Component Value Date   WBC  5.4 09/23/2013   HGB 11.0* 09/23/2013   HCT 32.7* 09/23/2013   PLT 99.0* 09/23/2013   GLUCOSE 78 09/23/2013   CHOL 189 01/13/2012   TRIG 188.0* 01/13/2012   HDL 13.50* 01/13/2012   LDLCALC 138* 01/13/2012   ALT 32 09/23/2013   AST 110* 09/23/2013   NA 135 09/23/2013   K 4.2 09/23/2013   CL 103 09/23/2013   CREATININE 0.7 09/23/2013   BUN 10 09/23/2013   CO2 26 09/23/2013   TSH 2.59 09/23/2013   INR 1.6* 02/05/2012   HGBA1C 4.5* 03/31/2012       Assessment & Plan:

## 2013-11-04 MED ORDER — VITAMIN B-1 50 MG PO TABS: 50.0000 mg | ORAL_TABLET | Freq: Every day | ORAL | Status: DC

## 2013-11-04 NOTE — Assessment & Plan Note (Signed)
He tells me that Innopran is too expensive, will start generic propranolol

## 2013-11-04 NOTE — Assessment & Plan Note (Signed)
His BP is well controlled Will change propranolol to a generic

## 2013-11-04 NOTE — Assessment & Plan Note (Addendum)
CBC is stable - this appears to be anemia of chronic disease (liver disease and EtOH abuse) Vitamin levels are normal I have asked him to start thiamine

## 2013-11-19 ENCOUNTER — Ambulatory Visit: Payer: Self-pay | Admitting: Internal Medicine

## 2013-12-22 ENCOUNTER — Ambulatory Visit: Payer: Self-pay | Admitting: Internal Medicine

## 2014-01-05 ENCOUNTER — Encounter: Payer: Self-pay | Admitting: Internal Medicine

## 2014-01-05 ENCOUNTER — Ambulatory Visit: Payer: Self-pay | Attending: Internal Medicine | Admitting: Internal Medicine

## 2014-01-05 VITALS — BP 152/92 | HR 82 | Temp 98.5°F | Resp 16 | Ht 72.0 in | Wt 156.0 lb

## 2014-01-05 DIAGNOSIS — R569 Unspecified convulsions: Secondary | ICD-10-CM | POA: Insufficient documentation

## 2014-01-05 DIAGNOSIS — S46111A Strain of muscle, fascia and tendon of long head of biceps, right arm, initial encounter: Secondary | ICD-10-CM

## 2014-01-05 DIAGNOSIS — I10 Essential (primary) hypertension: Secondary | ICD-10-CM | POA: Insufficient documentation

## 2014-01-05 DIAGNOSIS — S46211A Strain of muscle, fascia and tendon of other parts of biceps, right arm, initial encounter: Secondary | ICD-10-CM

## 2014-01-05 DIAGNOSIS — K219 Gastro-esophageal reflux disease without esophagitis: Secondary | ICD-10-CM | POA: Insufficient documentation

## 2014-01-05 DIAGNOSIS — S46221A Laceration of muscle, fascia and tendon of other parts of biceps, right arm, initial encounter: Secondary | ICD-10-CM | POA: Insufficient documentation

## 2014-01-05 DIAGNOSIS — F1290 Cannabis use, unspecified, uncomplicated: Secondary | ICD-10-CM | POA: Insufficient documentation

## 2014-01-05 DIAGNOSIS — K7030 Alcoholic cirrhosis of liver without ascites: Secondary | ICD-10-CM | POA: Insufficient documentation

## 2014-01-05 DIAGNOSIS — Z720 Tobacco use: Secondary | ICD-10-CM

## 2014-01-05 DIAGNOSIS — F1099 Alcohol use, unspecified with unspecified alcohol-induced disorder: Secondary | ICD-10-CM | POA: Insufficient documentation

## 2014-01-05 DIAGNOSIS — F17210 Nicotine dependence, cigarettes, uncomplicated: Secondary | ICD-10-CM | POA: Insufficient documentation

## 2014-01-05 DIAGNOSIS — F419 Anxiety disorder, unspecified: Secondary | ICD-10-CM | POA: Insufficient documentation

## 2014-01-05 DIAGNOSIS — D510 Vitamin B12 deficiency anemia due to intrinsic factor deficiency: Secondary | ICD-10-CM | POA: Insufficient documentation

## 2014-01-05 DIAGNOSIS — F17200 Nicotine dependence, unspecified, uncomplicated: Secondary | ICD-10-CM

## 2014-01-05 DIAGNOSIS — G629 Polyneuropathy, unspecified: Secondary | ICD-10-CM | POA: Insufficient documentation

## 2014-01-05 DIAGNOSIS — Z79891 Long term (current) use of opiate analgesic: Secondary | ICD-10-CM | POA: Insufficient documentation

## 2014-01-05 DIAGNOSIS — J449 Chronic obstructive pulmonary disease, unspecified: Secondary | ICD-10-CM | POA: Insufficient documentation

## 2014-01-05 MED ORDER — TRAMADOL HCL 50 MG PO TABS
50.0000 mg | ORAL_TABLET | Freq: Two times a day (BID) | ORAL | Status: DC | PRN
Start: 2014-01-05 — End: 2014-05-11

## 2014-01-05 NOTE — Patient Instructions (Signed)
Smoking Cessation Quitting smoking is important to your health and has many advantages. However, it is not always easy to quit since nicotine is a very addictive drug. Oftentimes, people try 3 times or more before being able to quit. This document explains the best ways for you to prepare to quit smoking. Quitting takes hard work and a lot of effort, but you can do it. ADVANTAGES OF QUITTING SMOKING  You will live longer, feel better, and live better.  Your body will feel the impact of quitting smoking almost immediately.  Within 20 minutes, blood pressure decreases. Your pulse returns to its normal level.  After 8 hours, carbon monoxide levels in the blood return to normal. Your oxygen level increases.  After 24 hours, the chance of having a heart attack starts to decrease. Your breath, hair, and body stop smelling like smoke.  After 48 hours, damaged nerve endings begin to recover. Your sense of taste and smell improve.  After 72 hours, the body is virtually free of nicotine. Your bronchial tubes relax and breathing becomes easier.  After 2 to 12 weeks, lungs can hold more air. Exercise becomes easier and circulation improves.  The risk of having a heart attack, stroke, cancer, or lung disease is greatly reduced.  After 1 year, the risk of coronary heart disease is cut in half.  After 5 years, the risk of stroke falls to the same as a nonsmoker.  After 10 years, the risk of lung cancer is cut in half and the risk of other cancers decreases significantly.  After 15 years, the risk of coronary heart disease drops, usually to the level of a nonsmoker.  If you are pregnant, quitting smoking will improve your chances of having a healthy baby.  The people you live with, especially any children, will be healthier.  You will have extra money to spend on things other than cigarettes. QUESTIONS TO THINK ABOUT BEFORE ATTEMPTING TO QUIT You may want to talk about your answers with your  health care provider.  Why do you want to quit?  If you tried to quit in the past, what helped and what did not?  What will be the most difficult situations for you after you quit? How will you plan to handle them?  Who can help you through the tough times? Your family? Friends? A health care provider?  What pleasures do you get from smoking? What ways can you still get pleasure if you quit? Here are some questions to ask your health care provider:  How can you help me to be successful at quitting?  What medicine do you think would be best for me and how should I take it?  What should I do if I need more help?  What is smoking withdrawal like? How can I get information on withdrawal? GET READY  Set a quit date.  Change your environment by getting rid of all cigarettes, ashtrays, matches, and lighters in your home, car, or work. Do not let people smoke in your home.  Review your past attempts to quit. Think about what worked and what did not. GET SUPPORT AND ENCOURAGEMENT You have a better chance of being successful if you have help. You can get support in many ways.  Tell your family, friends, and coworkers that you are going to quit and need their support. Ask them not to smoke around you.  Get individual, group, or telephone counseling and support. Programs are available at local hospitals and health centers. Call   your local health department for information about programs in your area.  Spiritual beliefs and practices may help some smokers quit.  Download a "quit meter" on your computer to keep track of quit statistics, such as how long you have gone without smoking, cigarettes not smoked, and money saved.  Get a self-help book about quitting smoking and staying off tobacco. LEARN NEW SKILLS AND BEHAVIORS  Distract yourself from urges to smoke. Talk to someone, go for a walk, or occupy your time with a task.  Change your normal routine. Take a different route to work.  Drink tea instead of coffee. Eat breakfast in a different place.  Reduce your stress. Take a hot bath, exercise, or read a book.  Plan something enjoyable to do every day. Reward yourself for not smoking.  Explore interactive web-based programs that specialize in helping you quit. GET MEDICINE AND USE IT CORRECTLY Medicines can help you stop smoking and decrease the urge to smoke. Combining medicine with the above behavioral methods and support can greatly increase your chances of successfully quitting smoking.  Nicotine replacement therapy helps deliver nicotine to your body without the negative effects and risks of smoking. Nicotine replacement therapy includes nicotine gum, lozenges, inhalers, nasal sprays, and skin patches. Some may be available over-the-counter and others require a prescription.  Antidepressant medicine helps people abstain from smoking, but how this works is unknown. This medicine is available by prescription.  Nicotinic receptor partial agonist medicine simulates the effect of nicotine in your brain. This medicine is available by prescription. Ask your health care provider for advice about which medicines to use and how to use them based on your health history. Your health care provider will tell you what side effects to look out for if you choose to be on a medicine or therapy. Carefully read the information on the package. Do not use any other product containing nicotine while using a nicotine replacement product.  RELAPSE OR DIFFICULT SITUATIONS Most relapses occur within the first 3 months after quitting. Do not be discouraged if you start smoking again. Remember, most people try several times before finally quitting. You may have symptoms of withdrawal because your body is used to nicotine. You may crave cigarettes, be irritable, feel very hungry, cough often, get headaches, or have difficulty concentrating. The withdrawal symptoms are only temporary. They are strongest  when you first quit, but they will go away within 10-14 days. To reduce the chances of relapse, try to:  Avoid drinking alcohol. Drinking lowers your chances of successfully quitting.  Reduce the amount of caffeine you consume. Once you quit smoking, the amount of caffeine in your body increases and can give you symptoms, such as a rapid heartbeat, sweating, and anxiety.  Avoid smokers because they can make you want to smoke.  Do not let weight gain distract you. Many smokers will gain weight when they quit, usually less than 10 pounds. Eat a healthy diet and stay active. You can always lose the weight gained after you quit.  Find ways to improve your mood other than smoking. FOR MORE INFORMATION  www.smokefree.gov  Document Released: 01/29/2001 Document Revised: 06/21/2013 Document Reviewed: 05/16/2011 [employer] Patient Information 2015 [employer]. This information is not intended to replace advice given to you by your health care provider. Make sure you discuss any questions you have with your health care provider.  

## 2014-01-05 NOTE — Progress Notes (Signed)
Patient ID: Jacob ShadMichael G Gornick, male   DOB: 01/07/1971, 43 y.o.   MRN: [id number]  CC: Establish care  HPI: Jacob Hill is a 43 y.o. male with a past medical history of COPD, GERD, seizures, hypertension, arthritis. He is only taking valium for seizures/anxiety.  He reports that he has peripheral neuropathy in bilateral feet with bone spurs and has been having severe pain.  He had a xray in July. Been going to [facility] for primary care.  He reports that he has to see a specialist in order to prove he has foot problems to get disability.  He reports that he only drinks alcohol once a month for the past year.  He has a history of alcohol abuse in the past but cut back on alcohol since diagnosed with cirrhosis.  He is in a Methadone clinic for pain and heroin withdrawl.  He takes propanolol only once per day, has missed several days. He c/o of a right bicep dethachment that he has had for past 10 years but has not had insurance to get it fixed.     Allergies  Allergen Reactions  . Codeine   . Nsaids     REACTION: GERD   Past Medical History  Diagnosis Date  . COPD (chronic obstructive pulmonary disease)   . GERD (gastroesophageal reflux disease)   . Osteoarthritis   . Seizure disorder   . Seizures   . Neuropathy   . Hypertension   . Cirrhosis   . DJD (degenerative joint disease)    Current Outpatient Prescriptions on File Prior to Visit  Medication Sig Dispense Refill  . diazepam (VALIUM) 10 MG tablet Take 0.5 tablets (5 mg total) by mouth every 8 (eight) hours as needed for anxiety. 60 tablet 4  . Linaclotide (LINZESS) 145 MCG CAPS Take 1 capsule (145 mcg total) by mouth daily. 84 capsule 0  . METHADONE HCL PO Take 7 mg by mouth daily.    . potassium chloride SA (KLOR-CON M20) 20 MEQ tablet take 1 tablet by mouth twice a day 60 tablet 5  . promethazine (PHENERGAN) 12.5 MG tablet Take 1 tablet (12.5 mg total) by mouth every 6 (six) hours as needed for nausea or vomiting. 50 tablet 3   . propranolol (INDERAL) 40 MG tablet Take 1 tablet (40 mg total) by mouth 2 (two) times daily. 60 tablet 11  . thiamine (VITAMIN B-1) 50 MG tablet Take 1 tablet (50 mg total) by mouth daily. 90 tablet 1   No current facility-administered medications on file prior to visit.   Family History  Problem Relation Age of Onset  . Diabetes Other   . Hypertension Other   . Diabetes Father    History   Social History  . Marital Status: Married    Spouse Name: N/A    Number of Children: N/A  . Years of Education: N/A   Occupational History  . Not on file.   Social History Main Topics  . Smoking status: Current Every Day Smoker -- 2.00 packs/day for 15 years    Types: Cigarettes  . Smokeless tobacco: Never Used  . Alcohol Use: 6.0 oz/week    10 Shots of liquor per week     Comment: occasional   . Drug Use: Yes    Special: Marijuana  . Sexual Activity: Not Currently   Other Topics Concern  . Not on file   Social History Narrative    Review of Systems  Respiratory: Positive for cough. Negative for  shortness of breath and wheezing.   Musculoskeletal:       Bilateral foot pain  Neurological: Positive for seizures.  Psychiatric/Behavioral: The patient is nervous/anxious.   All other systems reviewed and are negative.  .    Objective:   Filed Vitals:   01/05/14 1105  BP: 152/92  Pulse: 82  Temp: 98.5 F (36.9 C)  Resp: 16    Physical Exam: Constitutional: Patient appears well-developed and well-nourished. No distress. HENT: Normocephalic, atraumatic, External right and left ear normal. Oropharynx is clear and moist.  Eyes: Conjunctivae and EOM are normal. PERRLA, no scleral icterus. Neck: Normal ROM. Neck supple. No JVD. No tracheal deviation. No thyromegaly. CVS: RRR, S1/S2 +, no murmurs, no gallops, no carotid bruit.  Pulmonary: Effort and breath sounds normal, no stridor, rhonchi, wheezes, rales.  Abdominal: Soft. BS +,  no distension, tenderness, rebound or  guarding.  Musculoskeletal: Normal range of motion. No edema and no tenderness. Detached bicep muscle that is freely moveable. Severely sensitive and tender to touch feet.  Unable to fully flex toes of BLE Lymphadenopathy: No lymphadenopathy noted, cervical, Neuro: Alert. Normal reflexes, muscle tone coordination. No cranial nerve deficit. Skin: Skin is warm and dry. No rash noted. Not diaphoretic. No erythema. No pallor. Psychiatric: Normal mood and affect. Behavior, judgment, thought content normal.  Lab Results  Component Value Date   WBC 5.4 11/03/2013   HGB 12.5* 11/03/2013   HCT 37.4* 11/03/2013   MCV 102.3* 11/03/2013   PLT 87.0* 11/03/2013   Lab Results  Component Value Date   CREATININE 0.7 11/03/2013   BUN 8 11/03/2013   NA 134* 11/03/2013   K 4.5 11/03/2013   CL 104 11/03/2013   CO2 24 11/03/2013    Lab Results  Component Value Date   HGBA1C 4.5* 03/31/2012   Lipid Panel     Component Value Date/Time   CHOL 242* 11/03/2013 1043   TRIG 185.0* 11/03/2013 1043   HDL 35.50* 11/03/2013 1043   CHOLHDL 7 11/03/2013 1043   VLDL 37.0 11/03/2013 1043   LDLCALC 170* 11/03/2013 1043       Assessment and plan:  Casimiro NeedleMichael was seen today for establish care.  Diagnoses and associated orders for this visit:  Essential hypertension, benign Continue on propanolol but need to take twice per day.  Stressed correctly taking medication for maximal benefit  Pernicious anemia Advised patient to continue with thiamine vitamin and to stop drinking alcohol  Alcoholic cirrhosis of liver without ascites Advised patient to stop drinking alcohol, risk of esophageal varices especially with not properly taking propranolol  Peripheral neuropathy Stop drinking, continue thiamine. Will not begin gabapentin due to patient currently weaning off methadone - traMADol (ULTRAM) 50 MG tablet; Take 1 tablet (50 mg total) by mouth every 12 (twelve) hours as needed.  Smoker Smoking cessation  discussed for 3 minutes, patient is not willing to quit at this time. Will continue to assess on each visit. Discussed increased risk for diseases such as cancer, heart disease, and stroke.   Biceps muscle tear, right, initial encounter Since Muscle has been that way for so long and is not currently bothering him will defer treatment     Return in about 3 months (around 04/07/2014) for Hypertension.    Will call back once he gets hospital discount to get referral for podiatry.      Holland CommonsKECK, VALERIE, NP-C [facility] [phone] 01/05/2014, 11:29 AM

## 2014-01-05 NOTE — Progress Notes (Signed)
Pt is here to establish care. Pt reports having chronic pain in his b/l feet. Pt is requesting a referral to a foot doctor and an eye doctor. Pt's right bicep is detached and is freely moving with some pain but has been that way for 10 years.

## 2014-02-03 ENCOUNTER — Ambulatory Visit: Payer: Self-pay | Admitting: Internal Medicine

## 2014-02-14 ENCOUNTER — Ambulatory Visit: Payer: Self-pay | Admitting: Internal Medicine

## 2014-02-14 ENCOUNTER — Telehealth: Payer: Self-pay | Admitting: Internal Medicine

## 2014-02-14 NOTE — Telephone Encounter (Signed)
Pt No-Showed for his 67mo f/u visit today.  Please advise.

## 2014-04-21 ENCOUNTER — Ambulatory Visit: Payer: Self-pay | Admitting: Internal Medicine

## 2014-05-11 ENCOUNTER — Encounter: Payer: Self-pay | Admitting: Internal Medicine

## 2014-05-11 ENCOUNTER — Ambulatory Visit (INDEPENDENT_AMBULATORY_CARE_PROVIDER_SITE_OTHER): Payer: Self-pay | Admitting: Internal Medicine

## 2014-05-11 VITALS — BP 154/82 | HR 81 | Temp 98.7°F | Resp 16 | Ht 72.0 in | Wt 162.0 lb

## 2014-05-11 DIAGNOSIS — D510 Vitamin B12 deficiency anemia due to intrinsic factor deficiency: Secondary | ICD-10-CM

## 2014-05-11 DIAGNOSIS — G43A0 Cyclical vomiting, not intractable: Secondary | ICD-10-CM

## 2014-05-11 DIAGNOSIS — F411 Generalized anxiety disorder: Secondary | ICD-10-CM

## 2014-05-11 DIAGNOSIS — K7031 Alcoholic cirrhosis of liver with ascites: Secondary | ICD-10-CM

## 2014-05-11 DIAGNOSIS — R569 Unspecified convulsions: Secondary | ICD-10-CM

## 2014-05-11 DIAGNOSIS — K7030 Alcoholic cirrhosis of liver without ascites: Secondary | ICD-10-CM

## 2014-05-11 DIAGNOSIS — I10 Essential (primary) hypertension: Secondary | ICD-10-CM

## 2014-05-11 DIAGNOSIS — R1115 Cyclical vomiting syndrome unrelated to migraine: Secondary | ICD-10-CM

## 2014-05-11 MED ORDER — DIAZEPAM 10 MG PO TABS: 5.0000 mg | ORAL_TABLET | Freq: Three times a day (TID) | ORAL | Status: DC | PRN

## 2014-05-11 MED ORDER — PROMETHAZINE HCL 12.5 MG PO TABS: 12.5000 mg | ORAL_TABLET | Freq: Four times a day (QID) | ORAL | Status: DC | PRN

## 2014-05-11 NOTE — Assessment & Plan Note (Signed)
Repeat CBC today 

## 2014-05-11 NOTE — Assessment & Plan Note (Signed)
He will not take an SSRI Will cont valium as needed

## 2014-05-11 NOTE — Assessment & Plan Note (Signed)
Will restart phenergan as needed

## 2014-05-11 NOTE — Progress Notes (Signed)
Subjective:    Patient ID: Jacob Hill, male    DOB: 06-23-1970, 44 y.o.   MRN: [id number]  HPI Comments: He returns for f/up and complains of occasional nausea with rare vomiting, he ran out of phenergan a few days ago and has had worsening symptoms since then. He does not report any hematemesis. He has had severe anxiety, panic, shaking and needs a refill on valium.  Hypertension Pertinent negatives include no chest pain, headaches, neck pain, palpitations or shortness of breath.      Review of Systems  Constitutional: Positive for fatigue. Negative for fever, chills, diaphoresis, activity change, appetite change and unexpected weight change.  HENT: Negative.   Eyes: Negative.   Respiratory: Negative.  Negative for cough, choking, chest tightness, shortness of breath and stridor.   Cardiovascular: Negative.  Negative for chest pain, palpitations and leg swelling.  Gastrointestinal: Positive for nausea and vomiting. Negative for abdominal pain, diarrhea, constipation, blood in stool, abdominal distention, anal bleeding and rectal pain.  Endocrine: Negative.   Genitourinary: Negative.  Negative for urgency, frequency, hematuria, flank pain, decreased urine volume and difficulty urinating.  Musculoskeletal: Positive for arthralgias (both feet). Negative for myalgias, back pain, joint swelling, gait problem, neck pain and neck stiffness.  Skin: Negative.   Allergic/Immunologic: Negative.   Neurological: Negative.  Negative for dizziness, syncope, speech difficulty, light-headedness, numbness and headaches.  Hematological: Negative.  Negative for adenopathy. Does not bruise/bleed easily.  Psychiatric/Behavioral: Positive for sleep disturbance. Negative for suicidal ideas, hallucinations, behavioral problems, confusion, self-injury, dysphoric mood, decreased concentration and agitation. The patient is nervous/anxious. The patient is not hyperactive.        Objective:   Physical  Exam  Constitutional: He is oriented to person, place, and time. He appears well-developed and well-nourished.  Non-toxic appearance. He has a sickly appearance. He does not appear ill. No distress.  HENT:  Head: Normocephalic.  Mouth/Throat: No oropharyngeal exudate.  Eyes: Conjunctivae are normal. Right eye exhibits no discharge. Left eye exhibits no discharge. Scleral icterus is present.  Neck: Normal range of motion. Neck supple. No JVD present. No tracheal deviation present. No thyromegaly present.  Cardiovascular: Normal rate, regular rhythm, normal heart sounds and intact distal pulses.  Exam reveals no gallop and no friction rub.   No murmur heard. Pulmonary/Chest: Effort normal and breath sounds normal. No stridor. No respiratory distress. He has no wheezes. He has no rales. He exhibits no tenderness.  Abdominal: Soft. Bowel sounds are normal. He exhibits no distension and no mass. There is no tenderness. There is no rebound and no guarding.  Musculoskeletal: Normal range of motion. He exhibits no edema or tenderness.  Lymphadenopathy:    He has no cervical adenopathy.  Neurological: He is oriented to person, place, and time.  Skin: Skin is warm and dry. No rash noted. He is not diaphoretic. No erythema. No pallor.  There are spider angiomas over his face and chest  Psychiatric: His speech is normal and behavior is normal. Judgment and thought content normal. His mood appears anxious. His affect is not angry, not blunt, not labile and not inappropriate. Cognition and memory are normal. He does not exhibit a depressed mood.      Lab Results  Component Value Date   WBC 5.4 11/03/2013   HGB 12.5* 11/03/2013   HCT 37.4* 11/03/2013   PLT 87.0* 11/03/2013   GLUCOSE 108* 11/03/2013   CHOL 242* 11/03/2013   TRIG 185.0* 11/03/2013   HDL 35.50* 11/03/2013  LDLCALC 170* 11/03/2013   ALT 23 11/03/2013   AST 60* 11/03/2013   NA 134* 11/03/2013   K 4.5 11/03/2013   CL 104 11/03/2013     CREATININE 0.7 11/03/2013   BUN 8 11/03/2013   CO2 24 11/03/2013   TSH 2.59 09/23/2013   INR 1.6* 02/05/2012   HGBA1C 4.5* 03/31/2012      Assessment & Plan:

## 2014-05-11 NOTE — Patient Instructions (Signed)

## 2014-05-11 NOTE — Progress Notes (Signed)
Pre visit review using our clinic review tool, if applicable. No additional management support is needed unless otherwise documented below in the visit note. 

## 2014-05-11 NOTE — Assessment & Plan Note (Addendum)
Will recheck his labs today to monitor his blood counts and electrolytes He has not had any alcohol for one month

## 2014-05-11 NOTE — Assessment & Plan Note (Signed)
His BP is nor adequately well controlled He agrees to be more compliant with the propanolol therapy

## 2014-05-12 ENCOUNTER — Telehealth: Payer: Self-pay | Admitting: Internal Medicine

## 2014-05-12 NOTE — Telephone Encounter (Signed)
emmi mailed  °

## 2014-06-09 ENCOUNTER — Telehealth: Payer: Self-pay | Admitting: Internal Medicine

## 2014-06-09 NOTE — Telephone Encounter (Signed)
He is faxing a coordination of care and needs to know medication that has been prescribed and diagnosis. He has tried to fax this before about a month ago.

## 2014-07-04 NOTE — Telephone Encounter (Signed)
[facility]. I printed it from [employer] and [facility]

## 2014-09-07 ENCOUNTER — Ambulatory Visit: Payer: Self-pay | Admitting: Internal Medicine

## 2014-09-13 ENCOUNTER — Other Ambulatory Visit (INDEPENDENT_AMBULATORY_CARE_PROVIDER_SITE_OTHER): Payer: Self-pay

## 2014-09-13 ENCOUNTER — Ambulatory Visit (INDEPENDENT_AMBULATORY_CARE_PROVIDER_SITE_OTHER): Payer: Self-pay | Admitting: Internal Medicine

## 2014-09-13 ENCOUNTER — Other Ambulatory Visit: Payer: Self-pay | Admitting: Internal Medicine

## 2014-09-13 ENCOUNTER — Encounter: Payer: Self-pay | Admitting: Internal Medicine

## 2014-09-13 VITALS — BP 148/92 | HR 124 | Temp 99.1°F | Ht 72.0 in | Wt 159.0 lb

## 2014-09-13 DIAGNOSIS — K219 Gastro-esophageal reflux disease without esophagitis: Secondary | ICD-10-CM

## 2014-09-13 DIAGNOSIS — F411 Generalized anxiety disorder: Secondary | ICD-10-CM

## 2014-09-13 DIAGNOSIS — I10 Essential (primary) hypertension: Secondary | ICD-10-CM

## 2014-09-13 DIAGNOSIS — K7030 Alcoholic cirrhosis of liver without ascites: Secondary | ICD-10-CM

## 2014-09-13 DIAGNOSIS — D510 Vitamin B12 deficiency anemia due to intrinsic factor deficiency: Secondary | ICD-10-CM

## 2014-09-13 DIAGNOSIS — R569 Unspecified convulsions: Secondary | ICD-10-CM

## 2014-09-13 DIAGNOSIS — D529 Folate deficiency anemia, unspecified: Secondary | ICD-10-CM

## 2014-09-13 LAB — CBC WITH DIFFERENTIAL/PLATELET
Basophils Absolute: 0 10*3/uL (ref 0.0–0.1)
Basophils Relative: 0.2 % (ref 0.0–3.0)
Eosinophils Absolute: 0.1 10*3/uL (ref 0.0–0.7)
Eosinophils Relative: 1.6 % (ref 0.0–5.0)
HCT: 33.8 % — ABNORMAL LOW (ref 39.0–52.0)
Hemoglobin: 11.5 g/dL — ABNORMAL LOW (ref 13.0–17.0)
Lymphocytes Relative: 19.7 % (ref 12.0–46.0)
Lymphs Abs: 0.8 10*3/uL (ref 0.7–4.0)
MCHC: 34 g/dL (ref 30.0–36.0)
MCV: 99.8 fl (ref 78.0–100.0)
Monocytes Absolute: 0.5 10*3/uL (ref 0.1–1.0)
Monocytes Relative: 12 % (ref 3.0–12.0)
Neutro Abs: 2.8 10*3/uL (ref 1.4–7.7)
Neutrophils Relative %: 66.5 % (ref 43.0–77.0)
Platelets: 61 10*3/uL — ABNORMAL LOW (ref 150.0–400.0)
RBC: 3.38 Mil/uL — ABNORMAL LOW (ref 4.22–5.81)
RDW: 15.6 % — ABNORMAL HIGH (ref 11.5–15.5)
WBC: 4.2 10*3/uL (ref 4.0–10.5)

## 2014-09-13 LAB — IBC PANEL
Iron: 103 ug/dL (ref 42–165)
Saturation Ratios: 41.6 % (ref 20.0–50.0)
Transferrin: 177 mg/dL — ABNORMAL LOW (ref 212.0–360.0)

## 2014-09-13 LAB — PROTIME-INR
INR: 1.5 ratio — ABNORMAL HIGH (ref 0.8–1.0)
Prothrombin Time: 16.8 s — ABNORMAL HIGH (ref 9.6–13.1)

## 2014-09-13 LAB — COMPREHENSIVE METABOLIC PANEL
ALT: 23 U/L (ref 0–53)
AST: 74 U/L — ABNORMAL HIGH (ref 0–37)
Albumin: 3.1 g/dL — ABNORMAL LOW (ref 3.5–5.2)
Alkaline Phosphatase: 88 U/L (ref 39–117)
BUN: 8 mg/dL (ref 6–23)
CO2: 26 mEq/L (ref 19–32)
Calcium: 9.1 mg/dL (ref 8.4–10.5)
Chloride: 104 mEq/L (ref 96–112)
Creatinine, Ser: 0.6 mg/dL (ref 0.40–1.50)
GFR: 155.7 mL/min (ref >60.00)
Glucose, Bld: 95 mg/dL (ref 70–99)
Potassium: 3.8 mEq/L (ref 3.5–5.1)
Sodium: 139 mEq/L (ref 135–145)
Total Bilirubin: 3.3 mg/dL — ABNORMAL HIGH (ref 0.2–1.2)
Total Protein: 7.3 g/dL (ref 6.0–8.3)

## 2014-09-13 LAB — TSH: TSH: 1.42 u[IU]/mL (ref 0.35–4.50)

## 2014-09-13 LAB — VITAMIN B12: Vitamin B-12: >1500 pg/mL — ABNORMAL HIGH (ref 211–911)

## 2014-09-13 LAB — FOLATE: Folate: 4.4 ng/mL — ABNORMAL LOW (ref >5.9)

## 2014-09-13 LAB — FERRITIN: Ferritin: 123.2 ng/mL (ref 22.0–322.0)

## 2014-09-13 MED ORDER — PROPRANOLOL HCL 40 MG PO TABS: 40.0000 mg | ORAL_TABLET | Freq: Two times a day (BID) | ORAL | Status: DC

## 2014-09-13 MED ORDER — FOLIC ACID 1 MG PO TABS: 1.0000 mg | ORAL_TABLET | Freq: Every day | ORAL | Status: DC

## 2014-09-13 MED ORDER — DIAZEPAM 10 MG PO TABS: 5.0000 mg | ORAL_TABLET | Freq: Three times a day (TID) | ORAL | Status: DC | PRN

## 2014-09-13 MED ORDER — MIRTAZAPINE 7.5 MG PO TABS: 7.5000 mg | ORAL_TABLET | Freq: Every day | ORAL | Status: DC

## 2014-09-13 NOTE — Progress Notes (Signed)
Pre visit review using our clinic review tool, if applicable. No additional management support is needed unless otherwise documented below in the visit note. 

## 2014-09-13 NOTE — Patient Instructions (Signed)

## 2014-09-13 NOTE — Progress Notes (Signed)
Subjective:  Patient ID: Jacob Hill, male    DOB: 21-Dec-1970  Age: 44 y.o. MRN: [id number]  CC: Hypertension   HPI ATHA MCBAIN presents for a blood pressure check but he also complains of worsening anxiety, anhedonia and insomnia. He is going through an estate battle with one of his aunts and is causing him undue stress. He requests an increase in the dose of Valium from 2 times a day to up to 3 times a day. He denies any suicidal or homicidal ideations. He takes the propranolol about 50% of the time.  Outpatient Prescriptions Prior to Visit  Medication Sig Dispense Refill  . METHADONE HCL PO Take 25 mg by mouth daily.     . potassium chloride SA (KLOR-CON M20) 20 MEQ tablet take 1 tablet by mouth twice a day 60 tablet 5  . promethazine (PHENERGAN) 12.5 MG tablet Take 1 tablet (12.5 mg total) by mouth every 6 (six) hours as needed for nausea or vomiting. 50 tablet 3  . thiamine (VITAMIN B-1) 50 MG tablet Take 1 tablet (50 mg total) by mouth daily. 90 tablet 1  . diazepam (VALIUM) 10 MG tablet Take 0.5 tablets (5 mg total) by mouth every 8 (eight) hours as needed for anxiety. 60 tablet 4  . propranolol (INDERAL) 40 MG tablet Take 1 tablet (40 mg total) by mouth 2 (two) times daily. 60 tablet 11   No facility-administered medications prior to visit.    ROS Review of Systems  Constitutional: Positive for fatigue. Negative for fever, chills, diaphoresis, appetite change and unexpected weight change.  HENT: Negative.   Eyes: Negative.   Respiratory: Negative.  Negative for cough, choking, chest tightness, shortness of breath and stridor.   Cardiovascular: Negative.  Negative for chest pain, palpitations and leg swelling.  Gastrointestinal: Negative.  Negative for nausea, vomiting, abdominal pain, diarrhea, constipation and blood in stool.  Endocrine: Negative.   Genitourinary: Negative.  Negative for difficulty urinating.  Musculoskeletal: Positive for arthralgias  (bilateral foot pain). Negative for myalgias, back pain and joint swelling.  Skin: Negative.  Negative for rash.  Allergic/Immunologic: Negative.   Neurological: Negative.  Negative for dizziness, tremors, weakness, light-headedness and numbness.  Hematological: Negative.  Negative for adenopathy. Does not bruise/bleed easily.  Psychiatric/Behavioral: Positive for sleep disturbance and dysphoric mood. Negative for suicidal ideas, hallucinations, behavioral problems, confusion, self-injury, decreased concentration and agitation. The patient is nervous/anxious. The patient is not hyperactive.     Objective:  BP 148/92 mmHg  Pulse 124  Temp(Src) 99.1 F (37.3 C) (Oral)  Ht 6' (1.829 m)  Wt 159 lb (72.122 kg)  BMI 21.56 kg/m2  SpO2 96%  BP Readings from Last 3 Encounters:  09/13/14 148/92  05/11/14 154/82  01/05/14 152/92    Wt Readings from Last 3 Encounters:  09/13/14 159 lb (72.122 kg)  05/11/14 162 lb (73.483 kg)  01/05/14 156 lb (70.761 kg)    Physical Exam  Constitutional: He is oriented to person, place, and time.  Non-toxic appearance. He does not have a sickly appearance. He does not appear ill. No distress.  HENT:  Mouth/Throat: Oropharynx is clear and moist. No oropharyngeal exudate.  Eyes: Conjunctivae are normal. Right eye exhibits no discharge. Left eye exhibits no discharge. No scleral icterus.  Neck: Normal range of motion. Neck supple. No JVD present. No tracheal deviation present. No thyromegaly present.  Cardiovascular: Normal rate, regular rhythm, normal heart sounds and intact distal pulses.  Exam reveals no gallop and no friction  rub.   No murmur heard. Pulmonary/Chest: Effort normal and breath sounds normal. No stridor. No respiratory distress. He has no wheezes. He has no rales. He exhibits no tenderness.  Abdominal: Soft. Bowel sounds are normal. He exhibits no distension and no mass. There is no tenderness. There is no rebound and no guarding.    Musculoskeletal: Normal range of motion. He exhibits no edema or tenderness.  Lymphadenopathy:    He has no cervical adenopathy.  Neurological: He is oriented to person, place, and time.  Skin: Skin is warm and dry. No rash noted. He is not diaphoretic. No erythema. No pallor.    Lab Results  Component Value Date   WBC 5.4 11/03/2013   HGB 12.5* 11/03/2013   HCT 37.4* 11/03/2013   PLT 87.0* 11/03/2013   GLUCOSE 108* 11/03/2013   CHOL 242* 11/03/2013   TRIG 185.0* 11/03/2013   HDL 35.50* 11/03/2013   LDLCALC 170* 11/03/2013   ALT 23 11/03/2013   AST 60* 11/03/2013   NA 134* 11/03/2013   K 4.5 11/03/2013   CL 104 11/03/2013   CREATININE 0.7 11/03/2013   BUN 8 11/03/2013   CO2 24 11/03/2013   TSH 2.59 09/23/2013   INR 1.6* 02/05/2012   HGBA1C 4.5* 03/31/2012    No results found.  Assessment & Plan:   Artavious was seen today for hypertension.  Diagnoses and all orders for this visit:  Alcoholic cirrhosis of liver without ascites- again, I have referred him to gastroenterology to consider upper endoscopy to be screened for varices. Orders: -     Ambulatory referral to Gastroenterology -     Comprehensive metabolic panel; Future -     CBC with Differential/Platelet; Future -     Protime-INR; Future -     propranolol (INDERAL) 40 MG tablet; Take 1 tablet (40 mg total) by mouth 2 (two) times daily.  Essential hypertension, benign- his blood pressure and pulse are not well controlled, I have asked him to be more compliant with the propranolol therapy. Orders: -     Comprehensive metabolic panel; Future -     CBC with Differential/Platelet; Future -     TSH; Future -     propranolol (INDERAL) 40 MG tablet; Take 1 tablet (40 mg total) by mouth 2 (two) times daily.  Pernicious anemia- will recheck his hemoglobin and hematocrit and will also check his vitamin levels. Orders: -     CBC with Differential/Platelet; Future -     Vitamin B12; Future -     Vitamin B6;  Future -     Vitamin B1; Future -     Folate; Future -     Ferritin; Future -     IBC panel; Future  Gastroesophageal reflux disease without esophagitis Orders: -     CBC with Differential/Platelet; Future  GAD (generalized anxiety disorder)- I have asked him to start Remeron and will increase the Valium to up to 3 times a day as needed. Orders: -     mirtazapine (REMERON) 7.5 MG tablet; Take 1 tablet (7.5 mg total) by mouth at bedtime. -     diazepam (VALIUM) 10 MG tablet; Take 0.5 tablets (5 mg total) by mouth every 8 (eight) hours as needed for anxiety.  Convulsions, unspecified convulsion type- he has not recently had any seizures. Orders: -     diazepam (VALIUM) 10 MG tablet; Take 0.5 tablets (5 mg total) by mouth every 8 (eight) hours as needed for anxiety.   I am  having Mr. Saraceni start on mirtazapine. I am also having him maintain his potassium chloride SA, METHADONE HCL PO, thiamine, promethazine, diazepam, and propranolol.  Meds ordered this encounter  Medications  . mirtazapine (REMERON) 7.5 MG tablet    Sig: Take 1 tablet (7.5 mg total) by mouth at bedtime.    Dispense:  30 tablet    Refill:  2  . diazepam (VALIUM) 10 MG tablet    Sig: Take 0.5 tablets (5 mg total) by mouth every 8 (eight) hours as needed for anxiety.    Dispense:  75 tablet    Refill:  3  . propranolol (INDERAL) 40 MG tablet    Sig: Take 1 tablet (40 mg total) by mouth 2 (two) times daily.    Dispense:  60 tablet    Refill:  11     Follow-up: Return in about 2 months (around 11/14/2014).  Sanda Linger, MD

## 2014-09-14 ENCOUNTER — Telehealth: Payer: Self-pay

## 2014-09-14 DIAGNOSIS — D510 Vitamin B12 deficiency anemia due to intrinsic factor deficiency: Secondary | ICD-10-CM

## 2014-09-14 NOTE — Telephone Encounter (Signed)
Lab called and needed the B labs reentered to state future.

## 2014-09-15 ENCOUNTER — Telehealth: Payer: Self-pay | Admitting: Internal Medicine

## 2014-09-15 NOTE — Telephone Encounter (Signed)
Patient was prescribed mirtazapine yesterday and its pretty expensive and he has no insurance. Is there something else that he can take that will be cheaper

## 2014-09-15 NOTE — Telephone Encounter (Signed)
There isn't an alternative that will do the same thing that mirtazapine will do, unfortunately.

## 2014-09-16 ENCOUNTER — Other Ambulatory Visit: Payer: Self-pay

## 2014-09-16 DIAGNOSIS — F411 Generalized anxiety disorder: Secondary | ICD-10-CM

## 2014-09-16 MED ORDER — MIRTAZAPINE 7.5 MG PO TABS: 7.5000 mg | ORAL_TABLET | Freq: Every day | ORAL | Status: DC

## 2014-09-16 NOTE — Telephone Encounter (Signed)
LMOVM informing patient. 

## 2014-09-16 NOTE — Telephone Encounter (Signed)
Patient assistant application for mirtazapine printed, and will mail it out on Monday after obtaining signature.

## 2014-09-19 ENCOUNTER — Encounter: Payer: Self-pay | Admitting: Internal Medicine

## 2014-12-02 ENCOUNTER — Other Ambulatory Visit: Payer: Self-pay

## 2014-12-02 DIAGNOSIS — R1115 Cyclical vomiting syndrome unrelated to migraine: Secondary | ICD-10-CM

## 2014-12-02 DIAGNOSIS — K7030 Alcoholic cirrhosis of liver without ascites: Secondary | ICD-10-CM

## 2014-12-02 DIAGNOSIS — K7031 Alcoholic cirrhosis of liver with ascites: Secondary | ICD-10-CM

## 2014-12-02 NOTE — Telephone Encounter (Signed)
Incoming fax for pended Rf.

## 2014-12-04 MED ORDER — PROMETHAZINE HCL 12.5 MG PO TABS: 12.5000 mg | ORAL_TABLET | Freq: Four times a day (QID) | ORAL | Status: DC | PRN

## 2015-02-08 ENCOUNTER — Telehealth: Payer: Self-pay | Admitting: *Deleted

## 2015-02-08 DIAGNOSIS — K7030 Alcoholic cirrhosis of liver without ascites: Secondary | ICD-10-CM

## 2015-02-08 DIAGNOSIS — I10 Essential (primary) hypertension: Secondary | ICD-10-CM

## 2015-02-08 MED ORDER — PROPRANOLOL HCL 40 MG PO TABS: 40.0000 mg | ORAL_TABLET | Freq: Two times a day (BID) | ORAL | Status: DC

## 2015-02-08 NOTE — Telephone Encounter (Signed)
Receive call pt states his pharmacy will not refill his Propanolol. Inform pt he has a year refills on med. MD sent rx for #60 w/11 refills on 09/13/14. Inform him will contact pharmacy to see what's wrong[facility]pan[facility]ol. She stated that it was receive but then someone deleted it. Will need another script sent. Inform sending electronically as we speak...Raechel Chute/LMB

## 2015-02-28 ENCOUNTER — Ambulatory Visit: Payer: Self-pay | Admitting: Internal Medicine

## 2015-04-05 ENCOUNTER — Ambulatory Visit: Payer: Self-pay | Admitting: Internal Medicine

## 2015-11-01 ENCOUNTER — Encounter (HOSPITAL_COMMUNITY): Payer: Self-pay | Admitting: Emergency Medicine

## 2015-11-01 ENCOUNTER — Emergency Department (HOSPITAL_COMMUNITY)
Admission: EM | Admit: 2015-11-01 | Discharge: 2015-11-01 | Disposition: A | Discharge status: Home / Self Care | Payer: Self-pay | Attending: Emergency Medicine | Admitting: Emergency Medicine

## 2015-11-01 ENCOUNTER — Emergency Department (HOSPITAL_COMMUNITY): Payer: Self-pay

## 2015-11-01 DIAGNOSIS — R1011 Right upper quadrant pain: Secondary | ICD-10-CM | POA: Insufficient documentation

## 2015-11-01 DIAGNOSIS — Z79899 Other long term (current) drug therapy: Secondary | ICD-10-CM | POA: Insufficient documentation

## 2015-11-01 DIAGNOSIS — R748 Abnormal levels of other serum enzymes: Secondary | ICD-10-CM | POA: Insufficient documentation

## 2015-11-01 DIAGNOSIS — J449 Chronic obstructive pulmonary disease, unspecified: Secondary | ICD-10-CM | POA: Insufficient documentation

## 2015-11-01 DIAGNOSIS — E876 Hypokalemia: Secondary | ICD-10-CM | POA: Insufficient documentation

## 2015-11-01 DIAGNOSIS — K7030 Alcoholic cirrhosis of liver without ascites: Secondary | ICD-10-CM | POA: Insufficient documentation

## 2015-11-01 DIAGNOSIS — F17210 Nicotine dependence, cigarettes, uncomplicated: Secondary | ICD-10-CM | POA: Insufficient documentation

## 2015-11-01 DIAGNOSIS — K7031 Alcoholic cirrhosis of liver with ascites: Secondary | ICD-10-CM

## 2015-11-01 DIAGNOSIS — R1115 Cyclical vomiting syndrome unrelated to migraine: Secondary | ICD-10-CM

## 2015-11-01 DIAGNOSIS — I10 Essential (primary) hypertension: Secondary | ICD-10-CM | POA: Insufficient documentation

## 2015-11-01 LAB — COMPREHENSIVE METABOLIC PANEL
ALT: 27 U/L (ref 17–63)
AST: 56 U/L — ABNORMAL HIGH (ref 15–41)
Albumin: 3 g/dL — ABNORMAL LOW (ref 3.5–5.0)
Alkaline Phosphatase: 63 U/L (ref 38–126)
Anion gap: 9 (ref 5–15)
BUN: <5 mg/dL — ABNORMAL LOW (ref 6–20)
CO2: 24 mmol/L (ref 22–32)
Calcium: 8.7 mg/dL — ABNORMAL LOW (ref 8.9–10.3)
Chloride: 106 mmol/L (ref 101–111)
Creatinine, Ser: 0.87 mg/dL (ref 0.61–1.24)
GFR calc Af Amer: >60 mL/min (ref >60)
GFR calc non Af Amer: >60 mL/min (ref >60)
Glucose, Bld: 91 mg/dL (ref 65–99)
Potassium: 2.9 mmol/L — ABNORMAL LOW (ref 3.5–5.1)
Sodium: 139 mmol/L (ref 135–145)
Total Bilirubin: 2.5 mg/dL — ABNORMAL HIGH (ref 0.3–1.2)
Total Protein: 7.2 g/dL (ref 6.5–8.1)

## 2015-11-01 LAB — DIFFERENTIAL
Basophils Absolute: 0.1 10*3/uL (ref 0.0–0.1)
Basophils Relative: 1 %
Eosinophils Absolute: 0.2 10*3/uL (ref 0.0–0.7)
Eosinophils Relative: 3 %
Lymphocytes Relative: 31 %
Lymphs Abs: 2.1 10*3/uL (ref 0.7–4.0)
Monocytes Absolute: 0.9 10*3/uL (ref 0.1–1.0)
Monocytes Relative: 13 %
Neutro Abs: 3.6 10*3/uL (ref 1.7–7.7)
Neutrophils Relative %: 53 %

## 2015-11-01 LAB — URINALYSIS, ROUTINE W REFLEX MICROSCOPIC
Glucose, UA: NEGATIVE mg/dL
Hgb urine dipstick: NEGATIVE
Ketones, ur: NEGATIVE mg/dL
Nitrite: NEGATIVE
Protein, ur: NEGATIVE mg/dL
Specific Gravity, Urine: 1.015 (ref 1.005–1.030)
pH: 6 (ref 5.0–8.0)

## 2015-11-01 LAB — CBC
HCT: 38.2 % — ABNORMAL LOW (ref 39.0–52.0)
Hemoglobin: 13.4 g/dL (ref 13.0–17.0)
MCH: 34.1 pg — ABNORMAL HIGH (ref 26.0–34.0)
MCHC: 35.1 g/dL (ref 30.0–36.0)
MCV: 97.2 fL (ref 78.0–100.0)
Platelets: 69 10*3/uL — ABNORMAL LOW (ref 150–400)
RBC: 3.93 MIL/uL — ABNORMAL LOW (ref 4.22–5.81)
RDW: 14.4 % (ref 11.5–15.5)
WBC: 6.8 10*3/uL (ref 4.0–10.5)

## 2015-11-01 LAB — LIPASE, BLOOD: Lipase: 55 U/L — ABNORMAL HIGH (ref 11–51)

## 2015-11-01 LAB — URINE MICROSCOPIC-ADD ON

## 2015-11-01 MED ORDER — POTASSIUM CHLORIDE CRYS ER 20 MEQ PO TBCR: EXTENDED_RELEASE_TABLET | ORAL | 5 refills | Status: DC

## 2015-11-01 MED ORDER — POTASSIUM CHLORIDE 10 MEQ/100ML IV SOLN
10.0000 meq | Freq: Once | INTRAVENOUS | Status: AC
  Administered 2015-11-01: 10 meq via INTRAVENOUS
  Filled 2015-11-01: qty 100

## 2015-11-01 MED ORDER — OXYCODONE HCL 5 MG PO TABS: 5.0000 mg | ORAL_TABLET | ORAL | 0 refills | Status: DC | PRN

## 2015-11-01 MED ORDER — ONDANSETRON HCL 4 MG/2ML IJ SOLN
4.0000 mg | Freq: Once | INTRAMUSCULAR | Status: AC
  Administered 2015-11-01: 4 mg via INTRAVENOUS
  Filled 2015-11-01: qty 2

## 2015-11-01 MED ORDER — MORPHINE SULFATE (PF) 4 MG/ML IV SOLN
4.0000 mg | Freq: Once | INTRAVENOUS | Status: AC
  Administered 2015-11-01: 4 mg via INTRAVENOUS
  Filled 2015-11-01: qty 1

## 2015-11-01 MED ORDER — HYDROMORPHONE HCL 1 MG/ML IJ SOLN
1.0000 mg | Freq: Once | INTRAMUSCULAR | Status: AC
  Administered 2015-11-01: 1 mg via INTRAVENOUS
  Filled 2015-11-01: qty 1

## 2015-11-01 NOTE — ED Triage Notes (Signed)
Per EMS, pt came in w/ abdominal pain "from below the rib cage to above the hip pain and all the way around."  Hx of cirrosis, "never this bad".

## 2015-11-01 NOTE — ED Provider Notes (Signed)
[facility]-EMERGENCY DEPT Provider Note   CSN: [id number] Arrival date & time: 11/01/15  0300  By signing my name below, I, Emmanuella Mensah, attest that this documentation has been prepared under the direction and in the presence of [name], MD. Electronically Signed: Angelene Giovanni, ED Scribe. 11/01/15. 3:25 AM.   History   Chief Complaint Chief Complaint  Patient presents with  . Abdominal Pain   HPI Comments: Jacob Hill is a 45 y.o. male with a hx of alcoholic cirrhosis of liver, GERD, and hypertension who presents to the Emergency Department complaining of gradually worsening intermittent severe RUQ pain that radiates toward his back onset 2 days ago, becoming constant several hours ago. He reports associated mild nausea. He notes that his episodes normally lasts for 30 minutes and is worse when he lays down at night. No alleviating factors noted. Pt has not tried any medications PTA. He states that he has been drinking approximately one 24 oz beer a day. He denies any fever, chills, or vomiting.   The history is provided by the patient. No language interpreter was used.  Abdominal Pain   This is a recurrent problem. The current episode started 2 days ago. The problem has been gradually worsening. The pain is located in the RUQ. The pain is severe. Associated symptoms include nausea. Pertinent negatives include fever and vomiting. The symptoms are aggravated by certain positions. Nothing relieves the symptoms. His past medical history is significant for GERD.    Past Medical History:  Diagnosis Date  . Cirrhosis (HCC)   . COPD (chronic obstructive pulmonary disease) (HCC)   . DJD (degenerative joint disease)   . GERD (gastroesophageal reflux disease)   . Hypertension   . Neuropathy (HCC)   . Osteoarthritis   . Seizure disorder (HCC)   . Seizures [facility])     Patient Active Problem List   Diagnosis Date Noted  . GAD (generalized anxiety disorder) 05/11/2014  .  Pernicious anemia 11/03/2013  . Essential hypertension, benign 06/30/2012  . Alcoholic cirrhosis of liver (HCC) 02/05/2012  . Vomiting 02/05/2012  . Routine general medical examination at a health care facility 09/09/2011  . DJD (degenerative joint disease), ankle and foot 11/08/2010  . PERIPHERAL NEUROPATHY 01/06/2009  . COPD 12/23/2007  . GERD 12/23/2007  . KIDNEY STONE 12/23/2007  . Convulsions (HCC) 12/23/2007    History reviewed. No pertinent surgical history.     Home Medications    Prior to Admission medications   Medication Sig Start Date End Date Taking? Authorizing Provider  diazepam (VALIUM) 10 MG tablet Take 0.5 tablets (5 mg total) by mouth every 8 (eight) hours as needed for anxiety. 09/13/14   Etta Grandchild, MD  folic acid (FOLVITE) 1 MG tablet Take 1 tablet (1 mg total) by mouth daily. 09/13/14   Etta Grandchild, MD  METHADONE HCL PO Take 25 mg by mouth daily.     Historical Provider, MD  mirtazapine (REMERON) 7.5 MG tablet Take 1 tablet (7.5 mg total) by mouth at bedtime. 09/16/14   Etta Grandchild, MD  potassium chloride SA (KLOR-CON M20) 20 MEQ tablet take 1 tablet by mouth twice a day 09/30/13   Etta Grandchild, MD  promethazine (PHENERGAN) 12.5 MG tablet Take 1 tablet (12.5 mg total) by mouth every 6 (six) hours as needed for nausea or vomiting. 12/04/14   Etta Grandchild, MD  propranolol (INDERAL) 40 MG tablet Take 1 tablet (40 mg total) by mouth 2 (two) times daily.  02/08/15   Etta Grandchildhomas L Jones, MD  thiamine (VITAMIN B-1) 50 MG tablet Take 1 tablet (50 mg total) by mouth daily. 11/04/13   Etta Grandchildhomas L Jones, MD    Family History Family History  Problem Relation Age of Onset  . Diabetes Other   . Hypertension Other   . Diabetes Father     Social History Social History  Substance Use Topics  . Smoking status: Current Every Day Smoker    Packs/day: 2.00    Years: 15.00    Types: Cigarettes  . Smokeless tobacco: Never Used  . Alcohol use 6.0 oz/week    10 Shots of  liquor per week     Comment: occasional      Allergies   Codeine and Nsaids   Review of Systems Review of Systems  Constitutional: Negative for chills and fever.  Gastrointestinal: Positive for abdominal pain and nausea. Negative for vomiting.  All other systems reviewed and are negative.    Physical Exam Updated Vital Signs BP 144/85   Pulse 113   Temp 98 F (36.7 C) (Oral)   Resp 19   SpO2 93%   Physical Exam  Constitutional: He is oriented to person, place, and time. He appears well-developed and well-nourished.  Appears uncomfortable   HENT:  Head: Normocephalic and atraumatic.  Eyes: EOM are normal. Pupils are equal, round, and reactive to light.  Neck: Normal range of motion. Neck supple. No JVD present.  Cardiovascular: Normal rate, regular rhythm and normal heart sounds.   No murmur heard. Pulmonary/Chest: Effort normal and breath sounds normal. He has no wheezes. He has no rales. He exhibits no tenderness.  Abdominal: Soft. Bowel sounds are normal. He exhibits no distension and no mass. There is tenderness in the right upper quadrant. There is positive Murphy's sign.  RUQ tenderness with positive Murphy sign  Musculoskeletal: Normal range of motion. He exhibits no edema.  Lymphadenopathy:    He has no cervical adenopathy.  Neurological: He is alert and oriented to person, place, and time. No cranial nerve deficit. He exhibits normal muscle tone. Coordination normal.  Skin: Skin is warm and dry. No rash noted.  Psychiatric: He has a normal mood and affect. His behavior is normal. Judgment and thought content normal.  Nursing note and vitals reviewed.    ED Treatments / Results  DIAGNOSTIC STUDIES: Oxygen Saturation is 93% on Room air, normal by my interpretation.    COORDINATION OF CARE: 3:17 AM- Pt advised of plan for treatment and pt agrees.    Labs (all labs ordered are listed, but only abnormal results are displayed) Labs Reviewed  LIPASE, BLOOD -  Abnormal; Notable for the following:       Result Value   Lipase 55 (*)    All other components within normal limits  COMPREHENSIVE METABOLIC PANEL - Abnormal; Notable for the following:    Potassium 2.9 (*)    BUN <5 (*)    Calcium 8.7 (*)    Albumin 3.0 (*)    AST 56 (*)    Total Bilirubin 2.5 (*)    All other components within normal limits  CBC - Abnormal; Notable for the following:    RBC 3.93 (*)    HCT 38.2 (*)    MCH 34.1 (*)    Platelets 69 (*)    All other components within normal limits  URINALYSIS, ROUTINE W REFLEX MICROSCOPIC (NOT AT [facility]) - Abnormal; Notable for the following:    APPearance CLOUDY (*)  Bilirubin Urine SMALL (*)    Leukocytes, UA TRACE (*)    All other components within normal limits  URINE MICROSCOPIC-ADD ON - Abnormal; Notable for the following:    Squamous Epithelial / LPF 0-5 (*)    Bacteria, UA RARE (*)    Casts HYALINE CASTS (*)    Crystals CA OXALATE CRYSTALS (*)    All other components within normal limits  DIFFERENTIAL    EKG  EKG Interpretation  Date/Time:  Wednesday November 01 2015 03:04:54 EDT Ventricular Rate:  112 PR Interval:    QRS Duration: 90 QT Interval:  360 QTC Calculation: 492 R Axis:   82 Text Interpretation:  Sinus tachycardia Ventricular premature complex Borderline prolonged QT interval Artifact in lead(s) I III aVR aVL aVF V1 V2 V3 V4 V5 and baseline wander in lead(s) V2 No old tracing to compare Confirmed by [facility]  MD, Lakeria Starkman ([id number]) on 11/01/2015 3:14:12 AM       Radiology US Abdomen Complete  Result Date: 11/01/2015 CLINICAL DATA:  Worsening right upper quadrant pain radiating to the back. Two days duration. EXAM: ABDOMEN ULTRASOUND COMPLETE COMPARISON:  None. FINDINGS: Gallbladder: Borderline mural thickening, 4 mm. No calculi. No sonographic Murphy sign noted by sonographer. Common bile duct: Diameter: 4 mm Liver: Diffusely coarsened echotexture. Irregular contours consistent with cirrhosis. No  suspicious focal liver lesion. IVC: No abnormality visualized. Pancreas: Visualized portion unremarkable. Spleen: Size and appearance within normal limits. Right Kidney: Length: 12.3 cm. Echogenicity within normal limits. No mass or hydronephrosis visualized. Left Kidney: Length: 12.6 cm. Echogenicity within normal limits. No mass or hydronephrosis visualized. Abdominal aorta: No aneurysm visualized. Other findings: No ascites is evident. IMPRESSION: Cirrhotic appearing liver without suspicious focal lesion. No bile duct dilatation. Borderline gallbladder mural thickening, 4 mm. The patient was not tender to probe pressure over the gallbladder. Electronically Signed   By: Ellery Plunk M.D.   On: 11/01/2015 05:10    Procedures Procedures (including critical care time)  Medications Ordered in ED Medications  morphine 4 MG/ML injection 4 mg (4 mg Intravenous Given 11/01/15 0359)  ondansetron (ZOFRAN) injection 4 mg (4 mg Intravenous Given 11/01/15 0359)  HYDROmorphone (DILAUDID) injection 1 mg (1 mg Intravenous Given 11/01/15 0533)  potassium chloride 10 mEq in 100 mL IVPB (0 mEq Intravenous Stopped 11/01/15 6045)     Initial Impression / Assessment and Plan / ED Course  [name], MD has reviewed the triage vital signs and the nursing notes.  Pertinent labs & imaging results that were available during my care of the patient were reviewed by me and considered in my medical decision making (see chart for details).  Clinical Course    Right upper quadrant pain in patient with known cirrhosis. Please given morphine for pain and sent for ultrasound to rule out cholelithiasis. Old records are reviewed showing outpatient management of his cirrhosis. Ultrasound showed no evidence of cholelithiasis or cholecystitis. Lipase is come back mildly elevated but probably not clinically significant and lower than it had been when last checked. I doubt that he has pancreatitis. He did not get adequate pain  relief with morphine was given a dose of hydromorphone with significantly improved relief. I will do workup does show mild anemia which is stable, and significant hypokalemia. Is given intravenous and oral potassium. He is concerned about his alcohol abuse and states that he only drinks to keep from having withdrawal seizures. He does want to go through a detox program. He given outpatient resources as well as  options for residential treatment. He is given a prescription for oxycodone and K-Dur. His record on the [place] controlled substance reporting website is reviewed and he only had one prescription for narcotics in the last 6 months.  Final Clinical Impressions(s) / ED Diagnoses   Final diagnoses:  Right upper quadrant pain  Hypokalemia  Elevated lipase  Alcoholic cirrhosis of liver without ascites (HCC)    New Prescriptions New Prescriptions   OXYCODONE (ROXICODONE) 5 MG IMMEDIATE RELEASE TABLET    Take 1 tablet (5 mg total) by mouth every 4 (four) hours as needed for severe pain.   I personally performed the services described in this documentation, which was scribed in my presence. The recorded information has been reviewed and is accurate.       [name], MD 11/01/15 [phone]

## 2015-11-01 NOTE — ED Notes (Signed)
Patient transported to Ultrasound 

## 2015-11-06 ENCOUNTER — Encounter (HOSPITAL_BASED_OUTPATIENT_CLINIC_OR_DEPARTMENT_OTHER): Payer: Self-pay | Admitting: Emergency Medicine

## 2015-11-06 DIAGNOSIS — K210 Gastro-esophageal reflux disease with esophagitis: Secondary | ICD-10-CM | POA: Diagnosis present

## 2015-11-06 DIAGNOSIS — G40909 Epilepsy, unspecified, not intractable, without status epilepticus: Secondary | ICD-10-CM | POA: Diagnosis present

## 2015-11-06 DIAGNOSIS — K7011 Alcoholic hepatitis with ascites: Principal | ICD-10-CM | POA: Diagnosis present

## 2015-11-06 DIAGNOSIS — Z833 Family history of diabetes mellitus: Secondary | ICD-10-CM

## 2015-11-06 DIAGNOSIS — K3189 Other diseases of stomach and duodenum: Secondary | ICD-10-CM | POA: Diagnosis present

## 2015-11-06 DIAGNOSIS — D649 Anemia, unspecified: Secondary | ICD-10-CM | POA: Diagnosis present

## 2015-11-06 DIAGNOSIS — Z23 Encounter for immunization: Secondary | ICD-10-CM

## 2015-11-06 DIAGNOSIS — K2970 Gastritis, unspecified, without bleeding: Secondary | ICD-10-CM | POA: Diagnosis present

## 2015-11-06 DIAGNOSIS — F1210 Cannabis abuse, uncomplicated: Secondary | ICD-10-CM | POA: Diagnosis present

## 2015-11-06 DIAGNOSIS — F1010 Alcohol abuse, uncomplicated: Secondary | ICD-10-CM | POA: Diagnosis present

## 2015-11-06 DIAGNOSIS — Z8249 Family history of ischemic heart disease and other diseases of the circulatory system: Secondary | ICD-10-CM

## 2015-11-06 DIAGNOSIS — K2980 Duodenitis without bleeding: Secondary | ICD-10-CM | POA: Diagnosis present

## 2015-11-06 DIAGNOSIS — F1410 Cocaine abuse, uncomplicated: Secondary | ICD-10-CM | POA: Diagnosis present

## 2015-11-06 DIAGNOSIS — E876 Hypokalemia: Secondary | ICD-10-CM | POA: Diagnosis present

## 2015-11-06 DIAGNOSIS — D6959 Other secondary thrombocytopenia: Secondary | ICD-10-CM | POA: Diagnosis present

## 2015-11-06 DIAGNOSIS — K7031 Alcoholic cirrhosis of liver with ascites: Secondary | ICD-10-CM | POA: Diagnosis present

## 2015-11-06 DIAGNOSIS — I864 Gastric varices: Secondary | ICD-10-CM | POA: Diagnosis present

## 2015-11-06 DIAGNOSIS — K766 Portal hypertension: Secondary | ICD-10-CM | POA: Diagnosis present

## 2015-11-06 DIAGNOSIS — I10 Essential (primary) hypertension: Secondary | ICD-10-CM | POA: Diagnosis present

## 2015-11-06 DIAGNOSIS — J449 Chronic obstructive pulmonary disease, unspecified: Secondary | ICD-10-CM | POA: Diagnosis present

## 2015-11-06 DIAGNOSIS — I8500 Esophageal varices without bleeding: Secondary | ICD-10-CM | POA: Diagnosis present

## 2015-11-06 DIAGNOSIS — F17210 Nicotine dependence, cigarettes, uncomplicated: Secondary | ICD-10-CM | POA: Diagnosis present

## 2015-11-06 NOTE — ED Triage Notes (Signed)
RUQ pain x9 days.  [facility] for same. Sts it is no better. Denies N/V/D.

## 2015-11-07 ENCOUNTER — Other Ambulatory Visit: Payer: Self-pay

## 2015-11-07 ENCOUNTER — Emergency Department (HOSPITAL_BASED_OUTPATIENT_CLINIC_OR_DEPARTMENT_OTHER): Payer: Self-pay

## 2015-11-07 ENCOUNTER — Encounter (HOSPITAL_BASED_OUTPATIENT_CLINIC_OR_DEPARTMENT_OTHER): Payer: Self-pay | Admitting: Internal Medicine

## 2015-11-07 ENCOUNTER — Inpatient Hospital Stay (HOSPITAL_BASED_OUTPATIENT_CLINIC_OR_DEPARTMENT_OTHER)
Admission: EM | Admit: 2015-11-07 | Discharge: 2015-11-10 | DRG: 433 | Disposition: A | Discharge status: Home / Self Care | Payer: Self-pay | Attending: Internal Medicine | Admitting: Internal Medicine

## 2015-11-07 DIAGNOSIS — K766 Portal hypertension: Secondary | ICD-10-CM

## 2015-11-07 DIAGNOSIS — R1010 Upper abdominal pain, unspecified: Secondary | ICD-10-CM

## 2015-11-07 DIAGNOSIS — F1010 Alcohol abuse, uncomplicated: Secondary | ICD-10-CM | POA: Diagnosis present

## 2015-11-07 DIAGNOSIS — I864 Gastric varices: Secondary | ICD-10-CM | POA: Diagnosis present

## 2015-11-07 DIAGNOSIS — Z720 Tobacco use: Secondary | ICD-10-CM | POA: Diagnosis present

## 2015-11-07 DIAGNOSIS — J441 Chronic obstructive pulmonary disease with (acute) exacerbation: Secondary | ICD-10-CM | POA: Diagnosis present

## 2015-11-07 DIAGNOSIS — D696 Thrombocytopenia, unspecified: Secondary | ICD-10-CM | POA: Diagnosis present

## 2015-11-07 DIAGNOSIS — R109 Unspecified abdominal pain: Secondary | ICD-10-CM

## 2015-11-07 DIAGNOSIS — F1210 Cannabis abuse, uncomplicated: Secondary | ICD-10-CM | POA: Diagnosis present

## 2015-11-07 DIAGNOSIS — E876 Hypokalemia: Secondary | ICD-10-CM | POA: Diagnosis present

## 2015-11-07 DIAGNOSIS — I10 Essential (primary) hypertension: Secondary | ICD-10-CM

## 2015-11-07 DIAGNOSIS — K7030 Alcoholic cirrhosis of liver without ascites: Secondary | ICD-10-CM | POA: Diagnosis present

## 2015-11-07 DIAGNOSIS — K838 Other specified diseases of biliary tract: Secondary | ICD-10-CM

## 2015-11-07 DIAGNOSIS — R7989 Other specified abnormal findings of blood chemistry: Secondary | ICD-10-CM

## 2015-11-07 DIAGNOSIS — R945 Abnormal results of liver function studies: Secondary | ICD-10-CM

## 2015-11-07 DIAGNOSIS — F1410 Cocaine abuse, uncomplicated: Secondary | ICD-10-CM

## 2015-11-07 DIAGNOSIS — G8929 Other chronic pain: Secondary | ICD-10-CM

## 2015-11-07 DIAGNOSIS — K219 Gastro-esophageal reflux disease without esophagitis: Secondary | ICD-10-CM | POA: Diagnosis present

## 2015-11-07 DIAGNOSIS — R1011 Right upper quadrant pain: Secondary | ICD-10-CM

## 2015-11-07 DIAGNOSIS — K839 Disease of biliary tract, unspecified: Secondary | ICD-10-CM

## 2015-11-07 DIAGNOSIS — K3189 Other diseases of stomach and duodenum: Secondary | ICD-10-CM

## 2015-11-07 DIAGNOSIS — I8500 Esophageal varices without bleeding: Secondary | ICD-10-CM

## 2015-11-07 HISTORY — DX: Tobacco use: Z72.0

## 2015-11-07 HISTORY — DX: Cannabis abuse, uncomplicated: F12.10

## 2015-11-07 HISTORY — DX: Alcohol abuse, uncomplicated: F10.10

## 2015-11-07 HISTORY — DX: Coagulation defect, unspecified: D68.9

## 2015-11-07 HISTORY — DX: Alcoholic cirrhosis of liver without ascites: K70.30

## 2015-11-07 HISTORY — DX: Opioid dependence, uncomplicated: F11.20

## 2015-11-07 HISTORY — DX: Thrombocytopenia, unspecified: D69.6

## 2015-11-07 LAB — CBC WITH DIFFERENTIAL/PLATELET
Basophils Absolute: 0.1 10*3/uL (ref 0.0–0.1)
Basophils Relative: 1 %
Eosinophils Absolute: 0.2 10*3/uL (ref 0.0–0.7)
Eosinophils Relative: 3 %
HCT: 38.7 % — ABNORMAL LOW (ref 39.0–52.0)
Hemoglobin: 13.9 g/dL (ref 13.0–17.0)
Lymphocytes Relative: 27 %
Lymphs Abs: 1.6 10*3/uL (ref 0.7–4.0)
MCH: 34.6 pg — ABNORMAL HIGH (ref 26.0–34.0)
MCHC: 35.9 g/dL (ref 30.0–36.0)
MCV: 96.3 fL (ref 78.0–100.0)
Monocytes Absolute: 0.9 10*3/uL (ref 0.1–1.0)
Monocytes Relative: 15 %
Neutro Abs: 3.3 10*3/uL (ref 1.7–7.7)
Neutrophils Relative %: 54 %
Platelets: 60 10*3/uL — ABNORMAL LOW (ref 150–400)
RBC: 4.02 MIL/uL — ABNORMAL LOW (ref 4.22–5.81)
RDW: 13.5 % (ref 11.5–15.5)
WBC: 6.1 10*3/uL (ref 4.0–10.5)

## 2015-11-07 LAB — URINALYSIS, ROUTINE W REFLEX MICROSCOPIC
Bilirubin Urine: NEGATIVE
Glucose, UA: NEGATIVE mg/dL
Hgb urine dipstick: NEGATIVE
Ketones, ur: NEGATIVE mg/dL
Leukocytes, UA: NEGATIVE
Nitrite: NEGATIVE
Protein, ur: NEGATIVE mg/dL
Specific Gravity, Urine: 1.009 (ref 1.005–1.030)
pH: 7 (ref 5.0–8.0)

## 2015-11-07 LAB — LIPASE, BLOOD: Lipase: 61 U/L — ABNORMAL HIGH (ref 11–51)

## 2015-11-07 LAB — COMPREHENSIVE METABOLIC PANEL
ALT: 30 U/L (ref 17–63)
AST: 61 U/L — ABNORMAL HIGH (ref 15–41)
Albumin: 3.2 g/dL — ABNORMAL LOW (ref 3.5–5.0)
Alkaline Phosphatase: 58 U/L (ref 38–126)
Anion gap: 7 (ref 5–15)
BUN: 8 mg/dL (ref 6–20)
CO2: 27 mmol/L (ref 22–32)
Calcium: 9.8 mg/dL (ref 8.9–10.3)
Chloride: 104 mmol/L (ref 101–111)
Creatinine, Ser: 0.92 mg/dL (ref 0.61–1.24)
GFR calc Af Amer: >60 mL/min (ref >60)
GFR calc non Af Amer: >60 mL/min (ref >60)
Glucose, Bld: 142 mg/dL — ABNORMAL HIGH (ref 65–99)
Potassium: 3 mmol/L — ABNORMAL LOW (ref 3.5–5.1)
Sodium: 138 mmol/L (ref 135–145)
Total Bilirubin: 2.9 mg/dL — ABNORMAL HIGH (ref 0.3–1.2)
Total Protein: 7.4 g/dL (ref 6.5–8.1)

## 2015-11-07 LAB — RAPID URINE DRUG SCREEN, HOSP PERFORMED
Amphetamines: NOT DETECTED
Barbiturates: NOT DETECTED
Benzodiazepines: NOT DETECTED
Cocaine: POSITIVE — AB
Opiates: POSITIVE — AB
Tetrahydrocannabinol: NOT DETECTED

## 2015-11-07 LAB — TYPE AND SCREEN
ABO/RH(D): B POS
Antibody Screen: NEGATIVE

## 2015-11-07 LAB — PROTIME-INR
INR: 1.45
Prothrombin Time: 17.8 seconds — ABNORMAL HIGH (ref 11.4–15.2)

## 2015-11-07 LAB — LACTIC ACID, PLASMA
Lactic Acid, Venous: 1.2 mmol/L (ref 0.5–1.9)
Lactic Acid, Venous: 1.2 mmol/L (ref 0.5–1.9)

## 2015-11-07 LAB — GLUCOSE, CAPILLARY: Glucose-Capillary: 77 mg/dL (ref 65–99)

## 2015-11-07 LAB — ABO/RH: ABO/RH(D): B POS

## 2015-11-07 LAB — MAGNESIUM: Magnesium: 1.4 mg/dL — ABNORMAL LOW (ref 1.7–2.4)

## 2015-11-07 LAB — APTT: aPTT: 36 seconds (ref 24–36)

## 2015-11-07 MED ORDER — HYDROMORPHONE HCL 1 MG/ML IJ SOLN
1.0000 mg | Freq: Once | INTRAMUSCULAR | Status: AC
  Administered 2015-11-07: 1 mg via INTRAVENOUS
  Filled 2015-11-07: qty 1

## 2015-11-07 MED ORDER — HYDRALAZINE HCL 20 MG/ML IJ SOLN: 5.0000 mg | INTRAMUSCULAR | Status: DC | PRN

## 2015-11-07 MED ORDER — SODIUM CHLORIDE 0.9 % IV SOLN
Freq: Once | INTRAVENOUS | Status: AC
  Administered 2015-11-07: 04:00:00 via INTRAVENOUS

## 2015-11-07 MED ORDER — SODIUM CHLORIDE 0.9 % IV BOLUS (SEPSIS)
1000.0000 mL | Freq: Once | INTRAVENOUS | Status: AC
  Administered 2015-11-07: 1000 mL via INTRAVENOUS

## 2015-11-07 MED ORDER — KCL IN DEXTROSE-NACL 20-5-0.9 MEQ/L-%-% IV SOLN
INTRAVENOUS | Status: AC
  Administered 2015-11-07: 09:00:00 via INTRAVENOUS
  Filled 2015-11-07: qty 1000

## 2015-11-07 MED ORDER — POTASSIUM CHLORIDE 20 MEQ/15ML (10%) PO SOLN
40.0000 meq | Freq: Once | ORAL | Status: AC
  Administered 2015-11-07: 40 meq via ORAL
  Filled 2015-11-07: qty 30

## 2015-11-07 MED ORDER — SODIUM CHLORIDE 0.9% FLUSH
3.0000 mL | Freq: Two times a day (BID) | INTRAVENOUS | Status: DC
  Administered 2015-11-09: 3 mL via INTRAVENOUS

## 2015-11-07 MED ORDER — PANTOPRAZOLE SODIUM 40 MG PO TBEC
40.0000 mg | DELAYED_RELEASE_TABLET | Freq: Every day | ORAL | Status: DC
  Administered 2015-11-07 – 2015-11-10 (×3): 40 mg via ORAL
  Filled 2015-11-07 (×3): qty 1

## 2015-11-07 MED ORDER — MORPHINE SULFATE (PF) 4 MG/ML IV SOLN
4.0000 mg | Freq: Once | INTRAVENOUS | Status: AC
  Administered 2015-11-07: 4 mg via INTRAVENOUS
  Filled 2015-11-07: qty 1

## 2015-11-07 MED ORDER — ORAL CARE MOUTH RINSE
15.0000 mL | Freq: Two times a day (BID) | OROMUCOSAL | Status: DC
  Administered 2015-11-07 – 2015-11-10 (×4): 15 mL via OROMUCOSAL

## 2015-11-07 MED ORDER — INFLUENZA VAC SPLIT QUAD 0.5 ML IM SUSY
0.5000 mL | PREFILLED_SYRINGE | INTRAMUSCULAR | Status: AC
  Administered 2015-11-09: 0.5 mL via INTRAMUSCULAR
  Filled 2015-11-07 (×2): qty 0.5

## 2015-11-07 MED ORDER — SODIUM CHLORIDE 0.9 % IV SOLN
Freq: Once | INTRAVENOUS | Status: AC
  Administered 2015-11-07: 06:00:00 via INTRAVENOUS

## 2015-11-07 MED ORDER — IOPAMIDOL (ISOVUE-300) INJECTION 61%
100.0000 mL | Freq: Once | INTRAVENOUS | Status: AC | PRN
  Administered 2015-11-07: 100 mL via INTRAVENOUS

## 2015-11-07 MED ORDER — PROPRANOLOL HCL ER 60 MG PO CP24
60.0000 mg | ORAL_CAPSULE | Freq: Every day | ORAL | Status: DC
  Administered 2015-11-07 – 2015-11-10 (×3): 60 mg via ORAL
  Filled 2015-11-07 (×4): qty 1

## 2015-11-07 MED ORDER — ONDANSETRON HCL 4 MG/2ML IJ SOLN: 4.0000 mg | Freq: Three times a day (TID) | INTRAMUSCULAR | Status: DC | PRN

## 2015-11-07 MED ORDER — OXYCODONE HCL 5 MG PO TABS
5.0000 mg | ORAL_TABLET | ORAL | Status: DC | PRN
  Administered 2015-11-07 – 2015-11-10 (×13): 5 mg via ORAL
  Filled 2015-11-07 (×13): qty 1

## 2015-11-07 MED ORDER — PIPERACILLIN-TAZOBACTAM 3.375 G IVPB
3.3750 g | Freq: Three times a day (TID) | INTRAVENOUS | Status: DC
  Administered 2015-11-07 – 2015-11-10 (×9): 3.375 g via INTRAVENOUS
  Filled 2015-11-07 (×9): qty 50

## 2015-11-07 MED ORDER — SODIUM CHLORIDE 0.9 % IV BOLUS (SEPSIS)
1500.0000 mL | Freq: Once | INTRAVENOUS | Status: AC
  Administered 2015-11-07: 1500 mL via INTRAVENOUS

## 2015-11-07 MED ORDER — MAGNESIUM SULFATE 2 GM/50ML IV SOLN
2.0000 g | Freq: Once | INTRAVENOUS | Status: AC
  Administered 2015-11-07: 2 g via INTRAVENOUS
  Filled 2015-11-07: qty 50

## 2015-11-07 MED ORDER — NICOTINE 21 MG/24HR TD PT24
21.0000 mg | MEDICATED_PATCH | Freq: Every day | TRANSDERMAL | Status: DC
  Filled 2015-11-07 (×3): qty 1

## 2015-11-07 MED ORDER — HYDROMORPHONE HCL 1 MG/ML IJ SOLN
1.0000 mg | INTRAMUSCULAR | Status: DC | PRN
  Administered 2015-11-07 – 2015-11-09 (×11): 1 mg via INTRAVENOUS
  Filled 2015-11-07 (×12): qty 1

## 2015-11-07 MED ORDER — ONDANSETRON HCL 4 MG/2ML IJ SOLN
4.0000 mg | Freq: Once | INTRAMUSCULAR | Status: AC
Start: 2015-11-07 — End: 2015-11-07
  Administered 2015-11-07: 4 mg via INTRAVENOUS
  Filled 2015-11-07: qty 2

## 2015-11-07 NOTE — Progress Notes (Signed)
This is a no charge note  [facility] per Dr. Wilkie AyeHorton  45 year old man with past medical history of alcohol abuse, tobacco abuse, marijuana abuse, alcoholic liver cirrhosis without ascites, hypertension, COPD, GERD, neuropathy, who presents with abdominal pain for 9 days.  Patient was seen in ED of [facility] on 11/01/15. He had US-abd which showed no evidence of cholelithiasis or cholecystitis and also no ascites.   Today CBC 6.1, temp 99.2, tachycardia, potassium 7.0, creatinine normal, negative urinalysis, lipase 61. Pt is accepted to tele bed as in pt. GI, Dr. Christella HartiganJacobs was consulted by EDP. Please inform GI at pt's arrival.  # CT abdomen/pelvis showed 1. new dilatation of the common bile duct to 1.2 cm, with minimal intrahepatic biliary ductal dilatation. Wall thickening noted with regard to the gallbladder, with mild surrounding soft tissue inflammation, raising question for mild cholecystitis. 2. Gastric and splenic varices noted. One of the gastric varices is significantly enlarged, extending into the wall of the gastric fundus. 3. Mildly nodular contour to the liver is concerning for hepatic cirrhosis. Nonspecific small hypodensities within the liver measure up to 1.0 cm in size. 4. Splenomegaly. 5. Scattered calcification along the abdominal aorta and its branches.  Lorretta HarpXilin Chanette Demo, MD  [facility] Pager [phone]  If 7PM-7AM, please contact night-coverage [email or web address] Password [email or web address] 11/07/2015, 3:44 AM

## 2015-11-07 NOTE — H&P (Signed)
History and Physical    Jacob Hill RUE:[id number] DOB: [birth year] DOA: 11/07/2015  Referring MD/NP/PA:   PCP: Sanda Linger, MD   Patient coming from:  The patient is coming from home.  At baseline, pt is independent for most of ADL.   Chief Complaint: Abdominal pain  HPI: Jacob Hill is a 45 y.o. male with medical history significant of polysubstance abuse including alcohol, tobacco and marijuana, hypertension, COPD, GERD, liver cirrhosis, thrombocytopenia, neuropathy, who presents with abdominal pain.  Patient states that he has been having abdominal pain for 9 days. His abdominal pain is located in the right upper quadrant, constant, 9 out of 10 in severity, dull and also sharp, radiating to the back. It is aggravated by movement. Patient does not have fever, chills, nausea, vomiting, diarrhea. Patient was seen in [facility] ED on 11/01/15. He had US-abd which showed no evidence of cholelithiasis or cholecystitis and also no ascites. Patient denies chest pain, shortness of breath, cough, symptoms of a UTI or unilateral weakness. Patient states that he did not drink alcohol in the past 7 days.  ED Course: pt was found to have CBC 6.1, temp 99.2, tachycardia, potassium 7.0, creatinine normal, negative urinalysis, lipase 61. Pt is admitted to tele bed as in pt. GI, Dr. Christella Hartigan was consulted by EDP.   # CT abdomen/pelvis showed 1. new dilatation of the common bile duct to 1.2 cm, with minimal intrahepatic biliary ductal dilatation. Wall thickening noted with regard to the gallbladder, with mild surrounding soft tissue inflammation, raising question for mild cholecystitis. 2. Gastric and splenic varices noted. One of the gastric varices is significantly enlarged, extending into the wall of the gastric fundus. 3. Mildly nodular contour to the liver is concerning for hepatic cirrhosis. Nonspecific small hypodensities within the liver measure up to 1.0 cm in size. 4. Splenomegaly. 5. Scattered  calcification along the abdominal aorta and its branches.   Review of Systems:   General: no fevers, chills, no changes in body weight, has poor appetite, has fatigue HEENT: no blurry vision, hearing changes or sore throat Respiratory: no dyspnea, coughing, wheezing CV: no chest pain, no palpitations GI: has abdominal pain, no nausea, vomiting, diarrhea, constipation GU: no dysuria, burning on urination, increased urinary frequency, hematuria  Ext: no leg edema Neuro: no unilateral weakness, numbness, or tingling, no vision change or hearing loss Skin: no rash, no skin tear. MSK: No muscle spasm, no deformity, no limitation of range of movement in spin Heme: No easy bruising.  Travel history: No recent long distant travel.  Allergy:  Allergies  Allergen Reactions  . Codeine Nausea And Vomiting  . Nsaids Nausea And Vomiting    Past Medical History:  Diagnosis Date  . Alcohol abuse   . Cirrhosis (HCC)   . COPD (chronic obstructive pulmonary disease) (HCC)   . DJD (degenerative joint disease)   . GERD (gastroesophageal reflux disease)   . Hypertension   . Marijuana abuse   . Neuropathy (HCC)   . Osteoarthritis   . Seizure disorder (HCC)   . Seizures (HCC)   . Tobacco abuse     Past Surgical History:  Procedure Laterality Date  . HEMORRHOID SURGERY      Social History:  reports that he has been smoking Cigarettes.  He has a 7.50 pack-year smoking history. He has never used smokeless tobacco. He reports that he drinks about 5.4 oz of alcohol per week . He reports that he uses drugs, including Marijuana.  Family History:  Family History  Problem Relation Age of Onset  . Diabetes Other   . Hypertension Other   . Diabetes Father      Prior to Admission medications   Medication Sig Start Date End Date Taking? Authorizing Provider  acetaminophen (TYLENOL) 500 MG tablet Take 500 mg by mouth every 6 (six) hours as needed for mild pain, moderate pain or headache.    Yes  Historical Provider, MD  ferrous sulfate 325 (65 FE) MG tablet Take 325 mg by mouth daily with breakfast.   Yes Historical Provider, MD  potassium chloride SA (K-DUR,KLOR-CON) 20 MEQ tablet Take 20 mEq by mouth 2 (two) times daily.   Yes Historical Provider, MD    Physical Exam: Vitals:   11/06/15 2355 11/06/15 2357 11/06/15 2358  BP: (!) 155/112  (!) 141/107  Pulse: (!) 129    Resp: 20    Temp: 99.2 F (37.3 C)    TempSrc: Oral    SpO2: 100%    Weight:   76.6 kg (168 lb 14.4 oz)  Height:  6' (1.829 m)    General: Not in acute distress HEENT:       Eyes: PERRL, EOMI, no scleral icterus.       ENT: No discharge from the ears and nose, no pharynx injection, no tonsillar enlargement.        Neck: No JVD, no bruit, no mass felt. Heme: No neck lymph node enlargement. Cardiac: S1/S2, RRR, No murmurs, No gallops or rubs. Respiratory:  No rales, wheezing, rhonchi or rubs. GI: Soft, nondistended, tenderness over RUQ, no rebound pain, no organomegaly, BS present. GU: No hematuria Ext: No pitting leg edema bilaterally. 2+DP/PT pulse bilaterally. Musculoskeletal: No joint deformities, No joint redness or warmth, no limitation of ROM in spin. Skin: No rashes.  Neuro: Alert, oriented X3, cranial nerves II-XII grossly intact, moves all extremities normally. Psych: Patient is not psychotic, no suicidal or hemocidal ideation.  Labs on Admission: I have personally reviewed following labs and imaging studies  CBC:  Recent Labs Lab 11/01/15 0340 11/07/15 0100  WBC 6.8 6.1  NEUTROABS 3.6 3.3  HGB 13.4 13.9  HCT 38.2* 38.7*  MCV 97.2 96.3  PLT 69* 60*   Basic Metabolic Panel:  Recent Labs Lab 11/01/15 0340 11/07/15 0100 11/07/15 0602  NA 139 138  --   K 2.9* 3.0*  --   CL 106 104  --   CO2 24 27  --   GLUCOSE 91 142*  --   BUN <5* 8  --   CREATININE 0.87 0.92  --   CALCIUM 8.7* 9.8  --   MG  --   --  1.4*   GFR: Estimated Creatinine Clearance: 111 mL/min (by C-G formula  based on SCr of 0.92 mg/dL). Liver Function Tests:  Recent Labs Lab 11/01/15 0340 11/07/15 0100  AST 56* 61*  ALT 27 30  ALKPHOS 63 58  BILITOT 2.5* 2.9*  PROT 7.2 7.4  ALBUMIN 3.0* 3.2*    Recent Labs Lab 11/01/15 0340 11/07/15 0100  LIPASE 55* 61*   No results for input(s): AMMONIA in the last 168 hours. Coagulation Profile:  Recent Labs Lab 11/07/15 0602  INR 1.45   Cardiac Enzymes: No results for input(s): CKTOTAL, CKMB, CKMBINDEX, TROPONINI in the last 168 hours. BNP (last 3 results) No results for input(s): PROBNP in the last 8760 hours. HbA1C: No results for input(s): HGBA1C in the last 72 hours. CBG: No results for input(s): GLUCAP in the last 168  hours. Lipid Profile: No results for input(s): CHOL, HDL, LDLCALC, TRIG, CHOLHDL, LDLDIRECT in the last 72 hours. Thyroid Function Tests: No results for input(s): TSH, T4TOTAL, FREET4, T3FREE, THYROIDAB in the last 72 hours. Anemia Panel: No results for input(s): VITAMINB12, FOLATE, FERRITIN, TIBC, IRON, RETICCTPCT in the last 72 hours. Urine analysis:    Component Value Date/Time   COLORURINE YELLOW 11/07/2015 0110   APPEARANCEUR CLEAR 11/07/2015 0110   LABSPEC 1.009 11/07/2015 0110   PHURINE 7.0 11/07/2015 0110   GLUCOSEU NEGATIVE 11/07/2015 0110   GLUCOSEU 100 02/05/2012 0947   HGBUR NEGATIVE 11/07/2015 0110   BILIRUBINUR NEGATIVE 11/07/2015 0110   KETONESUR NEGATIVE 11/07/2015 0110   PROTEINUR NEGATIVE 11/07/2015 0110   UROBILINOGEN >=8.0 02/05/2012 0947   NITRITE NEGATIVE 11/07/2015 0110   LEUKOCYTESUR NEGATIVE 11/07/2015 0110   Sepsis Labs: @LABRCNTIP (procalcitonin:4,lacticidven:4) )No results found for this or any previous visit (from the past 240 hour(s)).   Radiological Exams on Admission: Ct Abdomen Pelvis W Wo Contrast  Result Date: 11/07/2015 CLINICAL DATA:  Acute onset of right upper quadrant abdominal pain. Increased urinary frequency. Chronic hepatic cirrhosis. Initial encounter.  EXAM: CT ABDOMEN AND PELVIS WITHOUT AND WITH CONTRAST TECHNIQUE: Multidetector CT imaging of the abdomen and pelvis was performed following the standard protocol before and following the bolus administration of intravenous contrast. CONTRAST:  ISOVUE-300 IOPAMIDOL (ISOVUE-300) INJECTION 61% COMPARISON:  CT of the abdomen and pelvis from 02/10/2012, and abdominal ultrasound performed 11/01/2015 FINDINGS: Lower chest: The visualized lung bases are grossly clear. The visualized portions of the mediastinum are unremarkable. Hepatobiliary: There is a mildly nodular contour to the liver, concerning for hepatic cirrhosis. Small nonspecific hypodensities are noted within the liver, measuring up to 1.0 cm in size. Gastric and splenic varices are noted. One of the gastric varices is significantly enlarged, extending into the wall of the gastric fundus. Wall thickening is noted with regard to the gallbladder, with mild surrounding soft tissue inflammation. There is dilatation of the common bile duct to 1.2 cm, new from the prior study, with minimal intrahepatic biliary ductal dilatation. Pancreas: The pancreas is within normal limits. Spleen: The spleen is enlarged, measuring 14.1 cm in length. Adrenals/Urinary Tract: The adrenal glands are unremarkable in appearance. The kidneys are within normal limits. There is no evidence of hydronephrosis. No renal or ureteral stones are identified. No perinephric stranding is seen. Stomach/Bowel: The stomach is unremarkable in appearance. The small bowel is within normal limits. The appendix is normal in caliber, without evidence of appendicitis. The colon is unremarkable in appearance. Vascular/Lymphatic: Scattered calcification is seen along the abdominal aorta and its branches. The abdominal aorta is otherwise grossly unremarkable. The inferior vena cava is grossly unremarkable. No retroperitoneal lymphadenopathy is seen. No pelvic sidewall lymphadenopathy is identified.  Reproductive: The bladder is moderately distended and within normal limits. The uterus is grossly unremarkable in appearance. The ovaries are relatively symmetric. No suspicious adnexal masses are seen. Other: No additional soft tissue abnormalities are seen. Musculoskeletal: No acute osseous abnormalities are identified. Vacuum phenomenon is noted at the lower lumbar spine. The visualized musculature is unremarkable in appearance. IMPRESSION: 1. New dilatation of the common bile duct to 1.2 cm, with minimal intrahepatic biliary ductal dilatation. Wall thickening noted with regard to the gallbladder, with mild surrounding soft tissue inflammation, raising question for mild cholecystitis. ERCP or MRCP could be considered for further evaluation, to assess for underlying stone. 2. Gastric and splenic varices noted. One of the gastric varices is significantly enlarged, extending into the  wall of the gastric fundus. 3. Mildly nodular contour to the liver is concerning for hepatic cirrhosis. Nonspecific small hypodensities within the liver measure up to 1.0 cm in size. 4. Splenomegaly. 5. Scattered calcification along the abdominal aorta and its branches. Electronically Signed   By: Roanna RaiderJeffery  Chang M.D.   On: 11/07/2015 02:44     EKG:  Not done in ED, will get one.   Assessment/Plan Principal Problem:   Abdominal pain Active Problems:   COPD (chronic obstructive pulmonary disease) (HCC)   Alcoholic cirrhosis of liver (HCC)   Hypokalemia   Essential hypertension, benign   GERD (gastroesophageal reflux disease)   Marijuana abuse   Alcohol abuse   Tobacco abuse   Thrombocytopenia (HCC)   Gastric varices   Common bile duct dilation   Abdominal pain: CT scan showed new dilatation of the common bile duct to 1.2 cm, with possible mild cholecystitis. Patient does not have leukocytosis, temperature 99.2. Clinically not septic, but lactate level is pending. Hemodynamically stable currently. GI Dr. Christella HartiganJacobs was  consulted.  -will admit to tele bed as inpt -start IV zosyn -get blood culture if develops fever -Zofran for nausea, oxycodone and Dilaudid for pain -Follow-up GI's recommendation  Alcoholic cirrhosis of liver and Gastric varices: Patient likely has portal hypertension. Patient is at high risk of developing GI bleeding. -Start propranolol which is also for hypertension -Avoid Tylenol -Protonix  Hypokalemia: K= 3.0 on admission. - Repleted - Check Mg level  HTN: bp 141/107. Not taking meds at home -started propranolol as above -IV hydralazine when necessary  GERD: -Protonix  Polysubstance abuse: Including alcohol, tobacco, marijuana. Patient states that he did not drink alcohol in the past 7 days. Currently no signs of withdrawal. -Due to counseling about importance of quitting substance. -Nicotine patch -Check UDS  Thrombocytopenia (HCC): Previously 60 which was a 69 on 11/01/15. No active bleeding. Most likely due to liver cirrhosis and splenomegaly. -Follow-up CBC     DVT ppx: SCD Code Status: Full code Family Communication: None at bed side.   Disposition Plan:  Anticipate discharge back to previous home environment Consults called:  GI, Dr. Christella HartiganJacobs Admission status: Inpatient/tele   Date of Service 11/07/2015    Lorretta HarpNIU, Shakevia Sarris [facility] Pager [phone]  If 7PM-7AM, please contact night-coverage [email or web address] Password [email or web address] 11/07/2015, 7:00 AM

## 2015-11-07 NOTE — ED Notes (Signed)
MD at bedside. 

## 2015-11-07 NOTE — Consult Note (Signed)
Consultation  Referring Provider:  Dr. Wynetta Emery    Primary Care Physician:  Scarlette Calico, MD Primary Gastroenterologist: Althia Forts   Reason for Consultation: abdominal pain, elevated LFT's, dilated CBD             HPI:   Jacob Hill is a 45 y.o. male with a past medical history significant for polysubstance abuse including alcohol, tobacco and marijuana, hypertension, COPD, GERD, liver cirrhosis, thrombocytopenia and neuropathy, who presented to the ER this morning with a complaint of abdominal pain.  Patient was previously seen in the [facility] ED on 11/01/15 and had an ultrasound of the abdomen which showed no evidence of cholelithiasis or cholecystitis and also no ascites, Though it was concerning for a cirrhotic-appearing liver without suspicion of focal lesion..Labs at that time showed an elevated lipase 55, AST of 56, total bilirubin of 2.5 and platelets of 69.  Today, the patient tells me that 9 days ago he started with severe right upper quadrant pain, though he had been having "twinges of pain off and on in this area for months", this was so bad that he took an ambulance to the ER. He tells me that at that time he had an ultrasound which was normal and was given pain meds to take home. He made those last until about 2 days ago when he no longer had any more and started again with the same pain. This increased until last night when it was to the point that he "couldn't stand it anymore". He then proceeded back to the ER and had a CT scan and was admitted. Patient tells me this right upper quadrant pain is constant and "gnawing/nagging", with "sharp twinges which radiate through to his back". He tells me that nothing seems to make this any worse and only pain medicine helps it. He denies any nausea, vomiting or change in bowel habits.  Patient also has a history of reflux for which he was on prescription medicine until a year ago when he could "np longer afford this", he started taking  "[employer] brand". He tells me that he has constant heartburn and reflux.  Patient also admits to an increase in urinary frequency and amount over the past 10 days or so.  Patient's social history is positive for polysubstance abuse as above, though he tells me he has not had a drink for the past 7 days. Drug test shows positive for opiates and cocaine.  Patient's medical history is positive for diagnosis of cirrhosis around 2-3 years ago. The patient does not follow with anyone chronically.  Patient denies fever, chills, blood in the stool, melena, dizziness or syncope.  ED course: Patient found to have temp of 99.2, tachycardic, potassium 7, creatinine normal, negative urinalysis, lipase 61; CT abdomen pelvis showed no dilation of the common bile duct 1.2 cm, with minimal intrahepatic biliary ductal dilatation. Wall thickening noted with regard to the gallbladder. Mild surrounding soft tissue inflammation, raising question for mild cholecystitis. Gastric and splenic varices noted, one of the gastric varices is significantly enlarged, extending into the wall of the gastric fundus. Mildly nodular contour to the liver concerning for hepatic cirrhosis. Nonspecific small hypodensities within the liver measuring up to 1 cm in size. Splenomegaly. Scattered calcifications along the abdominal aorta and its branches.  No past GI history  Past Medical History:  Diagnosis Date  . Alcohol abuse   . Cirrhosis ([place])   . COPD (chronic obstructive pulmonary disease) ([place])   . DJD (  degenerative joint disease)   . GERD (gastroesophageal reflux disease)   . Hypertension   . Marijuana abuse   . Neuropathy ([place])   . Osteoarthritis   . Seizure disorder ([place])   . Seizures ([place])   . Tobacco abuse     Past Surgical History:  Procedure Laterality Date  . HEMORRHOID SURGERY      Family History  Problem Relation Age of Onset  . Diabetes Other   . Hypertension Other   . Diabetes Father      Social  History  Substance Use Topics  . Smoking status: Current Every Day Smoker    Packs/day: 0.50    Years: 15.00    Types: Cigarettes  . Smokeless tobacco: Never Used  . Alcohol use 5.4 oz/week    9 Cans of beer per week     Comment: No beer in 7 days    Prior to Admission medications   Medication Sig Start Date End Date Taking? Authorizing Provider  acetaminophen (TYLENOL) 500 MG tablet Take 500 mg by mouth every 6 (six) hours as needed for mild pain, moderate pain or headache.    Yes Historical Provider, MD  ferrous sulfate 325 (65 FE) MG tablet Take 325 mg by mouth daily with breakfast.   Yes Historical Provider, MD  potassium chloride SA (K-DUR,KLOR-CON) 20 MEQ tablet Take 20 mEq by mouth 2 (two) times daily.   Yes Historical Provider, MD    Current Facility-Administered Medications  Medication Dose Route Frequency Provider Last Rate Last Dose  . dextrose 5 % and 0.9 % NaCl with KCl 20 mEq/L infusion   Intravenous Continuous Clanford Marisa Hua, MD 50 mL/hr at 11/07/15 0850    . hydrALAZINE (APRESOLINE) injection 5 mg  5 mg Intravenous Q2H PRN Ivor Costa, MD      . HYDROmorphone (DILAUDID) injection 1 mg  1 mg Intravenous Q4H PRN Ivor Costa, MD   1 mg at 11/07/15 0617  . [START ON 11/08/2015] Influenza vac split quadrivalent PF (FLUARIX) injection 0.5 mL  0.5 mL Intramuscular Tomorrow-1000 Ivor Costa, MD      . magnesium sulfate IVPB 2 g 50 mL  2 g Intravenous Once Clanford Marisa Hua, MD   2 g at 11/07/15 0850  . MEDLINE mouth rinse  15 mL Mouth Rinse BID Ivor Costa, MD      . nicotine (NICODERM CQ - dosed in mg/24 hours) patch 21 mg  21 mg Transdermal Daily Ivor Costa, MD      . ondansetron [facility]) injection 4 mg  4 mg Intravenous Q8H PRN Ivor Costa, MD      . oxyCODONE (Oxy IR/ROXICODONE) immediate release tablet 5 mg  5 mg Oral Q4H PRN Ivor Costa, MD   5 mg at 11/07/15 3419  . pantoprazole (PROTONIX) EC tablet 40 mg  40 mg Oral Q1200 Ivor Costa, MD      . piperacillin-tazobactam (ZOSYN) IVPB  3.375 g  3.375 g Intravenous Q8H Ivor Costa, MD   3.375 g at 11/07/15 0617  . propranolol ER (INDERAL LA) 24 hr capsule 60 mg  60 mg Oral Daily Ivor Costa, MD      . sodium chloride flush (NS) 0.9 % injection 3 mL  3 mL Intravenous Q12H Ivor Costa, MD        Allergies as of 11/06/2015 - Review Complete 11/01/2015  Allergen Reaction Noted  . Codeine  12/23/2007  . Nsaids  12/23/2007     Review of Systems:    Constitutional: Positive  for poor appetite and fatigue No weight loss, fever, chills, weakness  HEENT: Eyes: No Change in vision               Ears, Nose, Throat:  No change in hearing or congestion Skin: No rash or itching Cardiovascular: No chest pain or palpitations Respiratory: No SOB or cough Gastrointestinal: See HPI and otherwise negative Genitourinary: Positive for increase in urinary frequency No dysuria or hematuria Neurological: No headache, dizziness or syncope Musculoskeletal: No new muscle or joint pain Hematologic: No bleeding Psychiatric: No history of depression or anxiety   Physical Exam:  Vital signs in last 24 hours: Temp:  [97.7 F (36.5 C)-99.2 F (37.3 C)] 97.7 F (36.5 C) (09/19 0508) Pulse Rate:  [67-129] 67 (09/19 0508) Resp:  [18-20] 18 (09/19 0508) BP: (141-157)/(91-112) 157/91 (09/19 0508) SpO2:  [99 %-100 %] 99 % (09/19 0508) Weight:  [168 lb 14.4 oz (76.6 kg)-168 lb 14.7 oz (76.6 kg)] 168 lb 14.7 oz (76.6 kg) (09/19 0722) Last BM Date: 11/06/15 General:  Caucasian male appears to be in NAD, Well developed, Well nourished, alert and cooperative Head:  Normocephalic and atraumatic. Eyes:   PEERL, EOMI. No icterus. Conjunctiva pink. Ears:  Normal auditory acuity. Neck:  Supple Throat: Oral cavity and pharynx without inflammation, swelling or lesion.  Lungs: Respirations even and unlabored. Lungs clear to auscultation bilaterally.   No wheezes, crackles, or rhonchi.  Heart: Normal S1, S2. No MRG. Regular rate and rhythm. No peripheral edema,  cyanosis or pallor.  Abdomen:  Soft, nondistended, marked TTP in the right upper quadrant with some involuntary guarding.Normal bowel sounds. No appreciable masses or hepatomegaly. Rectal:  Not performed.  Msk:  Symmetrical without gross deformities. Extremities:  Without edema, no deformity or joint abnormality.  Neurologic:  Alert and  oriented x4;  grossly normal neurologically.  Skin:   Dry and intact without significant lesions or rashes. Psychiatric: Oriented to person, place and time. Demonstrates good judgement and reason without abnormal affect or behaviors.   LAB RESULTS:  Recent Labs  11/07/15 0100  WBC 6.1  HGB 13.9  HCT 38.7*  PLT 60*   BMET  Recent Labs  11/07/15 0100  NA 138  K 3.0*  CL 104  CO2 27  GLUCOSE 142*  BUN 8  CREATININE 0.92  CALCIUM 9.8   LFT  Recent Labs  11/07/15 0100  PROT 7.4  ALBUMIN 3.2*  AST 61*  ALT 30  ALKPHOS 58  BILITOT 2.9*   PT/INR  Recent Labs  11/07/15 0602  LABPROT 17.8*  INR 1.45    STUDIES: Ct Abdomen Pelvis W Wo Contrast  Result Date: 11/07/2015 CLINICAL DATA:  Acute onset of right upper quadrant abdominal pain. Increased urinary frequency. Chronic hepatic cirrhosis. Initial encounter. EXAM: CT ABDOMEN AND PELVIS WITHOUT AND WITH CONTRAST TECHNIQUE: Multidetector CT imaging of the abdomen and pelvis was performed following the standard protocol before and following the bolus administration of intravenous contrast. CONTRAST:  136m ISOVUE-300 IOPAMIDOL (ISOVUE-300) INJECTION 61% COMPARISON:  CT of the abdomen and pelvis from 02/10/2012, and abdominal ultrasound performed 11/01/2015 FINDINGS: Lower chest: The visualized lung bases are grossly clear. The visualized portions of the mediastinum are unremarkable. Hepatobiliary: There is a mildly nodular contour to the liver, concerning for hepatic cirrhosis. Small nonspecific hypodensities are noted within the liver, measuring up to 1.0 cm in size. Gastric and splenic  varices are noted. One of the gastric varices is significantly enlarged, extending into the wall of the gastric fundus.  Wall thickening is noted with regard to the gallbladder, with mild surrounding soft tissue inflammation. There is dilatation of the common bile duct to 1.2 cm, new from the prior study, with minimal intrahepatic biliary ductal dilatation. Pancreas: The pancreas is within normal limits. Spleen: The spleen is enlarged, measuring 14.1 cm in length. Adrenals/Urinary Tract: The adrenal glands are unremarkable in appearance. The kidneys are within normal limits. There is no evidence of hydronephrosis. No renal or ureteral stones are identified. No perinephric stranding is seen. Stomach/Bowel: The stomach is unremarkable in appearance. The small bowel is within normal limits. The appendix is normal in caliber, without evidence of appendicitis. The colon is unremarkable in appearance. Vascular/Lymphatic: Scattered calcification is seen along the abdominal aorta and its branches. The abdominal aorta is otherwise grossly unremarkable. The inferior vena cava is grossly unremarkable. No retroperitoneal lymphadenopathy is seen. No pelvic sidewall lymphadenopathy is identified. Reproductive: The bladder is moderately distended and within normal limits. The uterus is grossly unremarkable in appearance. The ovaries are relatively symmetric. No suspicious adnexal masses are seen. Other: No additional soft tissue abnormalities are seen. Musculoskeletal: No acute osseous abnormalities are identified. Vacuum phenomenon is noted at the lower lumbar spine. The visualized musculature is unremarkable in appearance. IMPRESSION: 1. New dilatation of the common bile duct to 1.2 cm, with minimal intrahepatic biliary ductal dilatation. Wall thickening noted with regard to the gallbladder, with mild surrounding soft tissue inflammation, raising question for mild cholecystitis. ERCP or MRCP could be considered for further  evaluation, to assess for underlying stone. 2. Gastric and splenic varices noted. One of the gastric varices is significantly enlarged, extending into the wall of the gastric fundus. 3. Mildly nodular contour to the liver is concerning for hepatic cirrhosis. Nonspecific small hypodensities within the liver measure up to 1.0 cm in size. 4. Splenomegaly. 5. Scattered calcification along the abdominal aorta and its branches. Electronically Signed   By: Garald Balding M.D.   On: 11/07/2015 02:44     PREVIOUS ENDOSCOPIES:            None   Impression / Plan:   Impression: 1. Abnormal CT of the abdomen and pelvis: As above showing new dilation of the common bile duct 1.2 cm with minimal intrahepatic biliary ductal dilatation, wall thickening noted with regard to the gallbladder and mild surrounding soft tissue inflammation; consider choledocholithiasis+/-cholecystitis 2. Right upper quadrant abdominal pain: Likely related to above 3. Elevated LFT's: AST 61, ALT nl at 30, Alk phos nl at 58,Tot Bili elevated at 2.9 from 2.5 on 9/13 4. Alcoholic cirrhosis of the liver with gastric varices: Seen on recent CT 5. Hypokalemia: 3.0 on admission, being repleted 6. GERD: Long history, patient has been off of prescription medicines for the past year and has been using "[employer] brand", continues with frequent heartburn and reflux 7. Polysubstance abuse: Including alcohol, tobacco and marijuana, patient denies alcohol use in the past 7 days. Drug test positive for cocaine and opiates on 11/07/15. 8. Thrombocytopenia: Previously 60, 69 on 11/01/15 likely due to liver cirrhosis and splenomegaly  Plan: 1. Will discuss with Dr. Loletha Carrow needs for MRCP vs ERCP. 2. Plan for EGD tomorrow for further eval of varices 3. Agree with clears today and NPO after midnight 4. Continue supportive measures including pain medicines 5. Will order AM tox screen, if still positive will cancer EGD 6. Will discuss above with Dr.  Loletha Carrow, please await any further recommendations  Thank you for your kind consultation, we will continue  to follow.  Lavone Nian [facility]  11/07/2015, 9:39 AM Pager #: [phone]

## 2015-11-07 NOTE — Progress Notes (Signed)
11/07/2015 12:30 AM  11/07/2015 8:35 AM  Jacob Hill was seen and examined.  Jacob Hill is a 45 y.o. male with medical history significant of polysubstance abuse including alcohol, tobacco and marijuana, hypertension, COPD, GERD, liver cirrhosis, thrombocytopenia, neuropathy, who presents with abdominal pain.  The H&P by the admitting provider , orders, imaging was reviewed.  Please see orders.  Will continue to follow.   Vitals:   11/06/15 2358 11/07/15 0508  BP: (!) 141/107 (!) 157/91  Pulse:  67  Resp:  18  Temp:  97.7 F (36.5 C)   Results for orders placed or performed during the hospital encounter of 11/07/15  CBC with Differential  Result Value Ref Range   WBC 6.1 4.0 - 10.5 K/uL   RBC 4.02 (L) 4.22 - 5.81 MIL/uL   Hemoglobin 13.9 13.0 - 17.0 g/dL   HCT 19.138.7 (L) 47.839.0 - 29.552.0 %   MCV 96.3 78.0 - 100.0 fL   MCH 34.6 (H) 26.0 - 34.0 pg   MCHC 35.9 30.0 - 36.0 g/dL   RDW 62.113.5 30.811.5 - 65.715.5 %   Platelets 60 (L) 150 - 400 K/uL   Neutrophils Relative % 54 %   Lymphocytes Relative 27 %   Monocytes Relative 15 %   Eosinophils Relative 3 %   Basophils Relative 1 %   Neutro Abs 3.3 1.7 - 7.7 K/uL   Lymphs Abs 1.6 0.7 - 4.0 K/uL   Monocytes Absolute 0.9 0.1 - 1.0 K/uL   Eosinophils Absolute 0.2 0.0 - 0.7 K/uL   Basophils Absolute 0.1 0.0 - 0.1 K/uL   Smear Review LARGE PLATELETS PRESENT   Comprehensive metabolic panel  Result Value Ref Range   Sodium 138 135 - 145 mmol/L   Potassium 3.0 (L) 3.5 - 5.1 mmol/L   Chloride 104 101 - 111 mmol/L   CO2 27 22 - 32 mmol/L   Glucose, Bld 142 (H) 65 - 99 mg/dL   BUN 8 6 - 20 mg/dL   Creatinine, Ser 8.460.92 0.61 - 1.24 mg/dL   Calcium 9.8 8.9 - 96.210.3 mg/dL   Total Protein 7.4 6.5 - 8.1 g/dL   Albumin 3.2 (L) 3.5 - 5.0 g/dL   AST 61 (H) 15 - 41 U/L   ALT 30 17 - 63 U/L   Alkaline Phosphatase 58 38 - 126 U/L   Total Bilirubin 2.9 (H) 0.3 - 1.2 mg/dL   GFR calc non Af Amer >60 >60 mL/min   GFR calc Af Amer >60 >60 mL/min   Anion gap 7 5 - 15  Lipase, blood  Result Value Ref Range   Lipase 61 (H) 11 - 51 U/L  Urinalysis, Routine w reflex microscopic ([facility])  Result Value Ref Range   Color, Urine YELLOW YELLOW   APPearance CLEAR CLEAR   Specific Gravity, Urine 1.009 1.005 - 1.030   pH 7.0 5.0 - 8.0   Glucose, UA NEGATIVE NEGATIVE mg/dL   Hgb urine dipstick NEGATIVE NEGATIVE   Bilirubin Urine NEGATIVE NEGATIVE   Ketones, ur NEGATIVE NEGATIVE mg/dL   Protein, ur NEGATIVE NEGATIVE mg/dL   Nitrite NEGATIVE NEGATIVE   Leukocytes, UA NEGATIVE NEGATIVE  Magnesium  Result Value Ref Range   Magnesium 1.4 (L) 1.7 - 2.4 mg/dL  Lactic acid, plasma  Result Value Ref Range   Lactic Acid, Venous 1.2 0.5 - 1.9 mmol/L  Urine rapid drug screen (hosp performed)  Result Value Ref Range   Opiates POSITIVE (A) NONE DETECTED   Cocaine  POSITIVE (A) NONE DETECTED   Benzodiazepines NONE DETECTED NONE DETECTED   Amphetamines NONE DETECTED NONE DETECTED   Tetrahydrocannabinol NONE DETECTED NONE DETECTED   Barbiturates NONE DETECTED NONE DETECTED  Protime-INR  Result Value Ref Range   Prothrombin Time 17.8 (H) 11.4 - 15.2 seconds   INR 1.45   APTT  Result Value Ref Range   aPTT 36 24 - 36 seconds  Glucose, capillary  Result Value Ref Range   Glucose-Capillary 77 65 - 99 mg/dL  Type and screen [facility] [place] [facility]  Result Value Ref Range   ABO/RH(D) B POS    Antibody Screen NEG    Sample Expiration 11/10/2015   ABO/Rh  Result Value Ref Range   ABO/RH(D) B Vinnie Level, MD [facility]

## 2015-11-07 NOTE — ED Provider Notes (Signed)
[facility] Provider Note   CSN: [id number] Arrival date & time: 11/06/15  2338   By signing my name below, I, Jacob Hill, attest that this documentation has been prepared under the direction and in the presence of [name], MD . Electronically Signed: Christel Hill, Scribe. 11/07/2015. 12:48 AM.   History   Chief Complaint Chief Complaint  Patient presents with  . Abdominal Pain    The history is provided by the patient. No language interpreter was used.   HPI Comments:  Jacob Hill is a 45 y.o. male with PMHx of COPD, HTN and kidney stones who presents to the Emergency Department complaining of constant RUQ pain x9 days. Pt states that last week the pain began during a Bm. Pt was seen at [facility] 9 days ago for the same. Pt describes the pain as "gnawing" "nagging" "sharp" and "shooting." Pt rates the pain at 9/10 and states that it radiates through to his back. Pt states that the pain is exacerbated by moving. Pt complains of associated urinary frequency. Pt notes that this episode of pain is different than the pain associated with his previous kidney stones. Pt denies hematuria, dysuria, nausea, vomiting, diarrhea.   During recent hospital visit, patient had an ultrasound that was negative for cholecystitis. He does have a history of cirrhosis. Wife states that he has not had an alcoholic drink in one week. Previously had been a daily drinker.  Past Medical History:  Diagnosis Date  . Cirrhosis (HCC)   . COPD (chronic obstructive pulmonary disease) (HCC)   . DJD (degenerative joint disease)   . GERD (gastroesophageal reflux disease)   . Hypertension   . Neuropathy (HCC)   . Osteoarthritis   . Seizure disorder (HCC)   . Seizures [facility])     Patient Active Problem List   Diagnosis Date Noted  . GAD (generalized anxiety disorder) 05/11/2014  . Pernicious anemia 11/03/2013  . Essential hypertension, benign 06/30/2012  . Alcoholic cirrhosis of  liver (HCC) 02/05/2012  . Vomiting 02/05/2012  . Routine general medical examination at a health care facility 09/09/2011  . DJD (degenerative joint disease), ankle and foot 11/08/2010  . PERIPHERAL NEUROPATHY 01/06/2009  . COPD 12/23/2007  . GERD 12/23/2007  . KIDNEY STONE 12/23/2007  . Convulsions (HCC) 12/23/2007    History reviewed. No pertinent surgical history.     Home Medications    Prior to Admission medications   Medication Sig Start Date End Date Taking? Authorizing Provider  acetaminophen (TYLENOL) 500 MG tablet Take 500 mg by mouth every 6 (six) hours as needed for mild pain.    Historical Provider, MD  IRON PO Take 1 tablet by mouth daily.    Historical Provider, MD  oxyCODONE (ROXICODONE) 5 MG immediate release tablet Take 1 tablet (5 mg total) by mouth every 4 (four) hours as needed for severe pain. 11/01/15   [name], MD  potassium chloride SA (KLOR-CON M20) 20 MEQ tablet take 1 tablet by mouth twice a day 11/01/15   [name], MD    Family History Family History  Problem Relation Age of Onset  . Diabetes Other   . Hypertension Other   . Diabetes Father     Social History Social History  Substance Use Topics  . Smoking status: Current Every Day Smoker    Packs/day: 0.50    Years: 15.00    Types: Cigarettes  . Smokeless tobacco: Never Used  . Alcohol use 5.4 oz/week  9 Cans of beer per week     Comment: No beer in 7 days     Allergies   Codeine and Nsaids   Review of Systems Review of Systems  Constitutional: Negative for fever.  Respiratory: Negative for shortness of breath.   Cardiovascular: Negative for chest pain.  Gastrointestinal: Positive for abdominal pain. Negative for diarrhea, nausea and vomiting.  Genitourinary: Positive for frequency. Negative for dysuria and hematuria.  Musculoskeletal: Positive for back pain.  All other systems reviewed and are negative.    Physical Exam Updated Vital Signs BP (!) 141/107 (BP  Location: Left Arm)   Pulse (!) 129   Temp 99.2 F (37.3 C) (Oral)   Resp 20   Ht 6' (1.829 m)   Wt 168 lb 14.4 oz (76.6 kg)   SpO2 100%   BMI 22.91 kg/m   Physical Exam  Constitutional: He is oriented to person, place, and time.  Smells of smoke, uncomfortable appearing  HENT:  Head: Normocephalic and atraumatic.  Cardiovascular: Normal rate, regular rhythm and normal heart sounds.   No murmur heard. Pulmonary/Chest: Effort normal and breath sounds normal. No respiratory distress. He has no wheezes.  Abdominal: Soft. Bowel sounds are normal. There is tenderness. There is no rebound.  Right upper quadrant right flank tenderness to palpation, no rebound or guarding  Musculoskeletal: He exhibits no edema.  Lymphadenopathy:    He has no cervical adenopathy.  Neurological: He is alert and oriented to person, place, and time.  Skin: Skin is warm and dry.  Psychiatric: He has a normal mood and affect.  Nursing note and vitals reviewed.    ED Treatments / Results  DIAGNOSTIC STUDIES:  Oxygen Saturation is 100% on RA, normal by my interpretation.    COORDINATION OF CARE:  12:48 AM Discussed treatment plan with pt at bedside and pt agreed to plan.   Labs (all labs ordered are listed, but only abnormal results are displayed) Labs Reviewed  CBC WITH DIFFERENTIAL/PLATELET - Abnormal; Notable for the following:       Result Value   RBC 4.02 (*)    HCT 38.7 (*)    MCH 34.6 (*)    Platelets 60 (*)    All other components within normal limits  COMPREHENSIVE METABOLIC PANEL - Abnormal; Notable for the following:    Potassium 3.0 (*)    Glucose, Bld 142 (*)    Albumin 3.2 (*)    AST 61 (*)    Total Bilirubin 2.9 (*)    All other components within normal limits  LIPASE, BLOOD - Abnormal; Notable for the following:    Lipase 61 (*)    All other components within normal limits  URINALYSIS, ROUTINE W REFLEX MICROSCOPIC (NOT [facility])    EKG  EKG Interpretation None        Radiology Ct Abdomen Pelvis W Wo Contrast  Result Date: 11/07/2015 CLINICAL DATA:  Acute onset of right upper quadrant abdominal pain. Increased urinary frequency. Chronic hepatic cirrhosis. Initial encounter. EXAM: CT ABDOMEN AND PELVIS WITHOUT AND WITH CONTRAST TECHNIQUE: Multidetector CT imaging of the abdomen and pelvis was performed following the standard protocol before and following the bolus administration of intravenous contrast. CONTRAST:  100mL ISOVUE-300 IOPAMIDOL (ISOVUE-300) INJECTION 61% COMPARISON:  CT of the abdomen and pelvis from 02/10/2012, and abdominal ultrasound performed 11/01/2015 FINDINGS: Lower chest: The visualized lung bases are grossly clear. The visualized portions of the mediastinum are unremarkable. Hepatobiliary: There is a mildly nodular contour to the liver,  concerning for hepatic cirrhosis. Small nonspecific hypodensities are noted within the liver, measuring up to 1.0 cm in size. Gastric and splenic varices are noted. One of the gastric varices is significantly enlarged, extending into the wall of the gastric fundus. Wall thickening is noted with regard to the gallbladder, with mild surrounding soft tissue inflammation. There is dilatation of the common bile duct to 1.2 cm, new from the prior study, with minimal intrahepatic biliary ductal dilatation. Pancreas: The pancreas is within normal limits. Spleen: The spleen is enlarged, measuring 14.1 cm in length. Adrenals/Urinary Tract: The adrenal glands are unremarkable in appearance. The kidneys are within normal limits. There is no evidence of hydronephrosis. No renal or ureteral stones are identified. No perinephric stranding is seen. Stomach/Bowel: The stomach is unremarkable in appearance. The small bowel is within normal limits. The appendix is normal in caliber, without evidence of appendicitis. The colon is unremarkable in appearance. Vascular/Lymphatic: Scattered calcification is seen along the abdominal aorta and  its branches. The abdominal aorta is otherwise grossly unremarkable. The inferior vena cava is grossly unremarkable. No retroperitoneal lymphadenopathy is seen. No pelvic sidewall lymphadenopathy is identified. Reproductive: The bladder is moderately distended and within normal limits. The uterus is grossly unremarkable in appearance. The ovaries are relatively symmetric. No suspicious adnexal masses are seen. Other: No additional soft tissue abnormalities are seen. Musculoskeletal: No acute osseous abnormalities are identified. Vacuum phenomenon is noted at the lower lumbar spine. The visualized musculature is unremarkable in appearance. IMPRESSION: 1. New dilatation of the common bile duct to 1.2 cm, with minimal intrahepatic biliary ductal dilatation. Wall thickening noted with regard to the gallbladder, with mild surrounding soft tissue inflammation, raising question for mild cholecystitis. ERCP or MRCP could be considered for further evaluation, to assess for underlying stone. 2. Gastric and splenic varices noted. One of the gastric varices is significantly enlarged, extending into the wall of the gastric fundus. 3. Mildly nodular contour to the liver is concerning for hepatic cirrhosis. Nonspecific small hypodensities within the liver measure up to 1.0 cm in size. 4. Splenomegaly. 5. Scattered calcification along the abdominal aorta and its branches. Electronically Signed   By: Roanna Raider M.D.   On: 11/07/2015 02:44    Procedures Procedures (including critical care time)  Medications Ordered in ED Medications  0.9 %  sodium chloride infusion (not administered)  morphine 4 MG/ML injection 4 mg (4 mg Intravenous Given 11/07/15 0108)  ondansetron (ZOFRAN) injection 4 mg (4 mg Intravenous Given 11/07/15 0108)  sodium chloride 0.9 % bolus 1,000 mL (0 mLs Intravenous Stopped 11/07/15 0247)  iopamidol (ISOVUE-300) 61 % injection 100 mL (100 mLs Intravenous Contrast Given 11/07/15 0204)  HYDROmorphone  (DILAUDID) injection 1 mg (1 mg Intravenous Given 11/07/15 0308)     Initial Impression / Assessment and Plan / ED Course  I have reviewed the triage vital signs and the nursing notes.  Pertinent labs & imaging results that were available during my care of the patient were reviewed by me and considered in my medical decision making (see chart for details).  Clinical Course    Patient presents with persistent right upper quadrant pain. He is nontoxic. Vital signs notable for tachycardia to 129. He is tender to palpation to the peritonitis.  Patient was given pain and nausea medication. He was also given fluids. Lab work is unchanged from prior. Given persistent pain, CT scan was obtained.  CT shows no common bile duct dilation and gallbladder wall thickening. Question cholecystitis. Question need for  MRCP.    Patient was redosed pain medication. Discussed with Dr. Christella Hartigan, [facility].  Patient will need transfer to [facility] for further evaluation. He is currently nothing by mouth.  Dr. Christella Hartigan requests phone call upon patient arrival to [facility] long.  Discussed with hospitalist.   Final Clinical Impressions(s) / ED Diagnoses   Final diagnoses:  RUQ pain  Common bile duct dilation    New Prescriptions New Prescriptions   No medications on file   I personally performed the services described in this documentation, which was scribed in my presence. The recorded information has been reviewed and is accurate.     [name], MD 11/07/15 [phone]

## 2015-11-08 ENCOUNTER — Inpatient Hospital Stay (HOSPITAL_COMMUNITY): Payer: Self-pay | Admitting: Certified Registered Nurse Anesthetist

## 2015-11-08 ENCOUNTER — Encounter (HOSPITAL_COMMUNITY)
Admission: EM | Disposition: A | Discharge status: Home / Self Care | Payer: Self-pay | Source: Home / Self Care | Attending: Internal Medicine

## 2015-11-08 ENCOUNTER — Encounter (HOSPITAL_COMMUNITY): Payer: Self-pay

## 2015-11-08 DIAGNOSIS — K3189 Other diseases of stomach and duodenum: Secondary | ICD-10-CM

## 2015-11-08 DIAGNOSIS — F1010 Alcohol abuse, uncomplicated: Secondary | ICD-10-CM

## 2015-11-08 DIAGNOSIS — D696 Thrombocytopenia, unspecified: Secondary | ICD-10-CM

## 2015-11-08 DIAGNOSIS — K766 Portal hypertension: Secondary | ICD-10-CM

## 2015-11-08 DIAGNOSIS — I8510 Secondary esophageal varices without bleeding: Secondary | ICD-10-CM

## 2015-11-08 DIAGNOSIS — I864 Gastric varices: Secondary | ICD-10-CM

## 2015-11-08 DIAGNOSIS — I8500 Esophageal varices without bleeding: Secondary | ICD-10-CM

## 2015-11-08 DIAGNOSIS — R1011 Right upper quadrant pain: Secondary | ICD-10-CM

## 2015-11-08 DIAGNOSIS — K210 Gastro-esophageal reflux disease with esophagitis: Secondary | ICD-10-CM

## 2015-11-08 HISTORY — PX: ESOPHAGOGASTRODUODENOSCOPY (EGD) WITH PROPOFOL: SHX5813

## 2015-11-08 LAB — COMPREHENSIVE METABOLIC PANEL
ALT: 27 U/L (ref 17–63)
AST: 58 U/L — ABNORMAL HIGH (ref 15–41)
Albumin: 2.9 g/dL — ABNORMAL LOW (ref 3.5–5.0)
Alkaline Phosphatase: 49 U/L (ref 38–126)
Anion gap: 7 (ref 5–15)
BUN: 7 mg/dL (ref 6–20)
CO2: 22 mmol/L (ref 22–32)
Calcium: 8.4 mg/dL — ABNORMAL LOW (ref 8.9–10.3)
Chloride: 108 mmol/L (ref 101–111)
Creatinine, Ser: 1.03 mg/dL (ref 0.61–1.24)
GFR calc Af Amer: >60 mL/min (ref >60)
GFR calc non Af Amer: >60 mL/min (ref >60)
Glucose, Bld: 86 mg/dL (ref 65–99)
Potassium: 4.1 mmol/L (ref 3.5–5.1)
Sodium: 137 mmol/L (ref 135–145)
Total Bilirubin: 3.3 mg/dL — ABNORMAL HIGH (ref 0.3–1.2)
Total Protein: 6.8 g/dL (ref 6.5–8.1)

## 2015-11-08 LAB — RAPID URINE DRUG SCREEN, HOSP PERFORMED
Amphetamines: NOT DETECTED
Barbiturates: NOT DETECTED
Benzodiazepines: NOT DETECTED
Cocaine: NOT DETECTED
Opiates: POSITIVE — AB
Tetrahydrocannabinol: NOT DETECTED

## 2015-11-08 LAB — CBC
HCT: 37.7 % — ABNORMAL LOW (ref 39.0–52.0)
Hemoglobin: 12.9 g/dL — ABNORMAL LOW (ref 13.0–17.0)
MCH: 34.3 pg — ABNORMAL HIGH (ref 26.0–34.0)
MCHC: 34.2 g/dL (ref 30.0–36.0)
MCV: 100.3 fL — ABNORMAL HIGH (ref 78.0–100.0)
Platelets: 71 10*3/uL — ABNORMAL LOW (ref 150–400)
RBC: 3.76 MIL/uL — ABNORMAL LOW (ref 4.22–5.81)
RDW: 14.8 % (ref 11.5–15.5)
WBC: 7.3 10*3/uL (ref 4.0–10.5)

## 2015-11-08 LAB — GLUCOSE, CAPILLARY: Glucose-Capillary: 84 mg/dL (ref 65–99)

## 2015-11-08 LAB — MAGNESIUM: Magnesium: 1.5 mg/dL — ABNORMAL LOW (ref 1.7–2.4)

## 2015-11-08 SURGERY — ESOPHAGOGASTRODUODENOSCOPY (EGD) WITH PROPOFOL
Anesthesia: Monitor Anesthesia Care

## 2015-11-08 MED ORDER — KETAMINE HCL 10 MG/ML IJ SOLN
INTRAMUSCULAR | Status: AC
  Filled 2015-11-08: qty 1

## 2015-11-08 MED ORDER — MAGNESIUM SULFATE 4 GM/100ML IV SOLN
4.0000 g | Freq: Once | INTRAVENOUS | Status: AC
  Administered 2015-11-08: 4 g via INTRAVENOUS
  Filled 2015-11-08: qty 100

## 2015-11-08 MED ORDER — PHENYLEPHRINE 40 MCG/ML (10ML) SYRINGE FOR IV PUSH (FOR BLOOD PRESSURE SUPPORT)
PREFILLED_SYRINGE | INTRAVENOUS | Status: DC | PRN
  Administered 2015-11-08 (×2): 40 ug via INTRAVENOUS

## 2015-11-08 MED ORDER — DEXTROSE-NACL 5-0.45 % IV SOLN
INTRAVENOUS | Status: DC
  Administered 2015-11-08 – 2015-11-09 (×2): via INTRAVENOUS
  Administered 2015-11-09: 50 mL/h via INTRAVENOUS

## 2015-11-08 MED ORDER — PROPOFOL 500 MG/50ML IV EMUL
INTRAVENOUS | Status: DC | PRN
  Administered 2015-11-08: 200 ug/kg/min via INTRAVENOUS

## 2015-11-08 MED ORDER — GLYCOPYRROLATE 0.2 MG/ML IV SOSY
PREFILLED_SYRINGE | INTRAVENOUS | Status: AC
  Filled 2015-11-08: qty 3

## 2015-11-08 MED ORDER — PHENYLEPHRINE 40 MCG/ML (10ML) SYRINGE FOR IV PUSH (FOR BLOOD PRESSURE SUPPORT)
PREFILLED_SYRINGE | INTRAVENOUS | Status: AC
Start: 2015-11-08 — End: 2015-11-08
  Filled 2015-11-08: qty 10

## 2015-11-08 MED ORDER — GLYCOPYRROLATE 0.2 MG/ML IJ SOLN
INTRAMUSCULAR | Status: DC | PRN
  Administered 2015-11-08: 0.1 mg via INTRAVENOUS

## 2015-11-08 MED ORDER — LIDOCAINE 2% (20 MG/ML) 5 ML SYRINGE
INTRAMUSCULAR | Status: DC | PRN
  Administered 2015-11-08: 50 mg via INTRAVENOUS

## 2015-11-08 MED ORDER — LACTATED RINGERS IV SOLN
INTRAVENOUS | Status: DC | PRN
  Administered 2015-11-08: 13:00:00 via INTRAVENOUS

## 2015-11-08 MED ORDER — KETAMINE HCL 10 MG/ML IJ SOLN
INTRAMUSCULAR | Status: DC | PRN
  Administered 2015-11-08: 20 mg via INTRAVENOUS

## 2015-11-08 MED ORDER — MAGNESIUM SULFATE 2 GM/50ML IV SOLN: 2.0000 g | Freq: Once | INTRAVENOUS | Status: DC

## 2015-11-08 MED ORDER — PROPOFOL 10 MG/ML IV BOLUS
INTRAVENOUS | Status: AC
  Filled 2015-11-08: qty 40

## 2015-11-08 MED ORDER — PROPOFOL 10 MG/ML IV BOLUS
INTRAVENOUS | Status: DC | PRN
  Administered 2015-11-08: 50 mg via INTRAVENOUS

## 2015-11-08 MED ORDER — LIDOCAINE 2% (20 MG/ML) 5 ML SYRINGE
INTRAMUSCULAR | Status: AC
  Filled 2015-11-08: qty 5

## 2015-11-08 SURGICAL SUPPLY — 15 items

## 2015-11-08 NOTE — Anesthesia Postprocedure Evaluation (Signed)
Anesthesia Post Note  Patient: Jacob Hill  Procedure(s) Performed: Procedure(s) (LRB): ESOPHAGOGASTRODUODENOSCOPY (EGD) WITH PROPOFOL (N/A)  Patient location during evaluation: Endoscopy Anesthesia Type: MAC Level of consciousness: awake and alert Pain management: pain level controlled Vital Signs Assessment: post-procedure vital signs reviewed and stable Respiratory status: spontaneous breathing, nonlabored ventilation, respiratory function stable and patient connected to nasal cannula oxygen Cardiovascular status: stable and blood pressure returned to baseline Anesthetic complications: no    Last Vitals:  Vitals:   11/08/15 1440 11/08/15 1450  BP: 123/66 115/69  Pulse: (!) 51 (!) 57  Resp: 19 13  Temp:      Last Pain:  Vitals:   11/08/15 1314  TempSrc:   PainSc: 5                  Phillips Groutarignan, [name]

## 2015-11-08 NOTE — Op Note (Signed)
[facility] Patient Name: Jacob Hill Procedure Date: 11/08/2015 MRN: [id number] Attending MD: Rachael Fee , MD Date of Birth: 28-Nov-1970 CSN: [id number] Age: 45 Admit Type: Inpatient Procedure:                Upper GI endoscopy Indications:              Abdominal pain in the right upper quadrant,                            elevated liver tests (chronically), known cirrhosis Providers:                Rachael Fee, MD, Anthony Sar, RN, Lorenda Ishihara, Technician, Randon Goldsmith, CRNA Referring MD:              Medicines:                Monitored Anesthesia Care Complications:            No immediate complications. Estimated blood loss:                            None. Estimated Blood Loss:     Estimated blood loss: none. Procedure:                Pre-Anesthesia Assessment:                           - Prior to the procedure, a History and Physical                            was performed, and patient medications and                            allergies were reviewed. The patient's tolerance of                            previous anesthesia was also reviewed. The risks                            and benefits of the procedure and the sedation                            options and risks were discussed with the patient.                            All questions were answered, and informed consent                            was obtained. Prior Anticoagulants: The patient has                            taken no previous anticoagulant or antiplatelet  agents. ASA Grade Assessment: III - A patient with                            severe systemic disease. After reviewing the risks                            and benefits, the patient was deemed in                            satisfactory condition to undergo the procedure.                           After obtaining informed consent, the endoscope was         passed under direct vision. Throughout the                            procedure, the patient's blood pressure, pulse, and                            oxygen saturations were monitored continuously. The                            was introduced through the mouth, and advanced to                            the second part of duodenum. The upper GI endoscopy                            was accomplished without difficulty. The patient                            tolerated the procedure well. Scope In: Scope Out: Findings:      1. Grade I varices were found in the distal esophagus with mild       overlying reflux related esophagitis.      2. Moderate appearing portal hypertensive gastropathy throughout the       stomach. Distally this was more typical appearing non-specific       gastritis, biopsies were taken and sent to pathology (H. pylori?)      3. Tubular structure in very proximal stomach that probably represents a       gastric varix, without associated stigmata of recent bleeding.      4. Moderate duodenitis, non-specific.      5. The exam was otherwise without abnormality. Impression:               - Grade I esophageal varices.                           - Reflux esophagitis                           - Small proximal gastric varix                           - Portal gastropathy.                           -  Non-specific distal gastritis. Biopsied.                           - The examination was otherwise normal. Moderate Sedation:      N/A- Per Anesthesia Care Recommendation:           - Return patient to hospital ward for ongoing care.                           - Advance diet as tolerated.                           - Continue present medications.                           - These findings do not likely explain his abd                            pains. His liver tests have been elevated for                            several years (Tbili 2-4 over several years) and I                             suspect this is more a reflection of his baseline                            cirrhosis +/- acute etoh hepatitis than a separate                            biliary etiology. Given the CT reported CBD 1.2cm I                            will order MRI with MRCP images to get a more                            accurate biliary assessment than CT can provide. Procedure Code(s):        --- Professional ---                           [phone], Esophagogastroduodenoscopy, flexible,                            transoral; with biopsy, single or multiple Diagnosis Code(s):        --- Professional ---                           I85.00, Esophageal varices without bleeding                           K29.70, Gastritis, unspecified, without bleeding                           R10.11, Right upper quadrant pain CPT copyright 2016 [employer]. All rights  reserved. The codes documented in this report are preliminary and upon coder review may  be revised to meet current compliance requirements. Rachael Fee, MD 11/08/2015 2:06:02 PM This report has been signed electronically. Number of Addenda: 0

## 2015-11-08 NOTE — Anesthesia Preprocedure Evaluation (Addendum)
Anesthesia Evaluation  Patient identified by MRN, date of birth, ID band Patient awake    Reviewed: Allergy & Precautions, NPO status , Patient's Chart, lab work & pertinent test results  Airway Mallampati: II  TM Distance: >3 FB Neck ROM: Full    Dental no notable dental hx.    Pulmonary COPD, Current Smoker,    Pulmonary exam normal breath sounds clear to auscultation       Cardiovascular hypertension, Normal cardiovascular exam Rhythm:Regular Rate:Normal     Neuro/Psych Seizures - (EtOH withdrawl seizures),  negative neurological ROS  negative psych ROS   GI/Hepatic negative GI ROS, (+) Cirrhosis     substance abuse (opioid abuse)  alcohol use and marijuana use,   Endo/Other  negative endocrine ROS  Renal/GU negative Renal ROS  negative genitourinary   Musculoskeletal negative musculoskeletal ROS (+)   Abdominal   Peds negative pediatric ROS (+)  Hematology negative hematology ROS (+) thrombocytopenia   Anesthesia Other Findings   Reproductive/Obstetrics negative OB ROS                           Anesthesia Physical Anesthesia Plan  ASA: III  Anesthesia Plan: MAC   Post-op Pain Management:    Induction:   Airway Management Planned:   Additional Equipment:   Intra-op Plan:   Post-operative Plan:   Informed Consent: I have reviewed the patients History and Physical, chart, labs and discussed the procedure including the risks, benefits and alternatives for the proposed anesthesia with the patient or authorized representative who has indicated his/her understanding and acceptance.   Dental advisory given  Plan Discussed with: CRNA  Anesthesia Plan Comments:         Anesthesia Quick Evaluation

## 2015-11-08 NOTE — Transfer of Care (Signed)
Immediate Anesthesia Transfer of Care Note  Patient: Jacob Hill  Procedure(s) Performed: Procedure(s): ESOPHAGOGASTRODUODENOSCOPY (EGD) WITH PROPOFOL (N/A)  Patient Location: PACU  Anesthesia Type:MAC  Level of Consciousness: Patient easily awoken, sedated, comfortable, cooperative, following commands, responds to stimulation.   Airway & Oxygen Therapy: Patient spontaneously breathing, ventilating well, oxygen via simple oxygen mask.  Post-op Assessment: Report given to PACU RN, vital signs reviewed and stable, moving all extremities.   Post vital signs: Reviewed and stable.  Complications: No apparent anesthesia complications Last Vitals:  Vitals:   11/08/15 0525 11/08/15 1304  BP: 106/68 140/85  Pulse: 60 60  Resp: 18 13  Temp: 36.7 C     Last Pain:  Vitals:   11/08/15 1314  TempSrc:   PainSc: 5       Patients Stated Pain Goal: 2 (11/08/15 1247)  Complications: No apparent anesthesia complications

## 2015-11-08 NOTE — Progress Notes (Signed)
Spoke with pt concerning medications and PCP. Pt will need to call [facility] for an appointment. Will follow for medication assistant.

## 2015-11-08 NOTE — Progress Notes (Addendum)
PROGRESS NOTE  Jacob Hill  ZOX:[id number] DOB: 12/18/1970 DOA: 11/07/2015 PCP: Sanda Lingerhomas Jones, MD  Brief Narrative:    Jacob Hill is a 45 y.o. male with medical history significant of polysubstance abuse including alcohol, tobacco and marijuana, hypertension, COPD, GERD, liver cirrhosis, thrombocytopenia, neuropathy, who presents with abdominal pain.  CT abdomen/pelvis showed new dilatation of the common bile duct to 1.2 cm, with minimal intrahepatic biliary ductal dilatation,  Gallbladder wall thickening with mild surrounding soft tissue inflammation, possible cirrhosis with gastric and splenic varices and splenomegaly.  GI was consulted.  Patient underwent EGD on 9/20 which confirmed portal hypertensive gastropathy, grade 1 esophageal varices, reflux esophagitis, a nonspecific distal gastritis which was biopsied. Awaiting MRCP.  Assessment & Plan:   Principal Problem:   Abdominal pain Active Problems:   COPD (chronic obstructive pulmonary disease) (HCC)   Alcoholic cirrhosis of liver (HCC)   Hypokalemia   Essential hypertension, benign   GERD (gastroesophageal reflux disease)   Marijuana abuse   Alcohol abuse   Tobacco abuse   Thrombocytopenia (HCC)   Gastric varices   Common bile duct dilation   Chronic RUQ pain   LFT elevation   Cocaine abuse   Portal hypertensive gastropathy   Esophageal varices without bleeding (HCC)  Right upper quadrant abdominal pain: CT scan showed new dilatation of the common bile duct to 1.2 cm, with possible mild cholecystitis.  -  MRCP pending -  Continue Zosyn -  Appreciate gastroenterology assistance - Continue Protonix  Alcoholic cirrhosis of liver and Gastric varices: Patient likely has portal hypertension. Patient is at high risk of developing GI bleeding. -Started propranolol which is also for hypertension on 9/19 -Avoid Tylenol -Protonix  Hypokalemia: K= 3.0 on admission. - Repleted -Mg level low:  4gm IV magnesium  today  HTN: bp 141/107. Blood pressure improved -started propranolol as above -IV hydralazine when necessary  GERD: -Protonix  Polysubstance abuse: Including alcohol, tobacco, marijuana. Patient states that he did not drink alcohol in the past 7 days. Currently no signs of withdrawal. -Due to counseling about importance of quitting substance. -Nicotine patch -UDS positive for opiates  Thrombocytopenia (HCC): Previously 60 which was a 69 on 11/01/15. No active bleeding. Most likely due to liver cirrhosis and splenomegaly. -Follow-up CBC   DVT ppx: SCD Code Status: Full code Family Communication: None at bed side.   Disposition Plan:  Anticipate discharge back to previous home environment  Consultants:   Dr. Christella HartiganJacobs, gastroenterology  Procedures:  EGD on 9/20  Antimicrobials:   None    Subjective: Patient states he continues to have intermittent right upper quadrant pain. He also has some new left lower back pain after twisting and trying to pull himself in the bed. He denies nausea and vomiting. He has not had anything to eat or drink this morning due to upcoming procedure. Denies obvious blood in the stools, black tarry stools.  Objective: Vitals:   11/08/15 1430 11/08/15 1440 11/08/15 1450 11/08/15 1513  BP: 129/61 123/66 115/69 119/76  Pulse: (!) 49 (!) 51 (!) 57 (!) 56  Resp: 14 19 13 16   Temp:    98.3 F (36.8 C)  TempSrc:    Oral  SpO2: 96% 95% 94% 97%  Weight:      Height:        Intake/Output Summary (Last 24 hours) at 11/08/15 1801 Last data filed at 11/08/15 1700  Gross per 24 hour  Intake  500 ml  Output             1770 ml  Net            -1270 ml   Filed Weights   11/06/15 2358 11/07/15 0722  Weight: 76.6 kg (168 lb 14.4 oz) 76.6 kg (168 lb 14.7 oz)    Examination:  General exam:  Adult Male.  No acute distress.  HEENT:  NCAT, MMM Respiratory system: Clear to auscultation bilaterally Cardiovascular system: Regular rate and  rhythm, normal S1/S2. No murmurs, rubs, gallops or clicks.  Warm extremities Gastrointestinal system: Normal active bowel sounds, soft, nondistended.  Mild to moderate pain in RUQ with inspiration.  ABdomen otherwise soft and nontender.  MSK:  Normal tone and bulk, no lower extremity edema Neuro:  Grossly intact    Data Reviewed: I have personally reviewed following labs and imaging studies  CBC:  Recent Labs Lab 11/07/15 0100 11/08/15 0411  WBC 6.1 7.3  NEUTROABS 3.3  --   HGB 13.9 12.9*  HCT 38.7* 37.7*  MCV 96.3 100.3*  PLT 60* 71*   Basic Metabolic Panel:  Recent Labs Lab 11/07/15 0100 11/07/15 0602 11/08/15 0411  NA 138  --  137  K 3.0*  --  4.1  CL 104  --  108  CO2 27  --  22  GLUCOSE 142*  --  86  BUN 8  --  7  CREATININE 0.92  --  1.03  CALCIUM 9.8  --  8.4*  MG  --  1.4* 1.5*   GFR: Estimated Creatinine Clearance: 99.2 mL/min (by C-G formula based on SCr of 1.03 mg/dL). Liver Function Tests:  Recent Labs Lab 11/07/15 0100 11/08/15 0411  AST 61* 58*  ALT 30 27  ALKPHOS 58 49  BILITOT 2.9* 3.3*  PROT 7.4 6.8  ALBUMIN 3.2* 2.9*    Recent Labs Lab 11/07/15 0100  LIPASE 61*   No results for input(s): AMMONIA in the last 168 hours. Coagulation Profile:  Recent Labs Lab 11/07/15 0602  INR 1.45   Cardiac Enzymes: No results for input(s): CKTOTAL, CKMB, CKMBINDEX, TROPONINI in the last 168 hours. BNP (last 3 results) No results for input(s): PROBNP in the last 8760 hours. HbA1C: No results for input(s): HGBA1C in the last 72 hours. CBG:  Recent Labs Lab 11/07/15 0746 11/08/15 0753  GLUCAP 77 84   Lipid Profile: No results for input(s): CHOL, HDL, LDLCALC, TRIG, CHOLHDL, LDLDIRECT in the last 72 hours. Thyroid Function Tests: No results for input(s): TSH, T4TOTAL, FREET4, T3FREE, THYROIDAB in the last 72 hours. Anemia Panel: No results for input(s): VITAMINB12, FOLATE, FERRITIN, TIBC, IRON, RETICCTPCT in the last 72 hours. Urine  analysis:    Component Value Date/Time   COLORURINE YELLOW 11/07/2015 0110   APPEARANCEUR CLEAR 11/07/2015 0110   LABSPEC 1.009 11/07/2015 0110   PHURINE 7.0 11/07/2015 0110   GLUCOSEU NEGATIVE 11/07/2015 0110   GLUCOSEU 100 02/05/2012 0947   HGBUR NEGATIVE 11/07/2015 0110   BILIRUBINUR NEGATIVE 11/07/2015 0110   KETONESUR NEGATIVE 11/07/2015 0110   PROTEINUR NEGATIVE 11/07/2015 0110   UROBILINOGEN >=8.0 02/05/2012 0947   NITRITE NEGATIVE 11/07/2015 0110   LEUKOCYTESUR NEGATIVE 11/07/2015 0110   Sepsis Labs: @LABRCNTIP (procalcitonin:4,lacticidven:4)  )No results found for this or any previous visit (from the past 240 hour(s)).    Radiology Studies: Ct Abdomen Pelvis W Wo Contrast  Result Date: 11/07/2015 CLINICAL DATA:  Acute onset of right upper quadrant abdominal pain. Increased urinary frequency. Chronic hepatic cirrhosis. Initial  encounter. EXAM: CT ABDOMEN AND PELVIS WITHOUT AND WITH CONTRAST TECHNIQUE: Multidetector CT imaging of the abdomen and pelvis was performed following the standard protocol before and following the bolus administration of intravenous contrast. CONTRAST:  ISOVUE-300 IOPAMIDOL (ISOVUE-300) INJECTION 61% COMPARISON:  CT of the abdomen and pelvis from 02/10/2012, and abdominal ultrasound performed 11/01/2015 FINDINGS: Lower chest: The visualized lung bases are grossly clear. The visualized portions of the mediastinum are unremarkable. Hepatobiliary: There is a mildly nodular contour to the liver, concerning for hepatic cirrhosis. Small nonspecific hypodensities are noted within the liver, measuring up to 1.0 cm in size. Gastric and splenic varices are noted. One of the gastric varices is significantly enlarged, extending into the wall of the gastric fundus. Wall thickening is noted with regard to the gallbladder, with mild surrounding soft tissue inflammation. There is dilatation of the common bile duct to 1.2 cm, new from the prior study, with minimal  intrahepatic biliary ductal dilatation. Pancreas: The pancreas is within normal limits. Spleen: The spleen is enlarged, measuring 14.1 cm in length. Adrenals/Urinary Tract: The adrenal glands are unremarkable in appearance. The kidneys are within normal limits. There is no evidence of hydronephrosis. No renal or ureteral stones are identified. No perinephric stranding is seen. Stomach/Bowel: The stomach is unremarkable in appearance. The small bowel is within normal limits. The appendix is normal in caliber, without evidence of appendicitis. The colon is unremarkable in appearance. Vascular/Lymphatic: Scattered calcification is seen along the abdominal aorta and its branches. The abdominal aorta is otherwise grossly unremarkable. The inferior vena cava is grossly unremarkable. No retroperitoneal lymphadenopathy is seen. No pelvic sidewall lymphadenopathy is identified. Reproductive: The bladder is moderately distended and within normal limits. The uterus is grossly unremarkable in appearance. The ovaries are relatively symmetric. No suspicious adnexal masses are seen. Other: No additional soft tissue abnormalities are seen. Musculoskeletal: No acute osseous abnormalities are identified. Vacuum phenomenon is noted at the lower lumbar spine. The visualized musculature is unremarkable in appearance. IMPRESSION: 1. New dilatation of the common bile duct to 1.2 cm, with minimal intrahepatic biliary ductal dilatation. Wall thickening noted with regard to the gallbladder, with mild surrounding soft tissue inflammation, raising question for mild cholecystitis. ERCP or MRCP could be considered for further evaluation, to assess for underlying stone. 2. Gastric and splenic varices noted. One of the gastric varices is significantly enlarged, extending into the wall of the gastric fundus. 3. Mildly nodular contour to the liver is concerning for hepatic cirrhosis. Nonspecific small hypodensities within the liver measure up to 1.0  cm in size. 4. Splenomegaly. 5. Scattered calcification along the abdominal aorta and its branches. Electronically Signed   By: Roanna Raider M.D.   On: 11/07/2015 02:44     Scheduled Meds: . Influenza vac split quadrivalent PF  0.5 mL Intramuscular Tomorrow-1000  . mouth rinse  15 mL Mouth Rinse BID  . nicotine  21 mg Transdermal Daily  . pantoprazole  40 mg Oral Q1200  . piperacillin-tazobactam (ZOSYN)  IV  3.375 g Intravenous Q8H  . propranolol ER  60 mg Oral Daily  . sodium chloride flush  3 mL Intravenous Q12H   Continuous Infusions:    LOS: 1 day    Time spent: 30 min    Renae Fickle, MD [facility] Pager [phone]  If 7PM-7AM, please contact night-coverage [email or web address] Password [email or web address] 11/08/2015, 6:01 PM

## 2015-11-09 ENCOUNTER — Encounter (HOSPITAL_COMMUNITY): Payer: Self-pay | Admitting: Gastroenterology

## 2015-11-09 ENCOUNTER — Inpatient Hospital Stay (HOSPITAL_COMMUNITY): Payer: Self-pay

## 2015-11-09 DIAGNOSIS — R7989 Other specified abnormal findings of blood chemistry: Secondary | ICD-10-CM

## 2015-11-09 LAB — COMPREHENSIVE METABOLIC PANEL
ALT: 24 U/L (ref 17–63)
AST: 56 U/L — ABNORMAL HIGH (ref 15–41)
Albumin: 2.7 g/dL — ABNORMAL LOW (ref 3.5–5.0)
Alkaline Phosphatase: 53 U/L (ref 38–126)
Anion gap: 5 (ref 5–15)
BUN: 10 mg/dL (ref 6–20)
CO2: 26 mmol/L (ref 22–32)
Calcium: 9 mg/dL (ref 8.9–10.3)
Chloride: 108 mmol/L (ref 101–111)
Creatinine, Ser: 1.16 mg/dL (ref 0.61–1.24)
GFR calc Af Amer: >60 mL/min (ref >60)
GFR calc non Af Amer: >60 mL/min (ref >60)
Glucose, Bld: 83 mg/dL (ref 65–99)
Potassium: 3.7 mmol/L (ref 3.5–5.1)
Sodium: 139 mmol/L (ref 135–145)
Total Bilirubin: 2.3 mg/dL — ABNORMAL HIGH (ref 0.3–1.2)
Total Protein: 6.2 g/dL — ABNORMAL LOW (ref 6.5–8.1)

## 2015-11-09 LAB — CBC
HCT: 35.9 % — ABNORMAL LOW (ref 39.0–52.0)
Hemoglobin: 12.5 g/dL — ABNORMAL LOW (ref 13.0–17.0)
MCH: 34.6 pg — ABNORMAL HIGH (ref 26.0–34.0)
MCHC: 34.8 g/dL (ref 30.0–36.0)
MCV: 99.4 fL (ref 78.0–100.0)
Platelets: 63 10*3/uL — ABNORMAL LOW (ref 150–400)
RBC: 3.61 MIL/uL — ABNORMAL LOW (ref 4.22–5.81)
RDW: 14.5 % (ref 11.5–15.5)
WBC: 6 10*3/uL (ref 4.0–10.5)

## 2015-11-09 LAB — MAGNESIUM: Magnesium: 1.7 mg/dL (ref 1.7–2.4)

## 2015-11-09 LAB — GLUCOSE, CAPILLARY: Glucose-Capillary: 88 mg/dL (ref 65–99)

## 2015-11-09 MED ORDER — MAGNESIUM SULFATE 2 GM/50ML IV SOLN
2.0000 g | Freq: Once | INTRAVENOUS | Status: AC
Start: 2015-11-09 — End: 2015-11-09
  Administered 2015-11-09: 2 g via INTRAVENOUS
  Filled 2015-11-09: qty 50

## 2015-11-09 MED ORDER — GADOBENATE DIMEGLUMINE 529 MG/ML IV SOLN
15.0000 mL | Freq: Once | INTRAVENOUS | Status: AC | PRN
  Administered 2015-11-09: 15 mL via INTRAVENOUS

## 2015-11-09 NOTE — Progress Notes (Signed)
PROGRESS NOTE  Jacob Hill  VWU:[id number] DOB: 23-Dec-1970 DOA: 11/07/2015 PCP: Sanda Linger, MD  Brief Narrative:    Jacob Hill is a 45 y.o. male with medical history significant of polysubstance abuse including alcohol, tobacco and marijuana, hypertension, COPD, GERD, liver cirrhosis, thrombocytopenia, neuropathy, who presented with RUQ pain.  CT abdomen/pelvis showed new dilatation of the common bile duct to 1.2 cm, with minimal intrahepatic biliary ductal dilatation,  ballbladder wall thickening.  GI was consulted.  EGD on 9/20 confirmed portal hypertensive gastropathy, grade 1 esophageal varices, reflux esophagitis, a nonspecific distal gastritis which was biopsied.  MRCP showed stable CBD and no evidence of stones.  Gallbladder wall was mildly thickened but this was attributed to mild ascites from cirrhosis.  HIDA has been ordered, but likely his pains are secondary to EtOH hepatitis.    Assessment & Plan:   Principal Problem:   Abdominal pain Active Problems:   COPD (chronic obstructive pulmonary disease) (HCC)   Alcoholic cirrhosis of liver (HCC)   Hypokalemia   Essential hypertension, benign   GERD (gastroesophageal reflux disease)   Marijuana abuse   Alcohol abuse   Tobacco abuse   Thrombocytopenia (HCC)   Gastric varices   Common bile duct dilation   Chronic RUQ pain   LFT elevation   Cocaine abuse   Portal hypertensive gastropathy   Esophageal varices without bleeding (HCC)  Right upper quadrant abdominal pain, likely due to EtOH hepatitis, however, will exclude gallbladder as etiology -  EGD:  Grade 1 varices, portal hypertensive gastropathy and mild gastritis -  MRCP:  No evidence of bile duct obstruction or stones -  HIDA pending -  continue Zosyn pending HIDA -  Appreciate gastroenterology assistance - Continue Protonix  Alcoholic cirrhosis of liver and Gastric varices, portal hypertensive gastropathy, splenomegaly.  -Started propranolol  which is also for hypertension on 9/19 -Avoid Tylenol -Protonix  Hypokalemia: K= 3.0 on admission. - Repleted -Mg level low:  2gm IV magnesium today  HTN: bp 141/107. Blood pressure improved -started propranolol as above -IV hydralazine when necessary  GERD: -Protonix  Polysubstance abuse: Including alcohol, tobacco, marijuana. Patient states that he did not drink alcohol in the past 7 days. Currently no signs of withdrawal. -Due to counseling about importance of quitting substance. -Nicotine patch -UDS positive for opiates  Thrombocytopenia (HCC): Previously 60 which was a 69 on 11/01/15. No active bleeding. Most likely due to liver cirrhosis and splenomegaly. -Follow-up CBC   DVT ppx: SCD Code Status: Full code Family Communication: None at bed side.   Disposition Plan:  Anticipate discharge back to previous home environment  Consultants:   Dr. Christella Hartigan, gastroenterology  Procedures:  EGD on 9/20 MRCP 9/21 HIDA pending  Antimicrobials:   None    Subjective: Patient states he continues to have intermittent right upper quadrant pain. He denies nausea and vomiting.  Denies obvious blood in the stools, black tarry stools.  Objective: Vitals:   11/08/15 1450 11/08/15 1513 11/08/15 2210 11/09/15 0549  BP: 115/69 119/76 120/73 121/79  Pulse: (!) 57 (!) 56 (!) 49 72  Resp: 13 16 16 16   Temp:  98.3 F (36.8 C) 98.7 F (37.1 C) 99.4 F (37.4 C)  TempSrc:  Oral Oral Oral  SpO2: 94% 97% 97% 98%  Weight:      Height:        Intake/Output Summary (Last 24 hours) at 11/09/15 1418 Last data filed at 11/09/15 1301  Gross per 24 hour  Intake  1246.66 ml  Output             2770 ml  Net         -1523.34 ml   Filed Weights   11/06/15 2358 11/07/15 0722  Weight: 76.6 kg (168 lb 14.4 oz) 76.6 kg (168 lb 14.7 oz)    Examination:  General exam:  Adult Male.  No acute distress.  HEENT:  NCAT, MMM Respiratory system: Clear to auscultation  bilaterally Cardiovascular system: Regular rate and rhythm, normal S1/S2. No murmurs, rubs, gallops or clicks.  Warm extremities Gastrointestinal system: Normal active bowel sounds, soft, nondistended.  Mild to moderate pain in RUQ with palpation during inspiration.  ABdomen otherwise soft and nontender.  MSK:  Normal tone and bulk, no lower extremity edema Neuro:  Grossly intact    Data Reviewed: I have personally reviewed following labs and imaging studies  CBC:  Recent Labs Lab 11/07/15 0100 11/08/15 0411 11/09/15 0518  WBC 6.1 7.3 6.0  NEUTROABS 3.3  --   --   HGB 13.9 12.9* 12.5*  HCT 38.7* 37.7* 35.9*  MCV 96.3 100.3* 99.4  PLT 60* 71* 63*   Basic Metabolic Panel:  Recent Labs Lab 11/07/15 0100 11/07/15 0602 11/08/15 0411 11/09/15 0518  NA 138  --  137 139  K 3.0*  --  4.1 3.7  CL 104  --  108 108  CO2 27  --  22 26  GLUCOSE 142*  --  86 83  BUN 8  --  7 10  CREATININE 0.92  --  1.03 1.16  CALCIUM 9.8  --  8.4* 9.0  MG  --  1.4* 1.5* 1.7   GFR: Estimated Creatinine Clearance: 88 mL/min (by C-G formula based on SCr of 1.16 mg/dL). Liver Function Tests:  Recent Labs Lab 11/07/15 0100 11/08/15 0411 11/09/15 0518  AST 61* 58* 56*  ALT 30 27 24   ALKPHOS 58 49 53  BILITOT 2.9* 3.3* 2.3*  PROT 7.4 6.8 6.2*  ALBUMIN 3.2* 2.9* 2.7*    Recent Labs Lab 11/07/15 0100  LIPASE 61*   No results for input(s): AMMONIA in the last 168 hours. Coagulation Profile:  Recent Labs Lab 11/07/15 0602  INR 1.45   Cardiac Enzymes: No results for input(s): CKTOTAL, CKMB, CKMBINDEX, TROPONINI in the last 168 hours. BNP (last 3 results) No results for input(s): PROBNP in the last 8760 hours. HbA1C: No results for input(s): HGBA1C in the last 72 hours. CBG:  Recent Labs Lab 11/07/15 0746 11/08/15 0753 11/09/15 0741  GLUCAP 77 84 88   Lipid Profile: No results for input(s): CHOL, HDL, LDLCALC, TRIG, CHOLHDL, LDLDIRECT in the last 72 hours. Thyroid  Function Tests: No results for input(s): TSH, T4TOTAL, FREET4, T3FREE, THYROIDAB in the last 72 hours. Anemia Panel: No results for input(s): VITAMINB12, FOLATE, FERRITIN, TIBC, IRON, RETICCTPCT in the last 72 hours. Urine analysis:    Component Value Date/Time   COLORURINE YELLOW 11/07/2015 0110   APPEARANCEUR CLEAR 11/07/2015 0110   LABSPEC 1.009 11/07/2015 0110   PHURINE 7.0 11/07/2015 0110   GLUCOSEU NEGATIVE 11/07/2015 0110   GLUCOSEU 100 02/05/2012 0947   HGBUR NEGATIVE 11/07/2015 0110   BILIRUBINUR NEGATIVE 11/07/2015 0110   KETONESUR NEGATIVE 11/07/2015 0110   PROTEINUR NEGATIVE 11/07/2015 0110   UROBILINOGEN >=8.0 02/05/2012 0947   NITRITE NEGATIVE 11/07/2015 0110   LEUKOCYTESUR NEGATIVE 11/07/2015 0110   Sepsis Labs: @LABRCNTIP (procalcitonin:4,lacticidven:4)  )No results found for this or any previous visit (from the past 240 hour(s)).  Radiology Studies: Mr 3d Recon At Scanner  Result Date: 11/09/2015 CLINICAL DATA:  45 year old male inpatient with polysubstance abuse, cirrhosis and abdominal pain with biliary ductal dilatation on recent CT study. Upper endoscopy 1 day prior demonstrated portal hypertensive gastropathy, grade 1 esophageal varices, reflux esophagitis and nonspecific distal gastritis. EXAM: MRI ABDOMEN WITHOUT AND WITH CONTRAST (INCLUDING MRCP) TECHNIQUE: Multiplanar multisequence MR imaging of the abdomen was performed both before and after the administration of intravenous contrast. Heavily T2-weighted images of the biliary and pancreatic ducts were obtained, and three-dimensional MRCP images were rendered by post processing. CONTRAST:  15mL MULTIHANCE GADOBENATE DIMEGLUMINE 529 MG/ML IV SOLN COMPARISON:  11/07/2015 CT abdomen.  02/10/2012 CT abdomen/pelvis. FINDINGS: Lower chest: Clear lung bases. Hepatobiliary: There is relative hypertrophy of the lateral segment left liver lobe and relative atrophy of the right liver lobe. There is mild diffuse  irregularity of the liver surface. There is patchy confluent lace-like T2 hyperintensity throughout the liver, most prominent in the segment 8 and segment 4a liver, indicating confluent hepatic fibrosis. These findings indicate cirrhosis. Mild diffuse hepatic steatosis. There are several (at least 5) subcentimeter simple cysts scattered throughout the liver, largest 0.8 cm in the inferior right liver lobe. No suspicious enhancing liver lesions. Nondistended gallbladder with mild diffuse gallbladder wall thickening, no gallstones and no pericholecystic fluid. There is mild central intrahepatic biliary ductal dilatation. Common bile duct diameter 11 mm, stable compared to 02/10/2012 CT. Smooth distal tapering of the common bile duct. No biliary strictures. No choledocholithiasis. No biliary or ampullary mass. Pancreas: No pancreatic mass or duct dilation.  No pancreas divisum. Spleen: Top normal size spleen (craniocaudal splenic length 11.7 cm). No splenic mass. Adrenals/Urinary Tract: Normal adrenals. No hydronephrosis. Simple 0.7 cm renal cyst in the upper left kidney. No suspicious renal mass. Stomach/Bowel: There are moderate varices involving the posterior gastric fundal wall (series 1104/ image 37), which appear to represent a shunt between the splenic and left renal veins. Otherwise grossly normal stomach. Visualized small and large bowel is normal caliber, with no bowel wall thickening. Vascular/Lymphatic: Atherosclerotic nonaneurysmal abdominal aorta. Patent portal, splenic, hepatic and renal veins. Mild esophageal and paraumbilical varices. There is a large portosystemic shunt from the superior mesenteric vein to the dilated right gonadal vein. No pathologically enlarged lymph nodes in the abdomen. Other: No abdominal ascites or focal fluid collection. Musculoskeletal: No aggressive appearing focal osseous lesions. IMPRESSION: 1. Cirrhosis. Scattered tiny and simple liver cysts. No suspicious liver mass. 2.  Moderate splenorenal shunt with associated varices involving the posterior gastric fundus wall. Large SMV-right gonadal vein portosystemic shunt. Small esophageal and paraumbilical varices. 3. No ascites.  Top-normal size spleen. 4. Mild diffuse gallbladder wall thickening. No gallbladder distention. No cholelithiasis. The gallbladder wall thickening is probably due to noninflammatory edema due to the patient's hypoalbuminemia and portal hypertension. 5. Chronic common bile duct dilatation with smooth distal tapering, unchanged back to 2013. No choledocholithiasis. No biliary strictures. No biliary or ampullary mass. Electronically Signed   By: Delbert PhenixJason A Poff M.D.   On: 11/09/2015 09:22   Mr Roe Coombsbd W/wo Cm/mrcp  Result Date: 11/09/2015 CLINICAL DATA:  45 year old male inpatient with polysubstance abuse, cirrhosis and abdominal pain with biliary ductal dilatation on recent CT study. Upper endoscopy 1 day prior demonstrated portal hypertensive gastropathy, grade 1 esophageal varices, reflux esophagitis and nonspecific distal gastritis. EXAM: MRI ABDOMEN WITHOUT AND WITH CONTRAST (INCLUDING MRCP) TECHNIQUE: Multiplanar multisequence MR imaging of the abdomen was performed both before and after the administration of intravenous contrast.  Heavily T2-weighted images of the biliary and pancreatic ducts were obtained, and three-dimensional MRCP images were rendered by post processing. CONTRAST:  15mL MULTIHANCE GADOBENATE DIMEGLUMINE 529 MG/ML IV SOLN COMPARISON:  11/07/2015 CT abdomen.  02/10/2012 CT abdomen/pelvis. FINDINGS: Lower chest: Clear lung bases. Hepatobiliary: There is relative hypertrophy of the lateral segment left liver lobe and relative atrophy of the right liver lobe. There is mild diffuse irregularity of the liver surface. There is patchy confluent lace-like T2 hyperintensity throughout the liver, most prominent in the segment 8 and segment 4a liver, indicating confluent hepatic fibrosis. These findings  indicate cirrhosis. Mild diffuse hepatic steatosis. There are several (at least 5) subcentimeter simple cysts scattered throughout the liver, largest 0.8 cm in the inferior right liver lobe. No suspicious enhancing liver lesions. Nondistended gallbladder with mild diffuse gallbladder wall thickening, no gallstones and no pericholecystic fluid. There is mild central intrahepatic biliary ductal dilatation. Common bile duct diameter 11 mm, stable compared to 02/10/2012 CT. Smooth distal tapering of the common bile duct. No biliary strictures. No choledocholithiasis. No biliary or ampullary mass. Pancreas: No pancreatic mass or duct dilation.  No pancreas divisum. Spleen: Top normal size spleen (craniocaudal splenic length 11.7 cm). No splenic mass. Adrenals/Urinary Tract: Normal adrenals. No hydronephrosis. Simple 0.7 cm renal cyst in the upper left kidney. No suspicious renal mass. Stomach/Bowel: There are moderate varices involving the posterior gastric fundal wall (series 1104/ image 37), which appear to represent a shunt between the splenic and left renal veins. Otherwise grossly normal stomach. Visualized small and large bowel is normal caliber, with no bowel wall thickening. Vascular/Lymphatic: Atherosclerotic nonaneurysmal abdominal aorta. Patent portal, splenic, hepatic and renal veins. Mild esophageal and paraumbilical varices. There is a large portosystemic shunt from the superior mesenteric vein to the dilated right gonadal vein. No pathologically enlarged lymph nodes in the abdomen. Other: No abdominal ascites or focal fluid collection. Musculoskeletal: No aggressive appearing focal osseous lesions. IMPRESSION: 1. Cirrhosis. Scattered tiny and simple liver cysts. No suspicious liver mass. 2. Moderate splenorenal shunt with associated varices involving the posterior gastric fundus wall. Large SMV-right gonadal vein portosystemic shunt. Small esophageal and paraumbilical varices. 3. No ascites.  Top-normal  size spleen. 4. Mild diffuse gallbladder wall thickening. No gallbladder distention. No cholelithiasis. The gallbladder wall thickening is probably due to noninflammatory edema due to the patient's hypoalbuminemia and portal hypertension. 5. Chronic common bile duct dilatation with smooth distal tapering, unchanged back to 2013. No choledocholithiasis. No biliary strictures. No biliary or ampullary mass. Electronically Signed   By: Delbert Phenix M.D.   On: 11/09/2015 09:22     Scheduled Meds: . mouth rinse  15 mL Mouth Rinse BID  . nicotine  21 mg Transdermal Daily  . pantoprazole  40 mg Oral Q1200  . piperacillin-tazobactam (ZOSYN)  IV  3.375 g Intravenous Q8H  . propranolol ER  60 mg Oral Daily  . sodium chloride flush  3 mL Intravenous Q12H   Continuous Infusions: . dextrose 5 % and 0.45% NaCl 50 mL/hr (11/09/15 1315)     LOS: 2 days    Time spent: 30 min    Renae Fickle, MD [facility] Pager [phone]  If 7PM-7AM, please contact night-coverage [email or web address] Password [email or web address] 11/09/2015, 2:18 PM

## 2015-11-09 NOTE — Progress Notes (Signed)
Progress Note   Subjective  Chief Complaint: Abdominal pain, elevated LFTs, dilated CBD  This morning patient tells me that he is "ready to eat", as he has not been able to have anything for the past 3 days. He denies new symptoms and tells me his abdominal pain is improved to a 5/10. He is aware of findings from EGD yesterday including varices.   Objective   Vital signs in last 24 hours: Temp:  [98.3 F (36.8 C)-99.4 F (37.4 C)] 99.4 F (37.4 C) (09/21 0549) Pulse Rate:  [49-72] 72 (09/21 0549) Resp:  [11-19] 16 (09/21 0549) BP: (115-140)/(61-85) 121/79 (09/21 0549) SpO2:  [93 %-99 %] 98 % (09/21 0549) Last BM Date: 11/08/15 General: Caucasian male in NAD Heart:  Regular rate and rhythm; no murmurs Lungs: Respirations even and unlabored, lungs CTA bilaterally Abdomen:  Soft, nontender and nondistended. Normal bowel sounds. Extremities:  Without edema. Neurologic:  Alert and oriented,  grossly normal neurologically. Psych:  Cooperative. Normal mood and affect.  Intake/Output from previous day: 09/20 0701 - 09/21 0700 In: 1046.7 [I.V.:946.7; IV Piggyback:100] Out: 1770 [Urine:1770]  Lab Results:  Recent Labs  11/07/15 0100 11/08/15 0411 11/09/15 0518  WBC 6.1 7.3 6.0  HGB 13.9 12.9* 12.5*  HCT 38.7* 37.7* 35.9*  PLT 60* 71* 63*   BMET  Recent Labs  11/07/15 0100 11/08/15 0411 11/09/15 0518  NA 138 137 139  K 3.0* 4.1 3.7  CL 104 108 108  CO2 27 22 26   GLUCOSE 142* 86 83  BUN 8 7 10   CREATININE 0.92 1.03 1.16  CALCIUM 9.8 8.4* 9.0   LFT  Recent Labs  11/09/15 0518  PROT 6.2*  ALBUMIN 2.7*  AST 56*  ALT 24  ALKPHOS 53  BILITOT 2.3*   PT/INR  Recent Labs  11/07/15 0602  LABPROT 17.8*  INR 1.45    Studies/Results: Mr 3d Recon At Scanner  Result Date: 11/09/2015 CLINICAL DATA:  45 year old male inpatient with polysubstance abuse, cirrhosis and abdominal pain with biliary ductal dilatation on recent CT study. Upper endoscopy 1 day  prior demonstrated portal hypertensive gastropathy, grade 1 esophageal varices, reflux esophagitis and nonspecific distal gastritis. EXAM: MRI ABDOMEN WITHOUT AND WITH CONTRAST (INCLUDING MRCP) TECHNIQUE: Multiplanar multisequence MR imaging of the abdomen was performed both before and after the administration of intravenous contrast. Heavily T2-weighted images of the biliary and pancreatic ducts were obtained, and three-dimensional MRCP images were rendered by post processing. CONTRAST:  15mL MULTIHANCE GADOBENATE DIMEGLUMINE 529 MG/ML IV SOLN COMPARISON:  11/07/2015 CT abdomen.  02/10/2012 CT abdomen/pelvis. FINDINGS: Lower chest: Clear lung bases. Hepatobiliary: There is relative hypertrophy of the lateral segment left liver lobe and relative atrophy of the right liver lobe. There is mild diffuse irregularity of the liver surface. There is patchy confluent lace-like T2 hyperintensity throughout the liver, most prominent in the segment 8 and segment 4a liver, indicating confluent hepatic fibrosis. These findings indicate cirrhosis. Mild diffuse hepatic steatosis. There are several (at least 5) subcentimeter simple cysts scattered throughout the liver, largest 0.8 cm in the inferior right liver lobe. No suspicious enhancing liver lesions. Nondistended gallbladder with mild diffuse gallbladder wall thickening, no gallstones and no pericholecystic fluid. There is mild central intrahepatic biliary ductal dilatation. Common bile duct diameter 11 mm, stable compared to 02/10/2012 CT. Smooth distal tapering of the common bile duct. No biliary strictures. No choledocholithiasis. No biliary or ampullary mass. Pancreas: No pancreatic mass or duct dilation.  No pancreas divisum. Spleen: Top normal  size spleen (craniocaudal splenic length 11.7 cm). No splenic mass. Adrenals/Urinary Tract: Normal adrenals. No hydronephrosis. Simple 0.7 cm renal cyst in the upper left kidney. No suspicious renal mass. Stomach/Bowel: There are  moderate varices involving the posterior gastric fundal wall (series 1104/ image 37), which appear to represent a shunt between the splenic and left renal veins. Otherwise grossly normal stomach. Visualized small and large bowel is normal caliber, with no bowel wall thickening. Vascular/Lymphatic: Atherosclerotic nonaneurysmal abdominal aorta. Patent portal, splenic, hepatic and renal veins. Mild esophageal and paraumbilical varices. There is a large portosystemic shunt from the superior mesenteric vein to the dilated right gonadal vein. No pathologically enlarged lymph nodes in the abdomen. Other: No abdominal ascites or focal fluid collection. Musculoskeletal: No aggressive appearing focal osseous lesions. IMPRESSION: 1. Cirrhosis. Scattered tiny and simple liver cysts. No suspicious liver mass. 2. Moderate splenorenal shunt with associated varices involving the posterior gastric fundus wall. Large SMV-right gonadal vein portosystemic shunt. Small esophageal and paraumbilical varices. 3. No ascites.  Top-normal size spleen. 4. Mild diffuse gallbladder wall thickening. No gallbladder distention. No cholelithiasis. The gallbladder wall thickening is probably due to noninflammatory edema due to the patient's hypoalbuminemia and portal hypertension. 5. Chronic common bile duct dilatation with smooth distal tapering, unchanged back to 2013. No choledocholithiasis. No biliary strictures. No biliary or ampullary mass. Electronically Signed   By: Delbert Phenix M.D.   On: 11/09/2015 09:22   Mr Roe Coombs W/wo Cm/mrcp  Result Date: 11/09/2015 CLINICAL DATA:  45 year old male inpatient with polysubstance abuse, cirrhosis and abdominal pain with biliary ductal dilatation on recent CT study. Upper endoscopy 1 day prior demonstrated portal hypertensive gastropathy, grade 1 esophageal varices, reflux esophagitis and nonspecific distal gastritis. EXAM: MRI ABDOMEN WITHOUT AND WITH CONTRAST (INCLUDING MRCP) TECHNIQUE: Multiplanar  multisequence MR imaging of the abdomen was performed both before and after the administration of intravenous contrast. Heavily T2-weighted images of the biliary and pancreatic ducts were obtained, and three-dimensional MRCP images were rendered by post processing. CONTRAST:  15mL MULTIHANCE GADOBENATE DIMEGLUMINE 529 MG/ML IV SOLN COMPARISON:  11/07/2015 CT abdomen.  02/10/2012 CT abdomen/pelvis. FINDINGS: Lower chest: Clear lung bases. Hepatobiliary: There is relative hypertrophy of the lateral segment left liver lobe and relative atrophy of the right liver lobe. There is mild diffuse irregularity of the liver surface. There is patchy confluent lace-like T2 hyperintensity throughout the liver, most prominent in the segment 8 and segment 4a liver, indicating confluent hepatic fibrosis. These findings indicate cirrhosis. Mild diffuse hepatic steatosis. There are several (at least 5) subcentimeter simple cysts scattered throughout the liver, largest 0.8 cm in the inferior right liver lobe. No suspicious enhancing liver lesions. Nondistended gallbladder with mild diffuse gallbladder wall thickening, no gallstones and no pericholecystic fluid. There is mild central intrahepatic biliary ductal dilatation. Common bile duct diameter 11 mm, stable compared to 02/10/2012 CT. Smooth distal tapering of the common bile duct. No biliary strictures. No choledocholithiasis. No biliary or ampullary mass. Pancreas: No pancreatic mass or duct dilation.  No pancreas divisum. Spleen: Top normal size spleen (craniocaudal splenic length 11.7 cm). No splenic mass. Adrenals/Urinary Tract: Normal adrenals. No hydronephrosis. Simple 0.7 cm renal cyst in the upper left kidney. No suspicious renal mass. Stomach/Bowel: There are moderate varices involving the posterior gastric fundal wall (series 1104/ image 37), which appear to represent a shunt between the splenic and left renal veins. Otherwise grossly normal stomach. Visualized small and  large bowel is normal caliber, with no bowel wall thickening. Vascular/Lymphatic: Atherosclerotic nonaneurysmal  abdominal aorta. Patent portal, splenic, hepatic and renal veins. Mild esophageal and paraumbilical varices. There is a large portosystemic shunt from the superior mesenteric vein to the dilated right gonadal vein. No pathologically enlarged lymph nodes in the abdomen. Other: No abdominal ascites or focal fluid collection. Musculoskeletal: No aggressive appearing focal osseous lesions. IMPRESSION: 1. Cirrhosis. Scattered tiny and simple liver cysts. No suspicious liver mass. 2. Moderate splenorenal shunt with associated varices involving the posterior gastric fundus wall. Large SMV-right gonadal vein portosystemic shunt. Small esophageal and paraumbilical varices. 3. No ascites.  Top-normal size spleen. 4. Mild diffuse gallbladder wall thickening. No gallbladder distention. No cholelithiasis. The gallbladder wall thickening is probably due to noninflammatory edema due to the patient's hypoalbuminemia and portal hypertension. 5. Chronic common bile duct dilatation with smooth distal tapering, unchanged back to 2013. No choledocholithiasis. No biliary strictures. No biliary or ampullary mass. Electronically Signed   By: Delbert Phenix M.D.   On: 11/09/2015 09:22   EGD 11/08/15-Dr. Christella Hartigan Findings:      1. Grade I varices were found in the distal esophagus with mild       overlying reflux related esophagitis.      2. Moderate appearing portal hypertensive gastropathy throughout the       stomach. Distally this was more typical appearing non-specific       gastritis, biopsies were taken and sent to pathology (H. pylori?)      3. Tubular structure in very proximal stomach that probably represents a       gastric varix, without associated stigmata of recent bleeding.      4. Moderate duodenitis, non-specific.      5. The exam was otherwise without abnormality. Impression:               - Grade I  esophageal varices.                           - Reflux esophagitis                           - Small proximal gastric varix                           - Portal gastropathy.                           - Non-specific distal gastritis. Biopsied.                           - The examination was otherwise normal.  Recommendation:           - Return patient to hospital ward for ongoing care.                           - Advance diet as tolerated.                           - Continue present medications.                           - These findings do not likely explain his abd  pains. His liver tests have been elevated for                            several years (Tbili 2-4 over several years) and I                            suspect this is more a reflection of his baseline                            cirrhosis +/- acute etoh hepatitis than a separate                            biliary etiology. Given the CT reported CBD 1.2cm I                            will order MRI with MRCP images to get a more                            accurate biliary assessment than CT can provide.   Assessment / Plan:    Impression: 1. Abnormal CT of the abdomen and pelvis: As above showing dilation of the common bile duct 1.2 cm with minimal intrahepatic biliary ductal dilatation, wall thickening noted with regard to the gallbladder and mild surrounding soft tissue inflammation; MRCP as above which indicates chronic dilation of the common bile duct, since 2012; again at this time continued to continue to believe this is related to patient's acute hepatitis and cirrhosis and not from acute cholecystitis or gallbladder etiology, patient does continue with some mild right upper quadrant pain, LFTs are improving, will order HIDA scan today to be thorough 2. Right upper quadrant abdominal pain: Likely related to above 3. Elevated LFT's: Improving 4. Alcoholic cirrhosis of the liver with gastric varices:  Seen on recent CT, verified at time of EGD 11/08/15 as above 5. Hypokalemia: 3.0 on admission, being repleted 6. GERD: Long history, patient has been off of prescription medicines for the past year and has been using "[employer] brand", continues with frequent heartburn and reflux-currently controlled on Protonix 7. Polysubstance abuse: Including alcohol, tobacco and marijuana, patient denies alcohol use in the past 12 days. Drug test positive for cocaine and opiates on 11/07/15. 8. Thrombocytopenia: Previously 60, 69 on 11/01/15 likely due to liver cirrhosis and splenomegaly  Plan: 1. Continue to believe patient's pain is from acute hepatitis, see EGD recommendations above  2. Ordering a HIDA scan for today. 3. Continue supportive measures 4. Will discuss above with Dr. Christella Hartigan, please await any further recommendations  Thank you for your kind consultation, we will continue to follow   LOS: 2 days   Unk Lightning  11/09/2015, 9:54 AM  Pager # [phone]   ________________________________________________________________________  [facility] MD note:  I reviewed the data and agree with the assessment and plan described above. HIDA scan today however I feel most likely his pains are more related to chronic liver disease +/- acute alc hepatitis (based on transaminase ratio).     Rob Bunting, MD [facility] Pager [phone]

## 2015-11-10 ENCOUNTER — Inpatient Hospital Stay (HOSPITAL_COMMUNITY): Payer: Self-pay

## 2015-11-10 LAB — COMPREHENSIVE METABOLIC PANEL
ALT: 23 U/L (ref 17–63)
AST: 47 U/L — ABNORMAL HIGH (ref 15–41)
Albumin: 2.6 g/dL — ABNORMAL LOW (ref 3.5–5.0)
Alkaline Phosphatase: 49 U/L (ref 38–126)
Anion gap: 5 (ref 5–15)
BUN: 8 mg/dL (ref 6–20)
CO2: 29 mmol/L (ref 22–32)
Calcium: 8.9 mg/dL (ref 8.9–10.3)
Chloride: 105 mmol/L (ref 101–111)
Creatinine, Ser: 1.14 mg/dL (ref 0.61–1.24)
GFR calc Af Amer: >60 mL/min (ref >60)
GFR calc non Af Amer: >60 mL/min (ref >60)
Glucose, Bld: 98 mg/dL (ref 65–99)
Potassium: 3.7 mmol/L (ref 3.5–5.1)
Sodium: 139 mmol/L (ref 135–145)
Total Bilirubin: 1.7 mg/dL — ABNORMAL HIGH (ref 0.3–1.2)
Total Protein: 6 g/dL — ABNORMAL LOW (ref 6.5–8.1)

## 2015-11-10 LAB — CBC
HCT: 34.9 % — ABNORMAL LOW (ref 39.0–52.0)
Hemoglobin: 12.3 g/dL — ABNORMAL LOW (ref 13.0–17.0)
MCH: 34.6 pg — ABNORMAL HIGH (ref 26.0–34.0)
MCHC: 35.2 g/dL (ref 30.0–36.0)
MCV: 98 fL (ref 78.0–100.0)
Platelets: 57 10*3/uL — ABNORMAL LOW (ref 150–400)
RBC: 3.56 MIL/uL — ABNORMAL LOW (ref 4.22–5.81)
RDW: 14 % (ref 11.5–15.5)
WBC: 5.3 10*3/uL (ref 4.0–10.5)

## 2015-11-10 LAB — GLUCOSE, CAPILLARY: Glucose-Capillary: 108 mg/dL — ABNORMAL HIGH (ref 65–99)

## 2015-11-10 LAB — MAGNESIUM: Magnesium: 1.3 mg/dL — ABNORMAL LOW (ref 1.7–2.4)

## 2015-11-10 MED ORDER — PROPRANOLOL HCL ER 60 MG PO CP24: 60.0000 mg | ORAL_CAPSULE | Freq: Every day | ORAL | 0 refills | Status: DC

## 2015-11-10 MED ORDER — VITAMIN B-1 100 MG PO TABS: 100.0000 mg | ORAL_TABLET | Freq: Every day | ORAL | 0 refills | Status: DC

## 2015-11-10 MED ORDER — MAGNESIUM OXIDE 400 (241.3 MG) MG PO TABS: 400.0000 mg | ORAL_TABLET | Freq: Two times a day (BID) | ORAL | 0 refills | Status: DC

## 2015-11-10 MED ORDER — TECHNETIUM TC 99M MEBROFENIN IV KIT
5.5000 | PACK | Freq: Once | INTRAVENOUS | Status: AC | PRN
  Administered 2015-11-10: 5.5 via INTRAVENOUS

## 2015-11-10 MED ORDER — OMEPRAZOLE 20 MG PO CPDR: 20.0000 mg | DELAYED_RELEASE_CAPSULE | Freq: Every day | ORAL | 0 refills | Status: DC

## 2015-11-10 MED ORDER — FOLIC ACID 1 MG PO TABS: 1.0000 mg | ORAL_TABLET | Freq: Every day | ORAL | 0 refills | Status: DC

## 2015-11-10 MED FILL — PROPRANOLOL ER 60 MG CAP: 60 | 30 days supply | Qty: 30 | Fill #0

## 2015-11-10 MED FILL — ?OMEPRAZOLE DR 20 MG CAPSUL: 20 | 30 days supply | Qty: 30 | Fill #0

## 2015-11-10 MED FILL — FOLIC ACID 1 MG TABLET: 1 | 30 days supply | Qty: 30 | Fill #0

## 2015-11-10 MED FILL — MAGNESIUM OXIDE 400 MG TAB: 400 (240 MG | 30 days supply | Qty: 60 | Fill #0

## 2015-11-10 NOTE — Progress Notes (Signed)
Went over all discharge information with patient and family.  All questions answered.  Discharge summary given. Explained importance of taking medications as prescribed and going to follow up appointment.  Pt refused wheelchair for dischage, preferred to walk out.

## 2015-11-10 NOTE — Progress Notes (Signed)
Progress Note   Subjective  Chief Complaint: Abdominal pain, elevated LFT's, dilated CBD  Pt doing well this morning, continues with 4-5/10 RUQ pain, enjoyed his regular diet yesterday and is eager to have HIDA scan this morning. Did have BM yesterday.  Per radiology, pt had pain medication this morning and they will wait to do HIDA scan at noon today so this does not hinder results. Pt to remain w/o pain meds and NPO until then.   Objective   Vital signs in last 24 hours: Temp:  [99.1 F (37.3 C)-99.2 F (37.3 C)] 99.2 F (37.3 C) (09/22 0559) Pulse Rate:  [54-77] 77 (09/22 0559) Resp:  [18] 18 (09/22 0559) BP: (130-136)/(75-91) 130/91 (09/22 0559) SpO2:  [96 %-97 %] 96 % (09/22 0559) Last BM Date: 11/08/15 General: Caucasian male in NAD Heart:  Regular rate and rhythm; no murmurs Lungs: Respirations even and unlabored, lungs CTA bilaterally Abdomen:  Soft, mild RUQ ttp and nondistended. Normal bowel sounds. Extremities:  Without edema. Neurologic:  Alert and oriented,  grossly normal neurologically. Psych:  Cooperative. Normal mood and affect.  Intake/Output from previous day: 09/21 0701 - 09/22 0700 In: 600 [P.O.:600] Out: 1275 [Urine:1275]  Lab Results:  Recent Labs  11/08/15 0411 11/09/15 0518 11/10/15 0425  WBC 7.3 6.0 5.3  HGB 12.9* 12.5* 12.3*  HCT 37.7* 35.9* 34.9*  PLT 71* 63* 57*   BMET  Recent Labs  11/08/15 0411 11/09/15 0518 11/10/15 0425  NA 137 139 139  K 4.1 3.7 3.7  CL 108 108 105  CO2 22 26 29   GLUCOSE 86 83 98  BUN 7 10 8   CREATININE 1.03 1.16 1.14  CALCIUM 8.4* 9.0 8.9   LFT  Recent Labs  11/10/15 0425  PROT 6.0*  ALBUMIN 2.6*  AST 47*  ALT 23  ALKPHOS 49  BILITOT 1.7*   PT/INR No results for input(s): LABPROT, INR in the last 72 hours.  Studies/Results: Mr 3d Recon At Scanner  Result Date: 11/09/2015 CLINICAL DATA:  45 year old male inpatient with polysubstance abuse, cirrhosis and abdominal pain with biliary  ductal dilatation on recent CT study. Upper endoscopy 1 day prior demonstrated portal hypertensive gastropathy, grade 1 esophageal varices, reflux esophagitis and nonspecific distal gastritis. EXAM: MRI ABDOMEN WITHOUT AND WITH CONTRAST (INCLUDING MRCP) TECHNIQUE: Multiplanar multisequence MR imaging of the abdomen was performed both before and after the administration of intravenous contrast. Heavily T2-weighted images of the biliary and pancreatic ducts were obtained, and three-dimensional MRCP images were rendered by post processing. CONTRAST:  15mL MULTIHANCE GADOBENATE DIMEGLUMINE 529 MG/ML IV SOLN COMPARISON:  11/07/2015 CT abdomen.  02/10/2012 CT abdomen/pelvis. FINDINGS: Lower chest: Clear lung bases. Hepatobiliary: There is relative hypertrophy of the lateral segment left liver lobe and relative atrophy of the right liver lobe. There is mild diffuse irregularity of the liver surface. There is patchy confluent lace-like T2 hyperintensity throughout the liver, most prominent in the segment 8 and segment 4a liver, indicating confluent hepatic fibrosis. These findings indicate cirrhosis. Mild diffuse hepatic steatosis. There are several (at least 5) subcentimeter simple cysts scattered throughout the liver, largest 0.8 cm in the inferior right liver lobe. No suspicious enhancing liver lesions. Nondistended gallbladder with mild diffuse gallbladder wall thickening, no gallstones and no pericholecystic fluid. There is mild central intrahepatic biliary ductal dilatation. Common bile duct diameter 11 mm, stable compared to 02/10/2012 CT. Smooth distal tapering of the common bile duct. No biliary strictures. No choledocholithiasis. No biliary or ampullary mass. Pancreas: No pancreatic  mass or duct dilation.  No pancreas divisum. Spleen: Top normal size spleen (craniocaudal splenic length 11.7 cm). No splenic mass. Adrenals/Urinary Tract: Normal adrenals. No hydronephrosis. Simple 0.7 cm renal cyst in the upper left  kidney. No suspicious renal mass. Stomach/Bowel: There are moderate varices involving the posterior gastric fundal wall (series 1104/ image 37), which appear to represent a shunt between the splenic and left renal veins. Otherwise grossly normal stomach. Visualized small and large bowel is normal caliber, with no bowel wall thickening. Vascular/Lymphatic: Atherosclerotic nonaneurysmal abdominal aorta. Patent portal, splenic, hepatic and renal veins. Mild esophageal and paraumbilical varices. There is a large portosystemic shunt from the superior mesenteric vein to the dilated right gonadal vein. No pathologically enlarged lymph nodes in the abdomen. Other: No abdominal ascites or focal fluid collection. Musculoskeletal: No aggressive appearing focal osseous lesions. IMPRESSION: 1. Cirrhosis. Scattered tiny and simple liver cysts. No suspicious liver mass. 2. Moderate splenorenal shunt with associated varices involving the posterior gastric fundus wall. Large SMV-right gonadal vein portosystemic shunt. Small esophageal and paraumbilical varices. 3. No ascites.  Top-normal size spleen. 4. Mild diffuse gallbladder wall thickening. No gallbladder distention. No cholelithiasis. The gallbladder wall thickening is probably due to noninflammatory edema due to the patient's hypoalbuminemia and portal hypertension. 5. Chronic common bile duct dilatation with smooth distal tapering, unchanged back to 2013. No choledocholithiasis. No biliary strictures. No biliary or ampullary mass. Electronically Signed   By: Delbert Phenix M.D.   On: 11/09/2015 09:22   Mr Roe Coombs W/wo Cm/mrcp  Result Date: 11/09/2015 CLINICAL DATA:  45 year old male inpatient with polysubstance abuse, cirrhosis and abdominal pain with biliary ductal dilatation on recent CT study. Upper endoscopy 1 day prior demonstrated portal hypertensive gastropathy, grade 1 esophageal varices, reflux esophagitis and nonspecific distal gastritis. EXAM: MRI ABDOMEN WITHOUT AND  WITH CONTRAST (INCLUDING MRCP) TECHNIQUE: Multiplanar multisequence MR imaging of the abdomen was performed both before and after the administration of intravenous contrast. Heavily T2-weighted images of the biliary and pancreatic ducts were obtained, and three-dimensional MRCP images were rendered by post processing. CONTRAST:  15mL MULTIHANCE GADOBENATE DIMEGLUMINE 529 MG/ML IV SOLN COMPARISON:  11/07/2015 CT abdomen.  02/10/2012 CT abdomen/pelvis. FINDINGS: Lower chest: Clear lung bases. Hepatobiliary: There is relative hypertrophy of the lateral segment left liver lobe and relative atrophy of the right liver lobe. There is mild diffuse irregularity of the liver surface. There is patchy confluent lace-like T2 hyperintensity throughout the liver, most prominent in the segment 8 and segment 4a liver, indicating confluent hepatic fibrosis. These findings indicate cirrhosis. Mild diffuse hepatic steatosis. There are several (at least 5) subcentimeter simple cysts scattered throughout the liver, largest 0.8 cm in the inferior right liver lobe. No suspicious enhancing liver lesions. Nondistended gallbladder with mild diffuse gallbladder wall thickening, no gallstones and no pericholecystic fluid. There is mild central intrahepatic biliary ductal dilatation. Common bile duct diameter 11 mm, stable compared to 02/10/2012 CT. Smooth distal tapering of the common bile duct. No biliary strictures. No choledocholithiasis. No biliary or ampullary mass. Pancreas: No pancreatic mass or duct dilation.  No pancreas divisum. Spleen: Top normal size spleen (craniocaudal splenic length 11.7 cm). No splenic mass. Adrenals/Urinary Tract: Normal adrenals. No hydronephrosis. Simple 0.7 cm renal cyst in the upper left kidney. No suspicious renal mass. Stomach/Bowel: There are moderate varices involving the posterior gastric fundal wall (series 1104/ image 37), which appear to represent a shunt between the splenic and left renal veins.  Otherwise grossly normal stomach. Visualized small and large bowel  is normal caliber, with no bowel wall thickening. Vascular/Lymphatic: Atherosclerotic nonaneurysmal abdominal aorta. Patent portal, splenic, hepatic and renal veins. Mild esophageal and paraumbilical varices. There is a large portosystemic shunt from the superior mesenteric vein to the dilated right gonadal vein. No pathologically enlarged lymph nodes in the abdomen. Other: No abdominal ascites or focal fluid collection. Musculoskeletal: No aggressive appearing focal osseous lesions. IMPRESSION: 1. Cirrhosis. Scattered tiny and simple liver cysts. No suspicious liver mass. 2. Moderate splenorenal shunt with associated varices involving the posterior gastric fundus wall. Large SMV-right gonadal vein portosystemic shunt. Small esophageal and paraumbilical varices. 3. No ascites.  Top-normal size spleen. 4. Mild diffuse gallbladder wall thickening. No gallbladder distention. No cholelithiasis. The gallbladder wall thickening is probably due to noninflammatory edema due to the patient's hypoalbuminemia and portal hypertension. 5. Chronic common bile duct dilatation with smooth distal tapering, unchanged back to 2013. No choledocholithiasis. No biliary strictures. No biliary or ampullary mass. Electronically Signed   By: Delbert Phenix M.D.   On: 11/09/2015 09:22   EGD 11/08/15-Dr. Christella Hartigan Findings: 1. Grade I varices were found in the distal esophagus with mild  overlying reflux related esophagitis. 2. Moderate appearing portal hypertensive gastropathy throughout the  stomach. Distally this was more typical appearing non-specific  gastritis, biopsies were taken and sent to pathology (H. pylori?) 3. Tubular structure in very proximal stomach that probably represents a  gastric varix, without associated stigmata of recent bleeding. 4. Moderate duodenitis, non-specific. 5. The exam was otherwise without  abnormality. Impression: - Grade I esophageal varices. - Reflux esophagitis - Small proximal gastric varix - Portal gastropathy. - Non-specific distal gastritis. Biopsied. - The examination was otherwise normal.  Recommendation: - Return patient to hospital ward for ongoing care. - Advance diet as tolerated. - Continue present medications. - These findings do not likely explain his abd  pains. His liver tests have been elevated for  several years (Tbili 2-4 over several years) and I  suspect this is more a reflection of his baseline  cirrhosis +/- acute etoh hepatitis than a separate  biliary etiology. Given the CT reported CBD 1.2cm I  will order MRI with MRCP images to get a more  accurate biliary assessment than CT can provide.    Assessment / Plan:   Impression: 1. Abnormal CT of the abdomen and pelvis:As above showing dilation of the common bile duct 1.2 cm with minimal intrahepatic biliary ductal dilatation, wall thickening noted with regard to the gallbladder and mild surrounding soft tissue inflammation;MRCP as above which indicates chronic dilation of the common bile duct, since 2012; again at this time continued to continue to believe this is related to patient's acute hepatitis and cirrhosis and not from acute cholecystitis or gallbladder etiology, patient does continue with some mild right upper quadrant pain, LFTs are improving, per radiology HIDA scan will be performed at noon today after pain meds have worn off for more accurate results 2. Right upper quadrant  abdominal pain:Likely related to above 3. Elevated LFT's: Improving 4. Alcoholic cirrhosis of the liver with gastric varices:Seen on recent CT, verified at time of EGD 11/08/15 as above 5. Hypokalemia:normalized after replacement 6. GERD: Long history, patient has been off of prescription medicines for the past year andhas been using "[employer]brand", continues with frequent heartburn and reflux-currently controlled on Protonix 7. Polysubstance abuse:Including alcohol, tobacco and marijuana, patient denies alcohol use in the past 12 days. Drug test positive for cocaine and opiates on 11/07/15. 8. Thrombocytopenia:likely due to liver cirrhosis  and splenomegaly  Plan: 1. Continue to believe patient's pain is from acute hepatitis, see EGD recommendations above  2. HIDA scan ordered 11/09/15-will be done today at noon per radiology 3. Continue supportive measures 4. Will discuss above with Dr. Christella Hartigan, please await any further recommendations  Thank you for your kind consultation, we will continue to follow   LOS: 3 days   Unk Lightning  11/10/2015, 8:59 AM  Pager # [phone] \   ________________________________________________________________________  [facility] MD note:  I reviewed the data and agree with the assessment and plan described above.  He was at nuc med when I came by to see him.  Results of the HIDA still pending.  If this suggests acute cholecystitis then he'll need general surgery consult. If not then he is safe for d/c.  He should do his best to completely stop etoh and not resume, lay off of other illicit drugs as well (was cocaine+ at admission).  He should call [facility] office for follow up appt in 5-6 weeks to discuss long term care plan for his cirrhosis.     Rob Bunting, MD [facility] Pager [phone]

## 2015-11-10 NOTE — Discharge Summary (Signed)
Physician Discharge Summary  Andres ShadMichael G Totty QMV:[id number] DOB: 08/28/1970 DOA: 11/07/2015  PCP: Sanda Lingerhomas Jones, MD  Admit date: 11/07/2015 Discharge date: 11/10/2015  Admitted From: home  Disposition:  home  Recommendations for Outpatient Follow-up:  1. Follow up with PCP at already scheduled appointment.  BP and electrolyte management 2. Please obtain BMP with magnesium and CBC in 1-2 weeks at discretion of PCP 3. Follow up with gastroenterology in 5-6 weeks or sooner as needed for cirrhosis management  Home Health:  home  Equipment/Devices:  Home    Discharge Condition:  Stable, improved CODE STATUS:  full  Diet recommendation:  regular   Brief/Interim Summary:  Jacob NightMichael G Littletonis a 45 y.o.malewith medical history significant of polysubstance abuse including alcohol, tobacco and marijuana, hypertension, COPD, GERD, liver cirrhosis, thrombocytopenia, neuropathy, who presented with RUQ pain.  CT abdomen/pelvis showed new dilatation of the common bile duct to 1.2 cm, with minimal intrahepatic biliary ductal dilatation, gallbladder wall thickening.  GI was consulted.  EGD on 9/20 confirmed portal hypertensive gastropathy, grade 1 esophageal varices, reflux esophagitis, a nonspecific distal gastritis which was biopsied.  None of the findings on EGD were felt to be explanations for his RUQ pain.  MRCP showed stable CBD and no evidence of stones.  Gallbladder wall was mildly thickened but this was attributed to mild ascites from cirrhosis.  HIDA was negative.  His RUQ pain is likely due to hepatitis.    Discharge Diagnoses:  Principal Problem:   Abdominal pain Active Problems:   COPD (chronic obstructive pulmonary disease) (HCC)   Alcoholic cirrhosis of liver (HCC)   Hypokalemia   Essential hypertension, benign   GERD (gastroesophageal reflux disease)   Marijuana abuse   Alcohol abuse   Tobacco abuse   Thrombocytopenia (HCC)   Gastric varices   Common bile duct dilation    Chronic RUQ pain   LFT elevation   Cocaine abuse   Portal hypertensive gastropathy   Esophageal varices without bleeding (HCC)  Right upper quadrant abdominal pain, likely due to EtOH hepatitis -  EGD:  Grade 1 varices, portal hypertensive gastropathy and mild gastritis -  MRCP:  No evidence of bile duct obstruction or stones -  HIDA negative - Omeprazole for his mild gastritis and advised him to continue alcoholic abstinence  Alcoholic cirrhosis of liver and Gastric varices, portal hypertensive gastropathy, splenomegaly.  -Started propranolol which is also for hypertension on 9/19 - Defer initiation of lasix/spironolactone to PCP  Hypokalemia: K= 3.0on admission. - Repleted  Hypomagnesemia, persistently low -  Start oral magnesium repletion -  Repeat Magnesium in 1 week  HTN: bp 141/107. Blood pressure improved -started propranolol as above -IV hydralazine when necessary  GERD: -Protonix  Polysubstance abuse: Including alcohol, tobacco, marijuana. Patient quit drinking about a week prior to admission.  No signs of withdrawal during hospitalization. - Due to counseling about importance of quitting substance. - Nicotine patch - UDS positive for opiates - Rx for one month of thiamine supplementation  Normocytic anemia -  Previously folate deficient:  1 month Rx for folate -  Continued prior to admission iron supplementation  Thrombocytopenia (HCC): Previously 60 which was a 69 on 11/01/15. No active bleeding. Most likely due to liver cirrhosis and splenomegaly. - CBC by PCP at follow up visit   Discharge Instructions  Discharge Instructions    Call MD for:  difficulty breathing, headache or visual disturbances    Complete by:  As directed    Call MD for:  extreme fatigue  Complete by:  As directed    Call MD for:  hives    Complete by:  As directed    Call MD for:  persistant dizziness or light-headedness    Complete by:  As directed    Call MD for:   persistant nausea and vomiting    Complete by:  As directed    Call MD for:  severe uncontrolled pain    Complete by:  As directed    Call MD for:  temperature >100.4    Complete by:  As directed    Diet - low sodium heart healthy    Complete by:  As directed    Increase activity slowly    Complete by:  As directed        Medication List    STOP taking these medications   acetaminophen 500 MG tablet Commonly known as:  TYLENOL     TAKE these medications   ferrous sulfate 325 (65 FE) MG tablet Take 325 mg by mouth daily with breakfast.   folic acid 1 MG tablet Commonly known as:  FOLVITE Take 1 tablet (1 mg total) by mouth daily.   magnesium oxide 400 (241.3 Mg) MG tablet Commonly known as:  MAG-OX Take 1 tablet (400 mg total) by mouth 2 (two) times daily.   omeprazole 20 MG capsule Commonly known as:  PRILOSEC Take 1 capsule (20 mg total) by mouth daily.   potassium chloride SA 20 MEQ tablet Commonly known as:  K-DUR,KLOR-CON Take 20 mEq by mouth 2 (two) times daily.   propranolol ER 60 MG 24 hr capsule Commonly known as:  INDERAL LA Take 1 capsule (60 mg total) by mouth daily. Start taking on:  11/11/2015   thiamine 100 MG tablet Commonly known as:  VITAMIN B-1 Take 1 tablet (100 mg total) by mouth daily.      Follow-up Information    [facility] Follow up on 11/13/2015.   Why:  Appointment at 2:30 PM Monday 11/13/15. Please take discharge papers, all Medications, ID with you to your appointment.   Contact information: [address] [place] [address] [phone]       Jacob Fee, MD Follow up in 5 week(s).   Specialty:  Gastroenterology Why:  follow up cirrhosis Contact information: [address] [place] [address] [phone]          Allergies  Allergen Reactions  . Codeine Nausea And Vomiting  . Nsaids Nausea And Vomiting    Consultations: Gastroenterology, Dr.  Christella Hartigan   Procedures/Studies: Ct Abdomen Pelvis W Wo Contrast  Result Date: 11/07/2015 CLINICAL DATA:  Acute onset of right upper quadrant abdominal pain. Increased urinary frequency. Chronic hepatic cirrhosis. Initial encounter. EXAM: CT ABDOMEN AND PELVIS WITHOUT AND WITH CONTRAST TECHNIQUE: Multidetector CT imaging of the abdomen and pelvis was performed following the standard protocol before and following the bolus administration of intravenous contrast. CONTRAST:  ISOVUE-300 IOPAMIDOL (ISOVUE-300) INJECTION 61% COMPARISON:  CT of the abdomen and pelvis from 02/10/2012, and abdominal ultrasound performed 11/01/2015 FINDINGS: Lower chest: The visualized lung bases are grossly clear. The visualized portions of the mediastinum are unremarkable. Hepatobiliary: There is a mildly nodular contour to the liver, concerning for hepatic cirrhosis. Small nonspecific hypodensities are noted within the liver, measuring up to 1.0 cm in size. Gastric and splenic varices are noted. One of the gastric varices is significantly enlarged, extending into the wall of the gastric fundus. Wall thickening is noted  with regard to the gallbladder, with mild surrounding soft tissue inflammation. There is dilatation of the common bile duct to 1.2 cm, new from the prior study, with minimal intrahepatic biliary ductal dilatation. Pancreas: The pancreas is within normal limits. Spleen: The spleen is enlarged, measuring 14.1 cm in length. Adrenals/Urinary Tract: The adrenal glands are unremarkable in appearance. The kidneys are within normal limits. There is no evidence of hydronephrosis. No renal or ureteral stones are identified. No perinephric stranding is seen. Stomach/Bowel: The stomach is unremarkable in appearance. The small bowel is within normal limits. The appendix is normal in caliber, without evidence of appendicitis. The colon is unremarkable in appearance. Vascular/Lymphatic: Scattered calcification is seen along the  abdominal aorta and its branches. The abdominal aorta is otherwise grossly unremarkable. The inferior vena cava is grossly unremarkable. No retroperitoneal lymphadenopathy is seen. No pelvic sidewall lymphadenopathy is identified. Reproductive: The bladder is moderately distended and within normal limits. The uterus is grossly unremarkable in appearance. The ovaries are relatively symmetric. No suspicious adnexal masses are seen. Other: No additional soft tissue abnormalities are seen. Musculoskeletal: No acute osseous abnormalities are identified. Vacuum phenomenon is noted at the lower lumbar spine. The visualized musculature is unremarkable in appearance. IMPRESSION: 1. New dilatation of the common bile duct to 1.2 cm, with minimal intrahepatic biliary ductal dilatation. Wall thickening noted with regard to the gallbladder, with mild surrounding soft tissue inflammation, raising question for mild cholecystitis. ERCP or MRCP could be considered for further evaluation, to assess for underlying stone. 2. Gastric and splenic varices noted. One of the gastric varices is significantly enlarged, extending into the wall of the gastric fundus. 3. Mildly nodular contour to the liver is concerning for hepatic cirrhosis. Nonspecific small hypodensities within the liver measure up to 1.0 cm in size. 4. Splenomegaly. 5. Scattered calcification along the abdominal aorta and its branches. Electronically Signed   By: Roanna Raider M.D.   On: 11/07/2015 02:44   US Abdomen Complete  Result Date: 11/01/2015 CLINICAL DATA:  Worsening right upper quadrant pain radiating to the back. Two days duration. EXAM: ABDOMEN ULTRASOUND COMPLETE COMPARISON:  None. FINDINGS: Gallbladder: Borderline mural thickening, 4 mm. No calculi. No sonographic Murphy sign noted by sonographer. Common bile duct: Diameter: 4 mm Liver: Diffusely coarsened echotexture. Irregular contours consistent with cirrhosis. No suspicious focal liver lesion. IVC: No  abnormality visualized. Pancreas: Visualized portion unremarkable. Spleen: Size and appearance within normal limits. Right Kidney: Length: 12.3 cm. Echogenicity within normal limits. No mass or hydronephrosis visualized. Left Kidney: Length: 12.6 cm. Echogenicity within normal limits. No mass or hydronephrosis visualized. Abdominal aorta: No aneurysm visualized. Other findings: No ascites is evident. IMPRESSION: Cirrhotic appearing liver without suspicious focal lesion. No bile duct dilatation. Borderline gallbladder mural thickening, 4 mm. The patient was not tender to probe pressure over the gallbladder. Electronically Signed   By: Ellery Plunk M.D.   On: 11/01/2015 05:10   Mr 3d Recon At Scanner  Result Date: 11/09/2015 CLINICAL DATA:  45 year old male inpatient with polysubstance abuse, cirrhosis and abdominal pain with biliary ductal dilatation on recent CT study. Upper endoscopy 1 day prior demonstrated portal hypertensive gastropathy, grade 1 esophageal varices, reflux esophagitis and nonspecific distal gastritis. EXAM: MRI ABDOMEN WITHOUT AND WITH CONTRAST (INCLUDING MRCP) TECHNIQUE: Multiplanar multisequence MR imaging of the abdomen was performed both before and after the administration of intravenous contrast. Heavily T2-weighted images of the biliary and pancreatic ducts were obtained, and three-dimensional MRCP images were rendered by post processing. CONTRAST:  15mL MULTIHANCE GADOBENATE DIMEGLUMINE 529 MG/ML IV SOLN COMPARISON:  11/07/2015 CT abdomen.  02/10/2012 CT abdomen/pelvis. FINDINGS: Lower chest: Clear lung bases. Hepatobiliary: There is relative hypertrophy of the lateral segment left liver lobe and relative atrophy of the right liver lobe. There is mild diffuse irregularity of the liver surface. There is patchy confluent lace-like T2 hyperintensity throughout the liver, most prominent in the segment 8 and segment 4a liver, indicating confluent hepatic fibrosis. These findings  indicate cirrhosis. Mild diffuse hepatic steatosis. There are several (at least 5) subcentimeter simple cysts scattered throughout the liver, largest 0.8 cm in the inferior right liver lobe. No suspicious enhancing liver lesions. Nondistended gallbladder with mild diffuse gallbladder wall thickening, no gallstones and no pericholecystic fluid. There is mild central intrahepatic biliary ductal dilatation. Common bile duct diameter 11 mm, stable compared to 02/10/2012 CT. Smooth distal tapering of the common bile duct. No biliary strictures. No choledocholithiasis. No biliary or ampullary mass. Pancreas: No pancreatic mass or duct dilation.  No pancreas divisum. Spleen: Top normal size spleen (craniocaudal splenic length 11.7 cm). No splenic mass. Adrenals/Urinary Tract: Normal adrenals. No hydronephrosis. Simple 0.7 cm renal cyst in the upper left kidney. No suspicious renal mass. Stomach/Bowel: There are moderate varices involving the posterior gastric fundal wall (series 1104/ image 37), which appear to represent a shunt between the splenic and left renal veins. Otherwise grossly normal stomach. Visualized small and large bowel is normal caliber, with no bowel wall thickening. Vascular/Lymphatic: Atherosclerotic nonaneurysmal abdominal aorta. Patent portal, splenic, hepatic and renal veins. Mild esophageal and paraumbilical varices. There is a large portosystemic shunt from the superior mesenteric vein to the dilated right gonadal vein. No pathologically enlarged lymph nodes in the abdomen. Other: No abdominal ascites or focal fluid collection. Musculoskeletal: No aggressive appearing focal osseous lesions. IMPRESSION: 1. Cirrhosis. Scattered tiny and simple liver cysts. No suspicious liver mass. 2. Moderate splenorenal shunt with associated varices involving the posterior gastric fundus wall. Large SMV-right gonadal vein portosystemic shunt. Small esophageal and paraumbilical varices. 3. No ascites.  Top-normal  size spleen. 4. Mild diffuse gallbladder wall thickening. No gallbladder distention. No cholelithiasis. The gallbladder wall thickening is probably due to noninflammatory edema due to the patient's hypoalbuminemia and portal hypertension. 5. Chronic common bile duct dilatation with smooth distal tapering, unchanged back to 2013. No choledocholithiasis. No biliary strictures. No biliary or ampullary mass. Electronically Signed   By: Delbert Phenix M.D.   On: 11/09/2015 09:22   Nm Hepato W/eject Fract  Result Date: 11/10/2015 CLINICAL DATA:  Right upper quadrant pain for several months EXAM: NUCLEAR MEDICINE HEPATOBILIARY IMAGING WITH GALLBLADDER EF TECHNIQUE: Sequential anterior images of the abdomen were obtained out to 60 minutes following intravenous administration of radiopharmaceutical. After oral ingestion of Ensure, gallbladder ejection fraction was determined. At 60 min, normal ejection fraction is greater than 33%. RADIOPHARMACEUTICALS:  5.5 mCi Tc-27m  Choletec IV COMPARISON:  None. FINDINGS: Liver uptake of radiotracer is normal. There is prompt visualization of gallbladder and small bowel, indicating patency of the cystic and common bile ducts. The patient consumed 8 ounces of Ensure Plus orally with calculation of the computer generated ejection fraction of radiotracer from the gallbladder. No report of clinical symptoms with the Ensure Plus consumption. The computer generated ejection fraction of radiotracer from the gallbladder is normal at 76.1%, normal greater than 33 percent using the oral agent. IMPRESSION: Study within normal limits. Electronically Signed   By: Jacob Hill M.D.   On: 11/10/2015 16:25  Mr Roe Coombs W/wo Cm/mrcp  Result Date: 11/09/2015 CLINICAL DATA:  45 year old male inpatient with polysubstance abuse, cirrhosis and abdominal pain with biliary ductal dilatation on recent CT study. Upper endoscopy 1 day prior demonstrated portal hypertensive gastropathy, grade 1  esophageal varices, reflux esophagitis and nonspecific distal gastritis. EXAM: MRI ABDOMEN WITHOUT AND WITH CONTRAST (INCLUDING MRCP) TECHNIQUE: Multiplanar multisequence MR imaging of the abdomen was performed both before and after the administration of intravenous contrast. Heavily T2-weighted images of the biliary and pancreatic ducts were obtained, and three-dimensional MRCP images were rendered by post processing. CONTRAST:  15mL MULTIHANCE GADOBENATE DIMEGLUMINE 529 MG/ML IV SOLN COMPARISON:  11/07/2015 CT abdomen.  02/10/2012 CT abdomen/pelvis. FINDINGS: Lower chest: Clear lung bases. Hepatobiliary: There is relative hypertrophy of the lateral segment left liver lobe and relative atrophy of the right liver lobe. There is mild diffuse irregularity of the liver surface. There is patchy confluent lace-like T2 hyperintensity throughout the liver, most prominent in the segment 8 and segment 4a liver, indicating confluent hepatic fibrosis. These findings indicate cirrhosis. Mild diffuse hepatic steatosis. There are several (at least 5) subcentimeter simple cysts scattered throughout the liver, largest 0.8 cm in the inferior right liver lobe. No suspicious enhancing liver lesions. Nondistended gallbladder with mild diffuse gallbladder wall thickening, no gallstones and no pericholecystic fluid. There is mild central intrahepatic biliary ductal dilatation. Common bile duct diameter 11 mm, stable compared to 02/10/2012 CT. Smooth distal tapering of the common bile duct. No biliary strictures. No choledocholithiasis. No biliary or ampullary mass. Pancreas: No pancreatic mass or duct dilation.  No pancreas divisum. Spleen: Top normal size spleen (craniocaudal splenic length 11.7 cm). No splenic mass. Adrenals/Urinary Tract: Normal adrenals. No hydronephrosis. Simple 0.7 cm renal cyst in the upper left kidney. No suspicious renal mass. Stomach/Bowel: There are moderate varices involving the posterior gastric fundal wall  (series 1104/ image 37), which appear to represent a shunt between the splenic and left renal veins. Otherwise grossly normal stomach. Visualized small and large bowel is normal caliber, with no bowel wall thickening. Vascular/Lymphatic: Atherosclerotic nonaneurysmal abdominal aorta. Patent portal, splenic, hepatic and renal veins. Mild esophageal and paraumbilical varices. There is a large portosystemic shunt from the superior mesenteric vein to the dilated right gonadal vein. No pathologically enlarged lymph nodes in the abdomen. Other: No abdominal ascites or focal fluid collection. Musculoskeletal: No aggressive appearing focal osseous lesions. IMPRESSION: 1. Cirrhosis. Scattered tiny and simple liver cysts. No suspicious liver mass. 2. Moderate splenorenal shunt with associated varices involving the posterior gastric fundus wall. Large SMV-right gonadal vein portosystemic shunt. Small esophageal and paraumbilical varices. 3. No ascites.  Top-normal size spleen. 4. Mild diffuse gallbladder wall thickening. No gallbladder distention. No cholelithiasis. The gallbladder wall thickening is probably due to noninflammatory edema due to the patient's hypoalbuminemia and portal hypertension. 5. Chronic common bile duct dilatation with smooth distal tapering, unchanged back to 2013. No choledocholithiasis. No biliary strictures. No biliary or ampullary mass. Electronically Signed   By: Delbert Phenix M.D.   On: 11/09/2015 09:22    Subjective: Pain is decreasing.  4/10 today.  Denies nausea, vomiting.  Ambulating in halls.    Discharge Exam: Vitals:   11/09/15 2306 11/10/15 0559  BP: 132/75 (!) 130/91  Pulse: (!) 54 77  Resp: 18 18  Temp: 99.1 F (37.3 C) 99.2 F (37.3 C)   Vitals:   11/09/15 0549 11/09/15 1500 11/09/15 2306 11/10/15 0559  BP: 121/79 136/75 132/75 (!) 130/91  Pulse: 72 63 (!) 54 77  Resp: 16 18 18 18   Temp: 99.4 F (37.4 C)  99.1 F (37.3 C) 99.2 F (37.3 C)  TempSrc: Oral Oral Oral  Oral  SpO2: 98% 97% 96% 96%  Weight:      Height:        General exam:  Adult Male.  No acute distress.  HEENT:  NCAT, MMM Respiratory system: Clear to auscultation bilaterally Cardiovascular system: Regular rate and rhythm, normal S1/S2. No murmurs, rubs, gallops or clicks.  Warm extremities Gastrointestinal system: Normal active bowel sounds, soft, nondistended.  Mild to moderate pain in RUQ with palpation during inspiration.  ABdomen otherwise soft and nontender.  MSK:  Normal tone and bulk, no lower extremity edema Neuro:  Grossly intact    The results of significant diagnostics from this hospitalization (including imaging, microbiology, ancillary and laboratory) are listed below for reference.     Microbiology: No results found for this or any previous visit (from the past 240 hour(s)).   Labs: BNP (last 3 results) No results for input(s): BNP in the last 8760 hours. Basic Metabolic Panel:  Recent Labs Lab 11/07/15 0100 11/07/15 0602 11/08/15 0411 11/09/15 0518 11/10/15 0425  NA 138  --  137 139 139  K 3.0*  --  4.1 3.7 3.7  CL 104  --  108 108 105  CO2 27  --  22 26 29   GLUCOSE 142*  --  86 83 98  BUN 8  --  7 10 8   CREATININE 0.92  --  1.03 1.16 1.14  CALCIUM 9.8  --  8.4* 9.0 8.9  MG  --  1.4* 1.5* 1.7 1.3*   Liver Function Tests:  Recent Labs Lab 11/07/15 0100 11/08/15 0411 11/09/15 0518 11/10/15 0425  AST 61* 58* 56* 47*  ALT 30 27 24 23   ALKPHOS 58 49 53 49  BILITOT 2.9* 3.3* 2.3* 1.7*  PROT 7.4 6.8 6.2* 6.0*  ALBUMIN 3.2* 2.9* 2.7* 2.6*    Recent Labs Lab 11/07/15 0100  LIPASE 61*   No results for input(s): AMMONIA in the last 168 hours. CBC:  Recent Labs Lab 11/07/15 0100 11/08/15 0411 11/09/15 0518 11/10/15 0425  WBC 6.1 7.3 6.0 5.3  NEUTROABS 3.3  --   --   --   HGB 13.9 12.9* 12.5* 12.3*  HCT 38.7* 37.7* 35.9* 34.9*  MCV 96.3 100.3* 99.4 98.0  PLT 60* 71* 63* 57*   Cardiac Enzymes: No results for input(s): CKTOTAL,  CKMB, CKMBINDEX, TROPONINI in the last 168 hours. BNP: Invalid input(s): POCBNP CBG:  Recent Labs Lab 11/07/15 0746 11/08/15 0753 11/09/15 0741 11/10/15 0753  GLUCAP 77 84 88 108*   D-Dimer No results for input(s): DDIMER in the last 72 hours. Hgb A1c No results for input(s): HGBA1C in the last 72 hours. Lipid Profile No results for input(s): CHOL, HDL, LDLCALC, TRIG, CHOLHDL, LDLDIRECT in the last 72 hours. Thyroid function studies No results for input(s): TSH, T4TOTAL, T3FREE, THYROIDAB in the last 72 hours.  Invalid input(s): FREET3 Anemia work up No results for input(s): VITAMINB12, FOLATE, FERRITIN, TIBC, IRON, RETICCTPCT in the last 72 hours. Urinalysis    Component Value Date/Time   COLORURINE YELLOW 11/07/2015 0110   APPEARANCEUR CLEAR 11/07/2015 0110   LABSPEC 1.009 11/07/2015 0110   PHURINE 7.0 11/07/2015 0110   GLUCOSEU NEGATIVE 11/07/2015 0110   GLUCOSEU 100 02/05/2012 0947   HGBUR NEGATIVE 11/07/2015 0110   BILIRUBINUR NEGATIVE 11/07/2015 0110   KETONESUR NEGATIVE 11/07/2015 0110   PROTEINUR NEGATIVE 11/07/2015 0110  UROBILINOGEN >=8.0 02/05/2012 0947   NITRITE NEGATIVE 11/07/2015 0110   LEUKOCYTESUR NEGATIVE 11/07/2015 0110   Sepsis Labs Invalid input(s): PROCALCITONIN,  WBC,  LACTICIDVEN   Time coordinating discharge: Over 30 minutes  SIGNED:   Renae Fickle, MD  [facility] 11/10/2015, 4:51 PM Pager   If 7PM-7AM, please contact [place]-coverage [email or web address] Password [email or web address]

## 2015-11-13 ENCOUNTER — Encounter: Payer: Self-pay | Admitting: Licensed Clinical Social Worker

## 2015-11-13 ENCOUNTER — Ambulatory Visit: Payer: Self-pay | Attending: Internal Medicine | Admitting: Physician Assistant

## 2015-11-13 VITALS — BP 143/78 | HR 89 | Temp 98.9°F | Resp 20 | Ht 72.0 in | Wt 175.0 lb

## 2015-11-13 DIAGNOSIS — G629 Polyneuropathy, unspecified: Secondary | ICD-10-CM

## 2015-11-13 DIAGNOSIS — E878 Other disorders of electrolyte and fluid balance, not elsewhere classified: Secondary | ICD-10-CM

## 2015-11-13 DIAGNOSIS — F411 Generalized anxiety disorder: Secondary | ICD-10-CM

## 2015-11-13 DIAGNOSIS — K7030 Alcoholic cirrhosis of liver without ascites: Secondary | ICD-10-CM

## 2015-11-13 MED ORDER — GABAPENTIN 300 MG PO CAPS
300.0000 mg | ORAL_CAPSULE | Freq: Three times a day (TID) | ORAL | 1 refills | Status: DC
Start: 2015-11-13 — End: 2018-06-11

## 2015-11-13 MED ORDER — FERROUS SULFATE 325 (65 FE) MG PO TABS: 325.0000 mg | ORAL_TABLET | Freq: Every day | ORAL | 1 refills | Status: DC

## 2015-11-13 MED ORDER — FOLIC ACID 1 MG PO TABS: 1.0000 mg | ORAL_TABLET | Freq: Every day | ORAL | 1 refills | Status: DC

## 2015-11-13 MED ORDER — MIRTAZAPINE 7.5 MG PO TABS: 7.5000 mg | ORAL_TABLET | Freq: Every day | ORAL | 1 refills | Status: DC

## 2015-11-13 MED ORDER — VITAMIN B-1 100 MG PO TABS: 100.0000 mg | ORAL_TABLET | Freq: Every day | ORAL | 1 refills | Status: DC

## 2015-11-13 MED ORDER — PROPRANOLOL HCL ER 60 MG PO CP24: 60.0000 mg | ORAL_CAPSULE | Freq: Every day | ORAL | 1 refills | Status: DC

## 2015-11-13 MED ORDER — MAGNESIUM OXIDE 400 (241.3 MG) MG PO TABS: 400.0000 mg | ORAL_TABLET | Freq: Two times a day (BID) | ORAL | 1 refills | Status: DC

## 2015-11-13 MED ORDER — OMEPRAZOLE 20 MG PO CPDR: 20.0000 mg | DELAYED_RELEASE_CAPSULE | Freq: Every day | ORAL | 1 refills | Status: DC

## 2015-11-13 MED FILL — FERROUS SULFATE 325 MG TAB: 325 (65 FE) | 30 days supply | Qty: 30 | Fill #0

## 2015-11-13 MED FILL — GABAPENTIN 300 MG CAPSULE: 300 | 30 days supply | Qty: 90 | Fill #0

## 2015-11-13 MED FILL — MIRTAZAPINE 7.5 MG TABLET: 7.5 | 30 days supply | Qty: 30 | Fill #0

## 2015-11-13 NOTE — Progress Notes (Signed)
F/u abd pain. Insomia. Scheduled to see Rob Buntinganiel Jacobs, MD (GI) in 5 weeks.

## 2015-11-13 NOTE — Progress Notes (Signed)
Jacob Hill  JXB:[id number]  [id number]:[id number]  DOB - [birth year]  Chief Complaint  Patient presents with  . Abdominal Pain  . Hospitalization Follow-up       Subjective:   Jacob Hill is a 45 y.o. male here today for establishment of care. He has a hx of cirrhosis, polysubstance abuse, smoking, HTN, COPD, GERD, seizures, and neuropathy. He was hospitalized from 9/19-9/22, 2017 after presenting with RUQ pain. CTabdomen/pelvis showed new dilatation of the common bile duct to 1.2 cm, with minimal intrahepatic biliary ductal dilatation, gallbladder wall thickening. GI was consulted. EGD on 9/20 confirmed portal hypertensive gastropathy, grade 1 esophageal varices, reflux esophagitis, a nonspecific distal gastritis which was biopsied.  None of the findings on EGD were felt to be explanations for his RUQ pain.  MRCP showed stable CBD and no evidence of stones. Gallbladder wall was mildly thickened but this was attributed to mild ascites from cirrhosis. HIDA was negative.  His RUQ pain is likely due to hepatitis.    DC'd on multiple supplements, PPI, Inderal, and FeSo4.  Since discharge he has not been financially able to get any of the meds filled. The RUQ pain persists but is better. 4/10 now. Difficulty getting comfortable to sleep. No N/V/D. No fever. He also complains that his feet are numb and tingly constantly. Some help with Neurontin in the past.    ROS: GEN: denies fever or chills, denies change in weight Skin: denies lesions or rashes HEENT: denies headache, earache, epistaxis, sore throat, or neck pain LUNGS: denies SHOB, dyspnea, PND, orthopnea CV: denies CP or palpitations ABD:+ abd pain, no N or V EXT: denies muscle spasms or swelling; no pain in lower ext, no weakness NEURO: + numbness or tingling, denies sz, stroke or TIA  ALLERGIES: Allergies  Allergen Reactions  . Codeine Nausea And Vomiting  . Nsaids Nausea And Vomiting    PAST MEDICAL  HISTORY: Past Medical History:  Diagnosis Date  . Alcohol abuse   . Cirrhosis, alcoholic (HCC) 2013   seen with mild splenomegaly on CT 2013.  Hep ABC negative in 2013.   . Coagulopathy (HCC) 09/2011   elevated PT/INR  . COPD (chronic obstructive pulmonary disease) (HCC)   . DJD (degenerative joint disease)   . GERD (gastroesophageal reflux disease)   . Hypertension   . Marijuana abuse   . Neuropathy (HCC)   . Opiate dependence (HCC) 2010   treated with Methadone in past.   . Osteoarthritis   . Seizures (HCC)   . Thrombocytopenia (HCC) 09/2013  . Tobacco abuse     PAST SURGICAL HISTORY: Past Surgical History:  Procedure Laterality Date  . ESOPHAGOGASTRODUODENOSCOPY (EGD) WITH PROPOFOL N/A 11/08/2015   Procedure: ESOPHAGOGASTRODUODENOSCOPY (EGD) WITH PROPOFOL;  Surgeon: Rachael Fee, MD;  Location: [facility];  Service: Endoscopy;  Laterality: N/A;  . HEMORRHOID SURGERY      MEDICATIONS AT HOME: Prior to Admission medications   Medication Sig Start Date End Date Taking? Authorizing Provider  ferrous sulfate 325 (65 FE) MG tablet Take 1 tablet (325 mg total) by mouth daily with breakfast. 11/13/15   Vivianne Master, PA-C  folic acid (FOLVITE) 1 MG tablet Take 1 tablet (1 mg total) by mouth daily. 11/13/15   Vivianne Master, PA-C  gabapentin (NEURONTIN) 300 MG capsule Take 1 capsule (300 mg total) by mouth 3 (three) times daily. 11/13/15   Tiffany Netta Cedars, PA-C  magnesium oxide (MAG-OX) 400 (241.3 Mg) MG tablet Take 1 tablet (400 mg total) by  mouth 2 (two) times daily. 11/13/15   Tiffany Netta CedarsS Noel, PA-C  mirtazapine (REMERON) 7.5 MG tablet Take 1 tablet (7.5 mg total) by mouth at bedtime. 11/13/15   Tiffany Netta CedarsS Noel, PA-C  omeprazole (PRILOSEC) 20 MG capsule Take 1 capsule (20 mg total) by mouth daily. 11/13/15   Tiffany Netta CedarsS Noel, PA-C  potassium chloride SA (K-DUR,KLOR-CON) 20 MEQ tablet Take 20 mEq by mouth 2 (two) times daily.    Historical Provider, MD  propranolol ER (INDERAL LA) 60 MG  24 hr capsule Take 1 capsule (60 mg total) by mouth daily. 11/13/15   Vivianne Masteriffany S Noel, PA-C  thiamine (VITAMIN B-1) 100 MG tablet Take 1 tablet (100 mg total) by mouth daily. 11/13/15   Vivianne Masteriffany S Noel, PA-C     Objective:   Vitals:   11/13/15 1432  BP: (!) 143/78  Pulse: 89  Resp: 20  Temp: 98.9 F (37.2 C)  TempSrc: Oral  SpO2: 98%  Weight: 175 lb (79.4 kg)  Height: 6' (1.829 m)    Exam General appearance : Awake, alert, not in any distress. Speech Clear. Not toxic looking HEENT: Atraumatic and Normocephalic, pupils equally reactive to light and accomodation Neck: supple, no JVD. No cervical lymphadenopathy.  Chest:Good air entry bilaterally, no added sounds  CVS: S1 S2 regular, no murmurs.  Abdomen: Bowel sounds present, Non tender and not distended with no gaurding, rigidity or rebound. Extremities: B/L Lower Ext shows no edema, both legs are warm to touch Neurology: Awake alert, and oriented X 3, CN II-XII intact, Non focal Skin:No Rash Wounds:N/A  Data Review Lab Results  Component Value Date   HGBA1C 4.5 (L) 03/31/2012   HGBA1C 4.7 01/06/2009     Assessment & Plan  1. Liver cirrhosis/portal HTN  -abstinence from ETOH  -Inderal   -considered Aldactone  -Make appt with Dr. Christella HartiganJacobs for 4 weeks 2. Anxiety  -Remeron 3. Peripheral Neuropathy  -Neurontin 4. Electrolyte abnormality  -K+, MG+ scripts given  -recheck BMP, Mag at 1 week follow up appt 5. Hx fe def and Pernicious anemia  -replacement  -recheck CBC at 1 week f/u appt 6. Gastritis  -PPI  -GI follow up next mo  SW aware of pt and to inform of various resources/support of the above issues  Return in about 1 week (around 11/20/2015). Will need labs at next visit.  This note has been created with Education officer, environmentalDragon speech recognition software and smart phrase technology. Any transcriptional errors are unintentional.    Scot Juniffany Noel, PA-C [facility] [place],  [place] [phone]   11/13/2015, 3:33 PM

## 2015-11-23 ENCOUNTER — Ambulatory Visit: Payer: Self-pay | Admitting: Family Medicine

## 2015-12-01 NOTE — Progress Notes (Signed)
Session Start time: 3:05   End Time: 3:35 Total Time:  30 minutes Type of Service: Behavioral Health - Individual/Family Interpreter: No.   Interpreter Name & Language: N/A # [facility] July 2017-June 2018: 1   SUBJECTIVE: Jacob Hill is a 45 y.o. male  Pt. was referred by Zenda AlpersPA-C Noel for:  anxiety and sleep disturbance. Pt. reports the following symptoms/concerns: difficulty sleeping, uncontrollable worry, food insecurity, financial concerns Duration of problem:  5 months Severity: Moderate Previous treatment: Pt has participated in outpatient treatment for mental health and substance abuse. Currently not receiving services   OBJECTIVE: Mood: Anxious & Affect: Appropriate Risk of harm to self or others: Pt denied SI/HI Assessments administered: PHQ-9; GAD-7  LIFE CONTEXT:  Family & Social: Pt resides with Spouse, In-Law, and sibling School/ Work: Pt is unemployed Self-Care: Pt has difficulty sleeping.   Life changes: Pt recently bought a house 6 months ago and has been anxious about finances. 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 Pt recently bought a house 6 months ago and has been anxious about finances. Pt and spouse provide care to elderly parent and sibling with MS Pt is sober from alcohol (2 weeks) LCSWA discussed healthy coping skills to address anxiety and provided information on AA meetings, crisis, and Utility Assistance resources. Pt reported food insecurity and was provided food through program with [employer]. Pt may benefit from psychotherapy and utilizing healthy coping skills to decrease anxiety.      PLAN: 1. F/U with behavioral health clinician:  LCSWA will follow up with pt. Pt was encouraged to contact LCSWA if symptoms worsen or fail to improve to [facility] 2. Behavioral Health meds: Remeron 3. Behavioral recommendations: LCSWA recommends that pt apply healthy coping skills discussed. Pt is encouraged to schedule follow up appointment with LCSWA 4. Referral: Brief Counseling/Psychotherapy, Publishing rights managerCommunity Resource and Supportive Counseling 5. From scale of 1-10, how likely are you to follow plan: 6/10   Jacob Hill, MSW, LCSWA  12/01/2015 3:38 pm  Warmhandoff:   Warm Hand Off Completed.

## 2016-10-22 ENCOUNTER — Other Ambulatory Visit: Payer: Self-pay | Admitting: Physician Assistant

## 2018-05-07 ENCOUNTER — Encounter (HOSPITAL_COMMUNITY): Payer: Self-pay | Admitting: Emergency Medicine

## 2018-05-07 ENCOUNTER — Inpatient Hospital Stay (HOSPITAL_COMMUNITY)
Admission: EM | Admit: 2018-05-07 | Discharge: 2018-05-08 | DRG: 192 | Discharge status: Against Medical Advice | Payer: Self-pay | Attending: Internal Medicine | Admitting: Internal Medicine

## 2018-05-07 ENCOUNTER — Emergency Department (HOSPITAL_COMMUNITY): Payer: Self-pay

## 2018-05-07 DIAGNOSIS — Z886 Allergy status to analgesic agent status: Secondary | ICD-10-CM

## 2018-05-07 DIAGNOSIS — M25512 Pain in left shoulder: Secondary | ICD-10-CM | POA: Diagnosis present

## 2018-05-07 DIAGNOSIS — F1110 Opioid abuse, uncomplicated: Secondary | ICD-10-CM | POA: Diagnosis present

## 2018-05-07 DIAGNOSIS — Z720 Tobacco use: Secondary | ICD-10-CM | POA: Diagnosis present

## 2018-05-07 DIAGNOSIS — F1410 Cocaine abuse, uncomplicated: Secondary | ICD-10-CM | POA: Diagnosis present

## 2018-05-07 DIAGNOSIS — Z885 Allergy status to narcotic agent status: Secondary | ICD-10-CM

## 2018-05-07 DIAGNOSIS — F17210 Nicotine dependence, cigarettes, uncomplicated: Secondary | ICD-10-CM | POA: Diagnosis present

## 2018-05-07 DIAGNOSIS — J441 Chronic obstructive pulmonary disease with (acute) exacerbation: Principal | ICD-10-CM | POA: Diagnosis present

## 2018-05-07 DIAGNOSIS — G629 Polyneuropathy, unspecified: Secondary | ICD-10-CM | POA: Diagnosis present

## 2018-05-07 DIAGNOSIS — D696 Thrombocytopenia, unspecified: Secondary | ICD-10-CM | POA: Diagnosis present

## 2018-05-07 DIAGNOSIS — E876 Hypokalemia: Secondary | ICD-10-CM | POA: Diagnosis present

## 2018-05-07 DIAGNOSIS — I10 Essential (primary) hypertension: Secondary | ICD-10-CM | POA: Diagnosis present

## 2018-05-07 DIAGNOSIS — F1210 Cannabis abuse, uncomplicated: Secondary | ICD-10-CM | POA: Diagnosis present

## 2018-05-07 DIAGNOSIS — Z79899 Other long term (current) drug therapy: Secondary | ICD-10-CM

## 2018-05-07 DIAGNOSIS — Z833 Family history of diabetes mellitus: Secondary | ICD-10-CM

## 2018-05-07 DIAGNOSIS — J4489 Other specified chronic obstructive pulmonary disease: Secondary | ICD-10-CM | POA: Diagnosis present

## 2018-05-07 DIAGNOSIS — K7030 Alcoholic cirrhosis of liver without ascites: Secondary | ICD-10-CM | POA: Diagnosis present

## 2018-05-07 DIAGNOSIS — K219 Gastro-esophageal reflux disease without esophagitis: Secondary | ICD-10-CM | POA: Diagnosis present

## 2018-05-07 DIAGNOSIS — Z8249 Family history of ischemic heart disease and other diseases of the circulatory system: Secondary | ICD-10-CM

## 2018-05-07 DIAGNOSIS — F1910 Other psychoactive substance abuse, uncomplicated: Secondary | ICD-10-CM | POA: Diagnosis present

## 2018-05-07 DIAGNOSIS — J449 Chronic obstructive pulmonary disease, unspecified: Secondary | ICD-10-CM | POA: Diagnosis present

## 2018-05-07 DIAGNOSIS — F1010 Alcohol abuse, uncomplicated: Secondary | ICD-10-CM | POA: Diagnosis present

## 2018-05-07 LAB — CBC
HCT: 46.3 % (ref 39.0–52.0)
Hemoglobin: 16 g/dL (ref 13.0–17.0)
MCH: 33.8 pg (ref 26.0–34.0)
MCHC: 34.6 g/dL (ref 30.0–36.0)
MCV: 97.7 fL (ref 80.0–100.0)
Platelets: 124 10*3/uL — ABNORMAL LOW (ref 150–400)
RBC: 4.74 MIL/uL (ref 4.22–5.81)
RDW: 13.7 % (ref 11.5–15.5)
WBC: 7.1 10*3/uL (ref 4.0–10.5)
nRBC: 0 % (ref 0.0–0.2)

## 2018-05-07 MED ORDER — SODIUM CHLORIDE 0.9% FLUSH: 3.0000 mL | Freq: Once | INTRAVENOUS | Status: DC

## 2018-05-07 NOTE — ED Triage Notes (Signed)
Per EMS, pt from home c/o productive cough X2 weeks, L chest and shoulder pain (w/ deep inhalation).  No fever, travel or potential exposure.  Was given 5 albuterol, 10 morphine, 324 ASA.  Pain only decreased from 10 to 6.  Hypertension at 176/100, heart rate 106.  Initial wheezing in lower lobes, now resolved w/ breathing treatment.

## 2018-05-08 ENCOUNTER — Emergency Department (HOSPITAL_COMMUNITY): Payer: Self-pay

## 2018-05-08 ENCOUNTER — Encounter (HOSPITAL_COMMUNITY): Payer: Self-pay | Admitting: Emergency Medicine

## 2018-05-08 DIAGNOSIS — I10 Essential (primary) hypertension: Secondary | ICD-10-CM

## 2018-05-08 DIAGNOSIS — K219 Gastro-esophageal reflux disease without esophagitis: Secondary | ICD-10-CM

## 2018-05-08 DIAGNOSIS — Z720 Tobacco use: Secondary | ICD-10-CM

## 2018-05-08 DIAGNOSIS — E876 Hypokalemia: Secondary | ICD-10-CM

## 2018-05-08 DIAGNOSIS — J441 Chronic obstructive pulmonary disease with (acute) exacerbation: Principal | ICD-10-CM

## 2018-05-08 DIAGNOSIS — F1910 Other psychoactive substance abuse, uncomplicated: Secondary | ICD-10-CM

## 2018-05-08 DIAGNOSIS — F1010 Alcohol abuse, uncomplicated: Secondary | ICD-10-CM

## 2018-05-08 DIAGNOSIS — J449 Chronic obstructive pulmonary disease, unspecified: Secondary | ICD-10-CM | POA: Diagnosis present

## 2018-05-08 LAB — BASIC METABOLIC PANEL
Anion gap: 19 — ABNORMAL HIGH (ref 5–15)
BUN: 5 mg/dL — ABNORMAL LOW (ref 6–20)
CO2: 21 mmol/L — ABNORMAL LOW (ref 22–32)
Calcium: 9.9 mg/dL (ref 8.9–10.3)
Chloride: 103 mmol/L (ref 98–111)
Creatinine, Ser: 0.68 mg/dL (ref 0.61–1.24)
GFR calc Af Amer: >60 mL/min (ref >60)
GFR calc non Af Amer: >60 mL/min (ref >60)
Glucose, Bld: 89 mg/dL (ref 70–99)
Potassium: 3.4 mmol/L — ABNORMAL LOW (ref 3.5–5.1)
Sodium: 143 mmol/L (ref 135–145)

## 2018-05-08 LAB — CK: Total CK: 129 U/L (ref 49–397)

## 2018-05-08 LAB — RESPIRATORY PANEL BY PCR

## 2018-05-08 LAB — TROPONIN I
Troponin I: <0.03 ng/mL (ref <0.03)
Troponin I: <0.03 ng/mL (ref <0.03)

## 2018-05-08 LAB — I-STAT TROPONIN, ED: Troponin i, poc: 0 ng/mL (ref 0.00–0.08)

## 2018-05-08 LAB — RAPID URINE DRUG SCREEN, HOSP PERFORMED
Amphetamines: NOT DETECTED
Barbiturates: NOT DETECTED
Benzodiazepines: NOT DETECTED
Cocaine: POSITIVE — AB
Opiates: POSITIVE — AB
Tetrahydrocannabinol: POSITIVE — AB

## 2018-05-08 LAB — HEPATIC FUNCTION PANEL
ALT: 43 U/L (ref 0–44)
AST: 64 U/L — ABNORMAL HIGH (ref 15–41)
Albumin: 3.7 g/dL (ref 3.5–5.0)
Alkaline Phosphatase: 42 U/L (ref 38–126)
Bilirubin, Direct: 0.4 mg/dL — ABNORMAL HIGH (ref 0.0–0.2)
Indirect Bilirubin: 0.9 mg/dL (ref 0.3–0.9)
Total Bilirubin: 1.3 mg/dL — ABNORMAL HIGH (ref 0.3–1.2)
Total Protein: 7.8 g/dL (ref 6.5–8.1)

## 2018-05-08 LAB — LACTATE DEHYDROGENASE: LDH: 147 U/L (ref 98–192)

## 2018-05-08 LAB — MAGNESIUM: Magnesium: 2 mg/dL (ref 1.7–2.4)

## 2018-05-08 LAB — INFLUENZA PANEL BY PCR (TYPE A & B)
Influenza A By PCR: NEGATIVE
Influenza B By PCR: NEGATIVE

## 2018-05-08 LAB — HIV ANTIBODY (ROUTINE TESTING W REFLEX): HIV Screen 4th Generation wRfx: NONREACTIVE

## 2018-05-08 LAB — POTASSIUM: Potassium: 3.8 mmol/L (ref 3.5–5.1)

## 2018-05-08 MED ORDER — IOHEXOL 350 MG/ML SOLN
75.0000 mL | Freq: Once | INTRAVENOUS | Status: AC | PRN
  Administered 2018-05-08: 75 mL via INTRAVENOUS

## 2018-05-08 MED ORDER — NICOTINE 21 MG/24HR TD PT24
21.0000 mg | MEDICATED_PATCH | Freq: Every day | TRANSDERMAL | Status: DC
  Administered 2018-05-08: 21 mg via TRANSDERMAL
  Filled 2018-05-08: qty 1

## 2018-05-08 MED ORDER — POTASSIUM CHLORIDE CRYS ER 20 MEQ PO TBCR
40.0000 meq | EXTENDED_RELEASE_TABLET | Freq: Once | ORAL | Status: AC
  Administered 2018-05-08: 40 meq via ORAL
  Filled 2018-05-08: qty 2

## 2018-05-08 MED ORDER — PROPRANOLOL HCL ER 60 MG PO CP24: 60.0000 mg | ORAL_CAPSULE | Freq: Every day | ORAL | Status: DC

## 2018-05-08 MED ORDER — IPRATROPIUM-ALBUTEROL 0.5-2.5 (3) MG/3ML IN SOLN
3.0000 mL | RESPIRATORY_TRACT | Status: DC
  Administered 2018-05-08: 3 mL via RESPIRATORY_TRACT
  Filled 2018-05-08: qty 3

## 2018-05-08 MED ORDER — MORPHINE SULFATE (PF) 2 MG/ML IV SOLN
1.0000 mg | INTRAVENOUS | Status: DC | PRN
  Administered 2018-05-08: 2 mg via INTRAVENOUS
  Filled 2018-05-08: qty 1

## 2018-05-08 MED ORDER — ONDANSETRON HCL 4 MG/2ML IJ SOLN: 4.0000 mg | Freq: Three times a day (TID) | INTRAMUSCULAR | Status: DC | PRN

## 2018-05-08 MED ORDER — KETOROLAC TROMETHAMINE 15 MG/ML IJ SOLN
15.0000 mg | Freq: Once | INTRAMUSCULAR | Status: AC
  Administered 2018-05-08: 15 mg via INTRAVENOUS
  Filled 2018-05-08: qty 1

## 2018-05-08 MED ORDER — ACETAMINOPHEN 325 MG PO TABS
650.0000 mg | ORAL_TABLET | Freq: Four times a day (QID) | ORAL | Status: DC | PRN
  Administered 2018-05-08: 650 mg via ORAL
  Filled 2018-05-08: qty 2

## 2018-05-08 MED ORDER — LORAZEPAM 2 MG/ML IJ SOLN
1.0000 mg | Freq: Four times a day (QID) | INTRAMUSCULAR | Status: DC | PRN
  Administered 2018-05-08: 1 mg via INTRAVENOUS

## 2018-05-08 MED ORDER — LORAZEPAM 2 MG/ML IJ SOLN
0.0000 mg | Freq: Four times a day (QID) | INTRAMUSCULAR | Status: DC
  Administered 2018-05-08: 1 mg via INTRAVENOUS
  Filled 2018-05-08 (×2): qty 1

## 2018-05-08 MED ORDER — THIAMINE HCL 100 MG/ML IJ SOLN: 100.0000 mg | Freq: Every day | INTRAMUSCULAR | Status: DC

## 2018-05-08 MED ORDER — VITAMIN B-1 100 MG PO TABS
100.0000 mg | ORAL_TABLET | Freq: Every day | ORAL | Status: DC
  Administered 2018-05-08: 100 mg via ORAL
  Filled 2018-05-08: qty 1

## 2018-05-08 MED ORDER — FOLIC ACID 1 MG PO TABS
1.0000 mg | ORAL_TABLET | Freq: Every day | ORAL | Status: DC
  Administered 2018-05-08: 1 mg via ORAL
  Filled 2018-05-08: qty 1

## 2018-05-08 MED ORDER — GABAPENTIN 300 MG PO CAPS
300.0000 mg | ORAL_CAPSULE | Freq: Three times a day (TID) | ORAL | Status: DC
  Administered 2018-05-08: 300 mg via ORAL
  Filled 2018-05-08: qty 1

## 2018-05-08 MED ORDER — ALBUTEROL SULFATE HFA 108 (90 BASE) MCG/ACT IN AERS
2.0000 | INHALATION_SPRAY | RESPIRATORY_TRACT | Status: DC | PRN
  Filled 2018-05-08: qty 6.7

## 2018-05-08 MED ORDER — DM-GUAIFENESIN ER 30-600 MG PO TB12: 1.0000 | ORAL_TABLET | Freq: Two times a day (BID) | ORAL | Status: DC | PRN

## 2018-05-08 MED ORDER — AZITHROMYCIN 250 MG PO TABS
500.0000 mg | ORAL_TABLET | Freq: Every day | ORAL | Status: AC
  Administered 2018-05-08: 500 mg via ORAL
  Filled 2018-05-08: qty 2

## 2018-05-08 MED ORDER — HYDRALAZINE HCL 20 MG/ML IJ SOLN
5.0000 mg | INTRAMUSCULAR | Status: DC | PRN
  Administered 2018-05-08: 5 mg via INTRAVENOUS
  Filled 2018-05-08: qty 1

## 2018-05-08 MED ORDER — LIDOCAINE 5 % EX PTCH
1.0000 | MEDICATED_PATCH | CUTANEOUS | Status: DC
  Administered 2018-05-08: 1 via TRANSDERMAL
  Filled 2018-05-08: qty 1

## 2018-05-08 MED ORDER — MAGNESIUM SULFATE 2 GM/50ML IV SOLN
2.0000 g | Freq: Once | INTRAVENOUS | Status: AC
  Administered 2018-05-08: 2 g via INTRAVENOUS
  Filled 2018-05-08: qty 50

## 2018-05-08 MED ORDER — IPRATROPIUM-ALBUTEROL 20-100 MCG/ACT IN AERS
1.0000 | INHALATION_SPRAY | Freq: Four times a day (QID) | RESPIRATORY_TRACT | Status: DC
  Administered 2018-05-08: 1 via RESPIRATORY_TRACT
  Filled 2018-05-08: qty 4

## 2018-05-08 MED ORDER — METHYLPREDNISOLONE SODIUM SUCC 125 MG IJ SOLR
60.0000 mg | Freq: Three times a day (TID) | INTRAMUSCULAR | Status: DC
  Administered 2018-05-08: 60 mg via INTRAVENOUS
  Filled 2018-05-08: qty 2

## 2018-05-08 MED ORDER — IPRATROPIUM-ALBUTEROL 0.5-2.5 (3) MG/3ML IN SOLN
3.0000 mL | Freq: Once | RESPIRATORY_TRACT | Status: AC
  Administered 2018-05-08: 3 mL via RESPIRATORY_TRACT
  Filled 2018-05-08: qty 3

## 2018-05-08 MED ORDER — ALBUTEROL (5 MG/ML) CONTINUOUS INHALATION SOLN
15.0000 mg | INHALATION_SOLUTION | RESPIRATORY_TRACT | Status: DC
  Administered 2018-05-08: 15 mg via RESPIRATORY_TRACT

## 2018-05-08 MED ORDER — ADULT MULTIVITAMIN W/MINERALS CH
1.0000 | ORAL_TABLET | Freq: Every day | ORAL | Status: DC
  Administered 2018-05-08: 1 via ORAL
  Filled 2018-05-08: qty 1

## 2018-05-08 MED ORDER — METHYLPREDNISOLONE SODIUM SUCC 125 MG IJ SOLR
125.0000 mg | Freq: Once | INTRAMUSCULAR | Status: AC
  Administered 2018-05-08: 125 mg via INTRAVENOUS
  Filled 2018-05-08: qty 2

## 2018-05-08 MED ORDER — PANTOPRAZOLE SODIUM 40 MG PO TBEC
40.0000 mg | DELAYED_RELEASE_TABLET | Freq: Every day | ORAL | Status: DC
  Administered 2018-05-08: 40 mg via ORAL
  Filled 2018-05-08: qty 1

## 2018-05-08 MED ORDER — LORAZEPAM 1 MG PO TABS: 1.0000 mg | ORAL_TABLET | Freq: Three times a day (TID) | ORAL | Status: DC | PRN

## 2018-05-08 MED ORDER — ALBUTEROL SULFATE (2.5 MG/3ML) 0.083% IN NEBU: 2.5000 mg | INHALATION_SOLUTION | RESPIRATORY_TRACT | Status: DC | PRN

## 2018-05-08 MED ORDER — POTASSIUM CHLORIDE 20 MEQ/15ML (10%) PO SOLN
40.0000 meq | Freq: Once | ORAL | Status: AC
  Administered 2018-05-08: 40 meq via ORAL
  Filled 2018-05-08: qty 30

## 2018-05-08 MED ORDER — AZITHROMYCIN 250 MG PO TABS: 250.0000 mg | ORAL_TABLET | Freq: Every day | ORAL | Status: DC

## 2018-05-08 MED ORDER — ENOXAPARIN SODIUM 40 MG/0.4ML ~~LOC~~ SOLN: 40.0000 mg | SUBCUTANEOUS | Status: DC

## 2018-05-08 MED ORDER — LORAZEPAM 2 MG/ML IJ SOLN: 0.0000 mg | Freq: Two times a day (BID) | INTRAMUSCULAR | Status: DC

## 2018-05-08 MED ORDER — LORAZEPAM 1 MG PO TABS: 1.0000 mg | ORAL_TABLET | Freq: Four times a day (QID) | ORAL | Status: DC | PRN

## 2018-05-08 MED ORDER — ALBUTEROL (5 MG/ML) CONTINUOUS INHALATION SOLN
INHALATION_SOLUTION | RESPIRATORY_TRACT | Status: AC
  Filled 2018-05-08: qty 20

## 2018-05-08 MED ORDER — KETOROLAC TROMETHAMINE 30 MG/ML IJ SOLN
30.0000 mg | Freq: Three times a day (TID) | INTRAMUSCULAR | Status: DC | PRN
  Administered 2018-05-08: 30 mg via INTRAVENOUS
  Filled 2018-05-08: qty 1

## 2018-05-08 MED ORDER — LORAZEPAM 1 MG PO TABS: 1.0000 mg | ORAL_TABLET | ORAL | Status: DC | PRN

## 2018-05-08 NOTE — ED Provider Notes (Signed)
[facility] EMERGENCY DEPARTMENT Provider Note   CSN: [id number] Arrival date & time: 05/07/18  2302    History   Chief Complaint Chief Complaint  Patient presents with   Cough   Chest Pain    HPI DEMAREON COLDWELL is a 48 y.o. male.     The history is provided by the patient.  Cough  Cough characteristics:  Productive Severity:  Moderate Onset quality:  Gradual Duration:  2 weeks Timing:  Intermittent Progression:  Unchanged Smoker: yes   Context: not animal exposure and not fumes   Relieved by:  Nothing Worsened by:  Nothing Ineffective treatments:  None tried Associated symptoms: chest pain and shortness of breath   Associated symptoms: no chills, no diaphoresis, no ear pain, no eye discharge, no fever, no rash, no rhinorrhea and no wheezing   Associated symptoms comment:  Pain is under the L scapula, Sharp in nature Risk factors: no chemical exposure   Chest Pain  Associated symptoms: cough and shortness of breath   Associated symptoms: no diaphoresis, no fever and no palpitations   Patient with significant smoking history (1.5 PPD for 30 years) now 1/2 PPD. Presents with cough, SOB, wheezing. And sharp pain under his left scapula.  No f/c/r. No travel.  No sick contacts.      Past Medical History:  Diagnosis Date   Alcohol abuse    Cirrhosis, alcoholic (HCC) 2013   seen with mild splenomegaly on CT 2013.  Hep ABC negative in 2013.    Coagulopathy (HCC) 09/2011   elevated PT/INR   COPD (chronic obstructive pulmonary disease) (HCC)    DJD (degenerative joint disease)    GERD (gastroesophageal reflux disease)    Hypertension    Marijuana abuse    Neuropathy    Opiate dependence (HCC) 2010   treated with Methadone in past.    Osteoarthritis    Seizures (HCC)    Thrombocytopenia (HCC) 09/2013   Tobacco abuse     Patient Active Problem List   Diagnosis Date Noted   Polysubstance abuse (HCC) 05/08/2018   Chest pain  05/08/2018   Portal hypertensive gastropathy (HCC)    Esophageal varices without bleeding (HCC)    Abdominal pain 11/07/2015   Thrombocytopenia (HCC) 11/07/2015   Gastric varices 11/07/2015   GERD (gastroesophageal reflux disease)    Marijuana abuse    Alcohol abuse    Tobacco abuse    Common bile duct dilation    Chronic RUQ pain    LFT elevation    Cocaine abuse (HCC)    GAD (generalized anxiety disorder) 05/11/2014   Pernicious anemia 11/03/2013   Essential hypertension, benign 06/30/2012   Hypokalemia 02/06/2012   Alcoholic cirrhosis of liver (HCC) 02/05/2012   Vomiting 02/05/2012   Routine general medical examination at a health care facility 09/09/2011   DJD (degenerative joint disease), ankle and foot 11/08/2010   PERIPHERAL NEUROPATHY 01/06/2009   COPD exacerbation (HCC) 12/23/2007   KIDNEY STONE 12/23/2007   Convulsions (HCC) 12/23/2007    Past Surgical History:  Procedure Laterality Date   ESOPHAGOGASTRODUODENOSCOPY (EGD) WITH PROPOFOL N/A 11/08/2015   Procedure: ESOPHAGOGASTRODUODENOSCOPY (EGD) WITH PROPOFOL;  Surgeon: Rachael Fee, MD;  Location: [facility] END[facility];  Service: Endoscopy;  Laterality: N/A;   HEMORRHOID SURGERY          Home Medications    Prior to Admission medications   Medication Sig Start Date End Date Taking? Authorizing Provider  gabapentin (NEURONTIN) 300 MG capsule Take 1 capsule (300  mg total) by mouth 3 (three) times daily. 11/13/15  Yes Danelle Earthly, Tiffany S, PA-C  ferrous sulfate 325 (65 FE) MG tablet Take 1 tablet (325 mg total) by mouth daily with breakfast. Patient not taking: Reported on 05/08/2018 11/13/15   Vivianne Master, PA-C  folic acid (FOLVITE) 1 MG tablet Take 1 tablet (1 mg total) by mouth daily. Patient not taking: Reported on 05/08/2018 11/13/15   Vivianne Master, PA-C  magnesium oxide (MAG-OX) 400 (241.3 Mg) MG tablet Take 1 tablet (400 mg total) by mouth 2 (two) times daily. Patient not taking:  Reported on 05/08/2018 11/13/15   Vivianne Master, PA-C  mirtazapine (REMERON) 7.5 MG tablet Take 1 tablet (7.5 mg total) by mouth at bedtime. Patient not taking: Reported on 05/08/2018 11/13/15   Vivianne Master, PA-C  omeprazole (PRILOSEC) 20 MG capsule Take 1 capsule (20 mg total) by mouth daily. Patient not taking: Reported on 05/08/2018 11/13/15   Vivianne Master, PA-C  propranolol ER (INDERAL LA) 60 MG 24 hr capsule Take 1 capsule (60 mg total) by mouth daily. Patient not taking: Reported on 05/08/2018 11/13/15   Vivianne Master, PA-C  thiamine (VITAMIN B-1) 100 MG tablet Take 1 tablet (100 mg total) by mouth daily. Patient not taking: Reported on 05/08/2018 11/13/15   Vivianne Master, PA-C    Family History Family History  Problem Relation Age of Onset   Diabetes Other    Hypertension Other    Diabetes Father     Social History Social History   Tobacco Use   Smoking status: Current Every Day Smoker    Packs/day: 0.50    Years: 15.00    Pack years: 7.50    Types: Cigarettes   Smokeless tobacco: Never Used  Substance Use Topics   Alcohol use: Yes    Alcohol/week: 9.0 standard drinks    Types: 9 Cans of beer per week    Comment: No beer in 7 days   Drug use: Yes    Types: Marijuana     Allergies   Codeine and Nsaids   Review of Systems Review of Systems  Constitutional: Negative for chills, diaphoresis and fever.  HENT: Negative for ear pain and rhinorrhea.   Eyes: Negative for discharge.  Respiratory: Positive for cough and shortness of breath. Negative for wheezing.   Cardiovascular: Positive for chest pain. Negative for palpitations and leg swelling.  Skin: Negative for rash.  All other systems reviewed and are negative.    Physical Exam Updated Vital Signs BP (!) 160/98    Pulse (!) 105    Resp 19    SpO2 96%   Physical Exam Vitals signs and nursing note reviewed.  Constitutional:      Appearance: Normal appearance. He is not toxic-appearing.  HENT:      Head: Normocephalic and atraumatic.     Nose: Nose normal.     Mouth/Throat:     Mouth: Mucous membranes are moist.     Pharynx: Oropharynx is clear.  Eyes:     Conjunctiva/sclera: Conjunctivae normal.     Pupils: Pupils are equal, round, and reactive to light.  Neck:     Musculoskeletal: Normal range of motion and neck supple.  Cardiovascular:     Rate and Rhythm: Normal rate and regular rhythm.     Pulses: Normal pulses.     Heart sounds: Normal heart sounds.  Pulmonary:     Effort: Tachypnea present. No respiratory distress.     Breath  sounds: Decreased air movement present. Decreased breath sounds and wheezing present. No rhonchi.  Abdominal:     General: Abdomen is flat. Bowel sounds are normal.     Tenderness: There is no abdominal tenderness. There is no guarding or rebound.  Musculoskeletal: Normal range of motion.        General: No deformity.     Left lower leg: No edema.  Skin:    General: Skin is warm and dry.     Capillary Refill: Capillary refill takes less than 2 seconds.     Findings: No rash.  Neurological:     General: No focal deficit present.     Mental Status: He is alert and oriented to person, place, and time.     Deep Tendon Reflexes: Reflexes normal.  Psychiatric:        Mood and Affect: Mood normal.        Behavior: Behavior normal.      ED Treatments / Results  Labs (all labs ordered are listed, but only abnormal results are displayed) Results for orders placed or performed during the hospital encounter of 05/07/18  Basic metabolic panel  Result Value Ref Range   Sodium 143 135 - 145 mmol/L   Potassium 3.4 (L) 3.5 - 5.1 mmol/L   Chloride 103 98 - 111 mmol/L   CO2 21 (L) 22 - 32 mmol/L   Glucose, Bld 89 70 - 99 mg/dL   BUN 5 (L) 6 - 20 mg/dL   Creatinine, Ser 2.950.68 0.61 - 1.24 mg/dL   Calcium 9.9 8.9 - 62.110.3 mg/dL   GFR calc non Af Amer >60 >60 mL/min   GFR calc Af Amer >60 >60 mL/min   Anion gap 19 (H) 5 - 15  CBC  Result Value Ref  Range   WBC 7.1 4.0 - 10.5 K/uL   RBC 4.74 4.22 - 5.81 MIL/uL   Hemoglobin 16.0 13.0 - 17.0 g/dL   HCT 30.846.3 65.739.0 - 84.652.0 %   MCV 97.7 80.0 - 100.0 fL   MCH 33.8 26.0 - 34.0 pg   MCHC 34.6 30.0 - 36.0 g/dL   RDW 96.213.7 95.211.5 - 84.115.5 %   Platelets 124 (L) 150 - 400 K/uL   nRBC 0.0 0.0 - 0.2 %  I-stat troponin, ED  Result Value Ref Range   Troponin i, poc 0.00 0.00 - 0.08 ng/mL   Comment 3           Dg Chest 2 View  Result Date: 05/07/2018 CLINICAL DATA:  Per EMS, pt from home c/o productive cough X2 weeks, L chest and shoulder pain (w/ deep inhalation). No fever, travel or potential exposure. Was given 5 albuterol, 10 morphine, 324 ASA. Pain only decreased from 10 to 6. Hypertension at 176/100, heart rate 106. Initial wheezing in lower lobes, now resolved w/ breathing treatment EXAM: CHEST - 2 VIEW COMPARISON:  02/05/2012 FINDINGS: Cardiac silhouette is normal in size and configuration. Normal mediastinal and hilar contours. Lungs show prominent bronchovascular markings increased compared to the prior radiographs. Lungs otherwise clear. No pleural effusion or pneumothorax. Skeletal structures are intact. IMPRESSION: No active cardiopulmonary disease. Electronically Signed   By: Amie Portlandavid  Ormond M.D.   On: 05/07/2018 23:46   Ct Angio Chest Pe W And/or Wo Contrast  Result Date: 05/08/2018 CLINICAL DATA:  Short of breath, cough and chest pain. EXAM: CT ANGIOGRAPHY CHEST WITH CONTRAST TECHNIQUE: Multidetector CT imaging of the chest was performed using the standard protocol during bolus administration of intravenous contrast.  Multiplanar CT image reconstructions and MIPs were obtained to evaluate the vascular anatomy. CONTRAST:  68 mL OMNIPAQUE IOHEXOL 350 MG/ML SOLN COMPARISON:  Current chest radiographs. FINDINGS: Cardiovascular: There is satisfactory opacification of the pulmonary arteries to the segmental level. No evidence of a pulmonary embolism. Heart is normal in size. Minor 2 vessel coronary artery  calcifications. No pericardial effusion. Great vessels are normal in caliber. No aortic dissection or atherosclerosis. Mediastinum/Nodes: No enlarged mediastinal, hilar, or axillary lymph nodes. Thyroid gland, trachea, and esophagus demonstrate no significant findings. Lungs/Pleura: Bronchial wall thickening noted in the lower lobes with several segmental bronchi demonstrating mucous plugging. Lungs are clear. No pleural effusion or pneumothorax. Upper Abdomen: No acute abnormality. Musculoskeletal: No fracture or acute finding. No osteoblastic or osteolytic lesions. Minor disc degenerative changes along the thoracic spine. Review of the MIP images confirms the above findings. IMPRESSION: 1. No evidence of a pulmonary embolism. 2. Lower lobe bronchial wall thickening with a few lower lobe segmental bronchi showing mucous plugging. This suggests active bronchial inflammation. 3. No evidence of pneumonia or pulmonary edema. Electronically Signed   By: Amie Portland M.D.   On: 05/08/2018 03:41    EKG EKG Interpretation  Date/Time:  Thursday May 07 2018 23:11:02 EDT Ventricular Rate:  105 PR Interval:  142 QRS Duration: 92 QT Interval:  364 QTC Calculation: 481 R Axis:   70 Text Interpretation:  Sinus tachycardia Confirmed by Tomie Spizzirri ([id number]) on 05/08/2018 2:56:30 AM   Radiology Dg Chest 2 View  Result Date: 05/07/2018 CLINICAL DATA:  Per EMS, pt from home c/o productive cough X2 weeks, L chest and shoulder pain (w/ deep inhalation). No fever, travel or potential exposure. Was given 5 albuterol, 10 morphine, 324 ASA. Pain only decreased from 10 to 6. Hypertension at 176/100, heart rate 106. Initial wheezing in lower lobes, now resolved w/ breathing treatment EXAM: CHEST - 2 VIEW COMPARISON:  02/05/2012 FINDINGS: Cardiac silhouette is normal in size and configuration. Normal mediastinal and hilar contours. Lungs show prominent bronchovascular markings increased compared to the prior radiographs.  Lungs otherwise clear. No pleural effusion or pneumothorax. Skeletal structures are intact. IMPRESSION: No active cardiopulmonary disease. Electronically Signed   By: Amie Portland M.D.   On: 05/07/2018 23:46   Ct Angio Chest Pe W And/or Wo Contrast  Result Date: 05/08/2018 CLINICAL DATA:  Short of breath, cough and chest pain. EXAM: CT ANGIOGRAPHY CHEST WITH CONTRAST TECHNIQUE: Multidetector CT imaging of the chest was performed using the standard protocol during bolus administration of intravenous contrast. Multiplanar CT image reconstructions and MIPs were obtained to evaluate the vascular anatomy. CONTRAST:  68 mL OMNIPAQUE IOHEXOL 350 MG/ML SOLN COMPARISON:  Current chest radiographs. FINDINGS: Cardiovascular: There is satisfactory opacification of the pulmonary arteries to the segmental level. No evidence of a pulmonary embolism. Heart is normal in size. Minor 2 vessel coronary artery calcifications. No pericardial effusion. Great vessels are normal in caliber. No aortic dissection or atherosclerosis. Mediastinum/Nodes: No enlarged mediastinal, hilar, or axillary lymph nodes. Thyroid gland, trachea, and esophagus demonstrate no significant findings. Lungs/Pleura: Bronchial wall thickening noted in the lower lobes with several segmental bronchi demonstrating mucous plugging. Lungs are clear. No pleural effusion or pneumothorax. Upper Abdomen: No acute abnormality. Musculoskeletal: No fracture or acute finding. No osteoblastic or osteolytic lesions. Minor disc degenerative changes along the thoracic spine. Review of the MIP images confirms the above findings. IMPRESSION: 1. No evidence of a pulmonary embolism. 2. Lower lobe bronchial wall thickening with a few lower lobe  segmental bronchi showing mucous plugging. This suggests active bronchial inflammation. 3. No evidence of pneumonia or pulmonary edema. Electronically Signed   By: Amie Portland M.D.   On: 05/08/2018 03:41    Procedures Procedures  (including critical care time)  Medications Ordered in ED Medications  sodium chloride flush (NS) 0.9 % injection 3 mL (has no administration in time range)  lidocaine (LIDODERM) 5 % 1 patch (1 patch Transdermal Patch Applied 05/08/18 0422)  albuterol (PROVENTIL, VENTOLIN) (5 MG/ML) 0.5% continuous inhalation solution (  Canceled Entry 05/08/18 0510)  albuterol (PROVENTIL,VENTOLIN) solution continuous neb (15 mg Nebulization New Bag/Given 05/08/18 0510)  magnesium sulfate IVPB 2 g 50 mL (2 g Intravenous New Bag/Given 05/08/18 0537)  iohexol (OMNIPAQUE) 350 MG/ML injection 75 mL (75 mLs Intravenous Contrast Given 05/08/18 0317)  methylPREDNISolone sodium succinate (SOLU-MEDROL) 125 mg/2 mL injection 125 mg (125 mg Intravenous Given 05/08/18 0424)  ipratropium-albuterol (DUONEB) 0.5-2.5 (3) MG/3ML nebulizer solution 3 mL (3 mLs Nebulization Given 05/08/18 0427)  ketorolac (TORADOL) 15 MG/ML injection 15 mg (15 mg Intravenous Given 05/08/18 0424)    MDM Reviewed: nursing note and vitals Interpretation: labs, ECG, x-ray and CT scan (negative troponin) Total time providing critical care: 30-74 minutes (continuous nebs). This excludes time spent performing separately reportable procedures and services. Consults: admitting MD  CRITICAL CARE Performed by: Ford Peddie K Kito Cuffe-Rasch Total critical care time: 60 minutes Critical care time was exclusive of separately billable procedures and treating other patients. Critical care was necessary to treat or prevent imminent or life-threatening deterioration. Critical care was time spent personally by me on the following activities: development of treatment plan with patient and/or surrogate as well as nursing, discussions with consultants, evaluation of patient's response to treatment, examination of patient, obtaining history from patient or surrogate, ordering and performing treatments and interventions, ordering and review of laboratory studies, ordering and  review of radiographic studies, pulse oximetry and re-evaluation of patient's condition.  Final Clinical Impressions(s) / ED Diagnoses   Final diagnoses:  Chronic obstructive pulmonary disease, unspecified COPD type (HCC)    Will admit to medicine for COPD exacerbation.     Shakeerah Gradel, MD 05/08/18 [phone]

## 2018-05-08 NOTE — ED Notes (Signed)
Admitting at bedside 

## 2018-05-08 NOTE — Progress Notes (Signed)
Jacob Hill is a 48 y.o. male with medical history significant of hypertension, COPD, GERD, polysubstance abuse (alcohol, tobacco, marijuana, cocaine, opiates), alcoholic liver cirrhosis, thrombocytopenia, who presents with cough and shortness of breath. CT angiogram is negative for PE, no signs of pneumonia, but showed bronchial mucous plug. Patient is placed on telemetry bed for observation.  1. COPD exacerbation.  Continue with IV steroids and duonebs, on antibiotics empirically.   2. Replace potassium   3. Get magnesium level.   4. Polysubstance abuse:  Get UDS.     Kathlen Mody, MD  [phone]

## 2018-05-08 NOTE — ED Notes (Signed)
ED TO INPATIENT HANDOFF REPORT  ED Nurse Name and Phone #: [phone]  S Name/Age/Gender Jacob Hill 48 y.o. male Room/Bed: 024C/024C  Code Status   Code Status: Full Code  Home/SNF/Other Home Patient oriented to: self, place, time and situation Is this baseline? Yes   Triage Complete: Triage complete  Chief Complaint SHOB, CP  Triage Note Per EMS, pt from home c/o productive cough X2 weeks, L chest and shoulder pain (w/ deep inhalation).  No fever, travel or potential exposure.  Was given 5 albuterol, 10 morphine, 324 ASA.  Pain only decreased from 10 to 6.  Hypertension at 176/100, heart rate 106.  Initial wheezing in lower lobes, now resolved w/ breathing treatment.   Allergies Allergies  Allergen Reactions  . Codeine Nausea And Vomiting  . Nsaids Nausea And Vomiting    Level of Care/Admitting Diagnosis ED Disposition    ED Disposition Condition Comment   Admit  Hospital Area: [facility] [[id number]]  Level of Care: Telemetry Medical [104]  I expect the patient will be discharged within 24 hours: No (not a candidate for 5C-Observation unit)  Diagnosis: COPD exacerbation [facility]) [[id number]]  Admitting Physician: Lorretta Harp [[id number]]  Attending Physician: Lorretta Harp [[id number]]  PT Class (Do Not Modify): Observation [104]  PT Acc Code (Do Not Modify): Observation [10022]       B Medical/Surgery History Past Medical History:  Diagnosis Date  . Alcohol abuse   . Cirrhosis, alcoholic (HCC) 2013   seen with mild splenomegaly on CT 2013.  Hep ABC negative in 2013.   . Coagulopathy (HCC) 09/2011   elevated PT/INR  . COPD (chronic obstructive pulmonary disease) (HCC)   . DJD (degenerative joint disease)   . GERD (gastroesophageal reflux disease)   . Hypertension   . Marijuana abuse   . Neuropathy   . Opiate dependence (HCC) 2010   treated with Methadone in past.   . Osteoarthritis   . Seizures (HCC)   . Thrombocytopenia (HCC) 09/2013  . Tobacco  abuse    Past Surgical History:  Procedure Laterality Date  . ESOPHAGOGASTRODUODENOSCOPY (EGD) WITH PROPOFOL N/A 11/08/2015   Procedure: ESOPHAGOGASTRODUODENOSCOPY (EGD) WITH PROPOFOL;  Surgeon: Rachael Fee, MD;  Location: [facility];  Service: Endoscopy;  Laterality: N/A;  . HEMORRHOID SURGERY       A IV Location/Drains/Wounds Patient Lines/Drains/Airways Status   Active Line/Drains/Airways    Name:   Placement date:   Placement time:   Site:   Days:   Peripheral IV 11/07/15 Right;Medial Forearm   11/07/15    1310    Forearm   913   Peripheral IV 05/07/18 Left Antecubital   05/07/18    2315    Antecubital   1   Airway   11/08/15    1344     912          Intake/Output Last 24 hours No intake or output data in the 24 hours ending 05/08/18 0645  Labs/Imaging Results for orders placed or performed during the hospital encounter of 05/07/18 (from the past 48 hour(s))  Basic metabolic panel     Status: Abnormal   Collection Time: 05/07/18 11:17 PM  Result Value Ref Range   Sodium 143 135 - 145 mmol/L   Potassium 3.4 (L) 3.5 - 5.1 mmol/L   Chloride 103 98 - 111 mmol/L   CO2 21 (L) 22 - 32 mmol/L   Glucose, Bld 89 70 - 99 mg/dL   BUN 5 (L) 6 -  20 mg/dL   Creatinine, Ser 1.61 0.61 - 1.24 mg/dL   Calcium 9.9 8.9 - 09.6 mg/dL   GFR calc non Af Amer >60 >60 mL/min   GFR calc Af Amer >60 >60 mL/min   Anion gap 19 (H) 5 - 15    Comment: Performed at [facility], [address] [place], [place] [address]  CBC     Status: Abnormal   Collection Time: 05/07/18 11:17 PM  Result Value Ref Range   WBC 7.1 4.0 - 10.5 K/uL   RBC 4.74 4.22 - 5.81 MIL/uL   Hemoglobin 16.0 13.0 - 17.0 g/dL   HCT 98.1 19.1 - 47.8 %   MCV 97.7 80.0 - 100.0 fL   MCH 33.8 26.0 - 34.0 pg   MCHC 34.6 30.0 - 36.0 g/dL   RDW 29.5 62.1 - 30.8 %   Platelets 124 (L) 150 - 400 K/uL   nRBC 0.0 0.0 - 0.2 %    Comment: Performed at [facility], [address], [place], [place] [address]  I-stat  troponin, ED     Status: None   Collection Time: 05/08/18  3:45 AM  Result Value Ref Range   Troponin i, poc 0.00 0.00 - 0.08 ng/mL   Comment 3            Comment: Due to the release kinetics of cTnI, a negative result within the first hours of the onset of symptoms does not rule out myocardial infarction with certainty. If myocardial infarction is still suspected, repeat the test at appropriate intervals.    Dg Chest 2 View  Result Date: 05/07/2018 CLINICAL DATA:  Per EMS, pt from home c/o productive cough X2 weeks, L chest and shoulder pain (w/ deep inhalation). No fever, travel or potential exposure. Was given 5 albuterol, 10 morphine, 324 ASA. Pain only decreased from 10 to 6. Hypertension at 176/100, heart rate 106. Initial wheezing in lower lobes, now resolved w/ breathing treatment EXAM: CHEST - 2 VIEW COMPARISON:  02/05/2012 FINDINGS: Cardiac silhouette is normal in size and configuration. Normal mediastinal and hilar contours. Lungs show prominent bronchovascular markings increased compared to the prior radiographs. Lungs otherwise clear. No pleural effusion or pneumothorax. Skeletal structures are intact. IMPRESSION: No active cardiopulmonary disease. Electronically Signed   By: Amie Portland M.D.   On: 05/07/2018 23:46   Ct Angio Chest Pe W And/or Wo Contrast  Result Date: 05/08/2018 CLINICAL DATA:  Short of breath, cough and chest pain. EXAM: CT ANGIOGRAPHY CHEST WITH CONTRAST TECHNIQUE: Multidetector CT imaging of the chest was performed using the standard protocol during bolus administration of intravenous contrast. Multiplanar CT image reconstructions and MIPs were obtained to evaluate the vascular anatomy. CONTRAST:  68 mL OMNIPAQUE IOHEXOL 350 MG/ML SOLN COMPARISON:  Current chest radiographs. FINDINGS: Cardiovascular: There is satisfactory opacification of the pulmonary arteries to the segmental level. No evidence of a pulmonary embolism. Heart is normal in size. Minor 2 vessel  coronary artery calcifications. No pericardial effusion. Great vessels are normal in caliber. No aortic dissection or atherosclerosis. Mediastinum/Nodes: No enlarged mediastinal, hilar, or axillary lymph nodes. Thyroid gland, trachea, and esophagus demonstrate no significant findings. Lungs/Pleura: Bronchial wall thickening noted in the lower lobes with several segmental bronchi demonstrating mucous plugging. Lungs are clear. No pleural effusion or pneumothorax. Upper Abdomen: No acute abnormality. Musculoskeletal: No fracture or acute finding. No osteoblastic or osteolytic lesions. Minor disc degenerative changes along the thoracic spine. Review of the MIP images confirms the above  findings. IMPRESSION: 1. No evidence of a pulmonary embolism. 2. Lower lobe bronchial wall thickening with a few lower lobe segmental bronchi showing mucous plugging. This suggests active bronchial inflammation. 3. No evidence of pneumonia or pulmonary edema. Electronically Signed   By: Amie [place] M.D.   On: 05/08/2018 03:41    Pending Labs Unresulted Labs (From admission, onward)    Start     Ordered   05/08/18 0643  Hepatic function panel  Add-on,   R     05/08/18 0642   05/08/18 0624  HIV antibody (Routine Testing)  Once,   R     05/08/18 0624   05/08/18 0623  Rapid urine drug screen (hospital performed)  Once,   R     05/08/18 0622   05/08/18 0620  Influenza panel by PCR (type A & B)  Once,   R     05/08/18 0619          Vitals/Pain Today's Vitals   05/08/18 0345 05/08/18 0445 05/08/18 0511 05/08/18 0545  BP: (!) 163/95 (!) 139/91 (!) 163/92 (!) 160/98  Pulse: 94 99 86 (!) 105  Resp: 13 19    SpO2: 95% 94% 96% 96%  PainSc:        Isolation Precautions No active isolations  Medications Medications  sodium chloride flush (NS) 0.9 % injection 3 mL (has no administration in time range)  lidocaine (LIDODERM) 5 % 1 patch (1 patch Transdermal Patch Applied 05/08/18 0422)  albuterol (PROVENTIL, VENTOLIN)  (5 MG/ML) 0.5% continuous inhalation solution (  Canceled Entry 05/08/18 0510)  ipratropium-albuterol (DUONEB) 0.5-2.5 (3) MG/3ML nebulizer solution 3 mL (has no administration in time range)  pantoprazole (PROTONIX) EC tablet 40 mg (has no administration in time range)  gabapentin (NEURONTIN) capsule 300 mg (has no administration in time range)  albuterol (PROVENTIL) (2.5 MG/3ML) 0.083% nebulizer solution 2.5 mg (has no administration in time range)  dextromethorphan-guaiFENesin (MUCINEX DM) 30-600 MG per 12 hr tablet 1 tablet (has no administration in time range)  methylPREDNISolone sodium succinate (SOLU-MEDROL) 125 mg/2 mL injection 60 mg (has no administration in time range)  azithromycin (ZITHROMAX) tablet 500 mg (has no administration in time range)    Followed by  azithromycin (ZITHROMAX) tablet 250 mg (has no administration in time range)  potassium chloride 20 MEQ/15ML (10%) solution 40 mEq (has no administration in time range)  ketorolac (TORADOL) 30 MG/ML injection 30 mg (has no administration in time range)  nicotine (NICODERM CQ - dosed in mg/24 hours) patch 21 mg (has no administration in time range)  LORazepam (ATIVAN) tablet 1 mg (has no administration in time range)    Or  LORazepam (ATIVAN) injection 1 mg (has no administration in time range)  thiamine (VITAMIN B-1) tablet 100 mg (has no administration in time range)    Or  thiamine (B-1) injection 100 mg (has no administration in time range)  folic acid (FOLVITE) tablet 1 mg (has no administration in time range)  multivitamin with minerals tablet 1 tablet (has no administration in time range)  LORazepam (ATIVAN) injection 0-4 mg (has no administration in time range)    Followed by  LORazepam (ATIVAN) injection 0-4 mg (has no administration in time range)  enoxaparin (LOVENOX) injection 40 mg (has no administration in time range)  iohexol (OMNIPAQUE) 350 MG/ML injection 75 mL (75 mLs Intravenous Contrast Given 05/08/18 0317)   methylPREDNISolone sodium succinate (SOLU-MEDROL) 125 mg/2 mL injection 125 mg (125 mg Intravenous Given 05/08/18 0424)  ipratropium-albuterol (DUONEB) 0.5-2.5 (  3) MG/3ML nebulizer solution 3 mL (3 mLs Nebulization Given 05/08/18 0427)  ketorolac (TORADOL) 15 MG/ML injection 15 mg (15 mg Intravenous Given 05/08/18 0424)  magnesium sulfate IVPB 2 g 50 mL (2 g Intravenous New Bag/Given 05/08/18 0537)    Mobility walks Low fall risk   Focused Assessments Pulmonary Assessment Handoff:  Lung sounds: Bilateral Breath Sounds: Expiratory wheezes O2 Device: Aerosol Mask        R Recommendations: See Admitting Provider Note  Report given to:   Additional Notes: N/A

## 2018-05-08 NOTE — H&P (Signed)
History and Physical    Jacob Hill WJX:[id number] DOB: 20-Apr-1970 DOA: 05/07/2018  Referring MD/NP/PA:   PCP: Etta Grandchild, MD   Patient coming from:  The patient is coming from home.  At baseline, pt is independent for most of ADL.        Chief Complaint: Cough, shortness of breath  HPI: Jacob GEHRES is a 48 y.o. male with medical history significant of hypertension, COPD, GERD, polysubstance abuse (alcohol, tobacco, marijuana, cocaine, opiates), alcoholic liver cirrhosis, thrombocytopenia, who presents with cough and shortness of breath.  Patient states that he has been having cough and shortness breath for more than 2 weeks, which has been progressively worsening.  He coughs up a lot of white sputum.  Denies fever, chills, runny nose or sore throat.  Patient reports pain in the left scapular blade area, which is constant, 7 out of 10 in severity, sharp, aggravated by coughing.  No injury.  Patient denies chest pain.  No tenderness in the calf areas.  No recent traveling or risky exposure to COVID19.  Patient does not have nausea, vomiting, diarrhea, abdominal pain, symptoms of UTI or unilateral weakness.  ED Course: pt was found to have WBC 7.1, negative troponin, potassium 3.4, renal function okay, temperature normal, tachycardia, tachypnea, no oxygen desaturation in ED.  Chest x-ray negative.  CT angiogram is negative for PE, no signs of pneumonia, but showed bronchial mucous plug. Patient is placed on telemetry bed for observation.  Review of Systems:   General: no fevers, chills, no body weight gain, has fatigue HEENT: no blurry vision, hearing changes or sore throat Respiratory: no dyspnea, coughing, wheezing CV: no chest pain, no palpitations GI: no nausea, vomiting, abdominal pain, diarrhea, constipation GU: no dysuria, burning on urination, increased urinary frequency, hematuria  Ext: no leg edema Neuro: no unilateral weakness, numbness, or tingling, no vision  change or hearing loss Skin: no rash, no skin tear. MSK: has left scapular blade pain. Heme: No easy bruising.  Travel history: No recent long distant travel.  Allergy:  Allergies  Allergen Reactions  . Codeine Nausea And Vomiting  . Nsaids Nausea And Vomiting    Past Medical History:  Diagnosis Date  . Alcohol abuse   . Cirrhosis, alcoholic (HCC) 2013   seen with mild splenomegaly on CT 2013.  Hep ABC negative in 2013.   . Coagulopathy (HCC) 09/2011   elevated PT/INR  . COPD (chronic obstructive pulmonary disease) (HCC)   . DJD (degenerative joint disease)   . GERD (gastroesophageal reflux disease)   . Hypertension   . Marijuana abuse   . Neuropathy   . Opiate dependence (HCC) 2010   treated with Methadone in past.   . Osteoarthritis   . Seizures (HCC)   . Thrombocytopenia (HCC) 09/2013  . Tobacco abuse     Past Surgical History:  Procedure Laterality Date  . ESOPHAGOGASTRODUODENOSCOPY (EGD) WITH PROPOFOL N/A 11/08/2015   Procedure: ESOPHAGOGASTRODUODENOSCOPY (EGD) WITH PROPOFOL;  Surgeon: Rachael Fee, MD;  Location: [facility];  Service: Endoscopy;  Laterality: N/A;  . HEMORRHOID SURGERY      Social History:  reports that he has been smoking cigarettes. He has a 7.50 pack-year smoking history. He has never used smokeless tobacco. He reports current alcohol use of about 9.0 standard drinks of alcohol per week. He reports current drug use. Drug: Marijuana.  Family History:  Family History  Problem Relation Age of Onset  . Diabetes Other   . Hypertension Other   .  Diabetes Father      Prior to Admission medications   Medication Sig Start Date End Date Taking? Authorizing Provider  gabapentin (NEURONTIN) 300 MG capsule Take 1 capsule (300 mg total) by mouth 3 (three) times daily. 11/13/15  Yes Danelle Earthly, Tiffany S, PA-C  ferrous sulfate 325 (65 FE) MG tablet Take 1 tablet (325 mg total) by mouth daily with breakfast. Patient not taking: Reported on 05/08/2018  11/13/15   Vivianne Master, PA-C  folic acid (FOLVITE) 1 MG tablet Take 1 tablet (1 mg total) by mouth daily. Patient not taking: Reported on 05/08/2018 11/13/15   Vivianne Master, PA-C  magnesium oxide (MAG-OX) 400 (241.3 Mg) MG tablet Take 1 tablet (400 mg total) by mouth 2 (two) times daily. Patient not taking: Reported on 05/08/2018 11/13/15   Vivianne Master, PA-C  mirtazapine (REMERON) 7.5 MG tablet Take 1 tablet (7.5 mg total) by mouth at bedtime. Patient not taking: Reported on 05/08/2018 11/13/15   Vivianne Master, PA-C  omeprazole (PRILOSEC) 20 MG capsule Take 1 capsule (20 mg total) by mouth daily. Patient not taking: Reported on 05/08/2018 11/13/15   Vivianne Master, PA-C  propranolol ER (INDERAL LA) 60 MG 24 hr capsule Take 1 capsule (60 mg total) by mouth daily. Patient not taking: Reported on 05/08/2018 11/13/15   Vivianne Master, PA-C  thiamine (VITAMIN B-1) 100 MG tablet Take 1 tablet (100 mg total) by mouth daily. Patient not taking: Reported on 05/08/2018 11/13/15   Vivianne Master, [place]    Physical Exam: Vitals:   05/08/18 0345 05/08/18 0445 05/08/18 0511 05/08/18 0545  BP: (!) 163/95 (!) 139/91 (!) 163/92 (!) 160/98  Pulse: 94 99 86 (!) 105  Resp: 13 19    SpO2: 95% 94% 96% 96%   General: Not in acute distress HEENT:       Eyes: PERRL, EOMI, no scleral icterus.       ENT: No discharge from the ears and nose, no pharynx injection, no tonsillar enlargement.        Neck: No JVD, no bruit, no mass felt. Heme: No neck lymph node enlargement. Cardiac: S1/S2, RRR, No murmurs, No gallops or rubs. Respiratory: Has decreased air movement bilaterally.  Has wheezing bilaterally. GI: Soft, nondistended, nontender, no rebound pain, no organomegaly, BS present. GU: No hematuria Ext: No pitting leg edema bilaterally. 2+DP/PT pulse bilaterally. Musculoskeletal: Has tenderness in the left scapular area. Skin: No rashes.  Neuro: Alert, oriented X3, cranial nerves II-XII grossly intact, moves  all extremities normally.  Psych: Patient is not psychotic, no suicidal or hemocidal ideation.  Labs on Admission: I have personally reviewed following labs and imaging studies  CBC: Recent Labs  Lab 05/07/18 2317  WBC 7.1  HGB 16.0  HCT 46.3  MCV 97.7  PLT 124*   Basic Metabolic Panel: Recent Labs  Lab 05/07/18 2317  NA 143  K 3.4*  CL 103  CO2 21*  GLUCOSE 89  BUN 5*  CREATININE 0.68  CALCIUM 9.9   GFR: CrCl cannot be calculated (Unknown ideal weight.). Liver Function Tests: No results for input(s): AST, ALT, ALKPHOS, BILITOT, PROT, ALBUMIN in the last 168 hours. No results for input(s): LIPASE, AMYLASE in the last 168 hours. No results for input(s): AMMONIA in the last 168 hours. Coagulation Profile: No results for input(s): INR, PROTIME in the last 168 hours. Cardiac Enzymes: No results for input(s): CKTOTAL, CKMB, CKMBINDEX, TROPONINI in the last 168 hours. BNP (last 3 results) No results  for input(s): PROBNP in the last 8760 hours. HbA1C: No results for input(s): HGBA1C in the last 72 hours. CBG: No results for input(s): GLUCAP in the last 168 hours. Lipid Profile: No results for input(s): CHOL, HDL, LDLCALC, TRIG, CHOLHDL, LDLDIRECT in the last 72 hours. Thyroid Function Tests: No results for input(s): TSH, T4TOTAL, FREET4, T3FREE, THYROIDAB in the last 72 hours. Anemia Panel: No results for input(s): VITAMINB12, FOLATE, FERRITIN, TIBC, IRON, RETICCTPCT in the last 72 hours. Urine analysis:    Component Value Date/Time   COLORURINE YELLOW 11/07/2015 0110   APPEARANCEUR CLEAR 11/07/2015 0110   LABSPEC 1.009 11/07/2015 0110   PHURINE 7.0 11/07/2015 0110   GLUCOSEU NEGATIVE 11/07/2015 0110   GLUCOSEU 100 02/05/2012 0947   HGBUR NEGATIVE 11/07/2015 0110   BILIRUBINUR NEGATIVE 11/07/2015 0110   KETONESUR NEGATIVE 11/07/2015 0110   PROTEINUR NEGATIVE 11/07/2015 0110   UROBILINOGEN >=8.0 02/05/2012 0947   NITRITE NEGATIVE 11/07/2015 0110   LEUKOCYTESUR  NEGATIVE 11/07/2015 0110   Sepsis Labs: @LABRCNTIP (procalcitonin:4,lacticidven:4) )No results found for this or any previous visit (from the past 240 hour(s)).   Radiological Exams on Admission: Dg Chest 2 View  Result Date: 05/07/2018 CLINICAL DATA:  Per EMS, pt from home c/o productive cough X2 weeks, L chest and shoulder pain (w/ deep inhalation). No fever, travel or potential exposure. Was given 5 albuterol, 10 morphine, 324 ASA. Pain only decreased from 10 to 6. Hypertension at 176/100, heart rate 106. Initial wheezing in lower lobes, now resolved w/ breathing treatment EXAM: CHEST - 2 VIEW COMPARISON:  02/05/2012 FINDINGS: Cardiac silhouette is normal in size and configuration. Normal mediastinal and hilar contours. Lungs show prominent bronchovascular markings increased compared to the prior radiographs. Lungs otherwise clear. No pleural effusion or pneumothorax. Skeletal structures are intact. IMPRESSION: No active cardiopulmonary disease. Electronically Signed   By: Amie [place] M.D.   On: 05/07/2018 23:46   Ct Angio Chest Pe W And/or Wo Contrast  Result Date: 05/08/2018 CLINICAL DATA:  Short of breath, cough and chest pain. EXAM: CT ANGIOGRAPHY CHEST WITH CONTRAST TECHNIQUE: Multidetector CT imaging of the chest was performed using the standard protocol during bolus administration of intravenous contrast. Multiplanar CT image reconstructions and MIPs were obtained to evaluate the vascular anatomy. CONTRAST:  68 mL OMNIPAQUE IOHEXOL 350 MG/ML SOLN COMPARISON:  Current chest radiographs. FINDINGS: Cardiovascular: There is satisfactory opacification of the pulmonary arteries to the segmental level. No evidence of a pulmonary embolism. Heart is normal in size. Minor 2 vessel coronary artery calcifications. No pericardial effusion. Great vessels are normal in caliber. No aortic dissection or atherosclerosis. Mediastinum/Nodes: No enlarged mediastinal, hilar, or axillary lymph nodes. Thyroid gland,  trachea, and esophagus demonstrate no significant findings. Lungs/Pleura: Bronchial wall thickening noted in the lower lobes with several segmental bronchi demonstrating mucous plugging. Lungs are clear. No pleural effusion or pneumothorax. Upper Abdomen: No acute abnormality. Musculoskeletal: No fracture or acute finding. No osteoblastic or osteolytic lesions. Minor disc degenerative changes along the thoracic spine. Review of the MIP images confirms the above findings. IMPRESSION: 1. No evidence of a pulmonary embolism. 2. Lower lobe bronchial wall thickening with a few lower lobe segmental bronchi showing mucous plugging. This suggests active bronchial inflammation. 3. No evidence of pneumonia or pulmonary edema. Electronically Signed   By: Amie [place] M.D.   On: 05/08/2018 03:41     EKG: Independently reviewed.  Sinus rhythm, QTC 481, LVH, LAE, early R wave progression and nonspecific T wave change.  Assessment/Plan Principal Problem:  COPD exacerbation (HCC) Active Problems:   Alcoholic cirrhosis of liver (HCC)   Hypokalemia   Essential hypertension, benign   GERD (gastroesophageal reflux disease)   Alcohol abuse   Tobacco abuse   Polysubstance abuse (HCC)   COPD exacerbation (HCC): Patient has productive cough, shortness of breath, wheezing and decreased air movement on lung auscultation, indicating COPD exacerbation. CTA negative for PE.  Patient received 1 g of magnesium sulfate in ED.  -will place on tele bed for obs -Nebulizers: scheduled Duoneb and prn albuterol -Solu-Medrol 60 mg IV tid -Z pak -Mucinex for cough  -Incentive spirometry -Follow up sputum culture, Flu pcr -Nasal cannula oxygen as needed to maintain O2 saturation 93% or greater  Alcoholic cirrhosis of liver (HCC): Mental status normal.  No abdominal distention or signs of ascites. -Check liver function  Hypokalemia: K= 3.4 on admission. - Repleted  Essential hypertension, benign: Patient supposed to  take her propranolol, and has he is not taking this medication at home. -Resume propranolol - IV hydralazine as needed  GERD (gastroesophageal reflux disease): -Protonix  Polysubstance abuse, Alcohol abuse and Tobacco abuse: -Did counseling about importance of quitting substance use-Nicotine patch -CIWA protocol -check UDS  Left scapula blade pain: No injury. -PRN Tylenol and as needed ketorolac for severe pain  DVT ppx: SQ Lovenox Code Status: Full code Family Communication: None at bed side.     Disposition Plan:  Anticipate discharge back to previous home environment Consults called:  none Admission status: Obs / tele    Date of Service 05/08/2018    Lorretta Harp [facility]   If 7PM-7AM, please contact night-coverage [email or web address] Password [facility] 05/08/2018, 6:46 AM

## 2018-05-08 NOTE — ED Notes (Signed)
Breakfast ordered 

## 2018-05-08 NOTE — Progress Notes (Signed)
Due to family care needs patient left AMA.

## 2018-05-08 NOTE — ED Notes (Signed)
Dr. Blake Divine called to notify patient continues to c/o chest pain radiating to left shoulder 7/10. Reshot EKG, giving PRN tylenol and torodol for pain. Giving IV hydralazine for elevated bp. NNO at this time. Will continue to monitor.

## 2018-05-13 NOTE — Discharge Summary (Signed)
Physician Discharge Summary  Jacob Hill XBJ:[id number] DOB: May 04, 1970 DOA: 05/07/2018  PCP: Etta Grandchild, MD  Admit date: 05/07/2018 Discharge date: 05/08/2018   PT LEFT AMA.   Recommendations for Outpatient Follow-up:  1. Follow up with PCP in 1-2 weeks 2. Please obtain BMP/CBC in one week   Discharge Diagnoses:  Principal Problem:   COPD exacerbation (HCC) Active Problems:   Alcoholic cirrhosis of liver (HCC)   Hypokalemia   Essential hypertension, benign   GERD (gastroesophageal reflux disease)   Alcohol abuse   Tobacco abuse   Polysubstance abuse (HCC)   COPD with acute exacerbation (HCC)  Jacob Night [place]is a 47 y.o.malewith medical history significant ofhypertension, COPD, GERD, polysubstance abuse (alcohol, tobacco, marijuana, cocaine, opiates), alcoholic liver cirrhosis, thrombocytopenia, who presents with cough and shortness of breath. CT angiogram is negative for PE, no signs of pneumonia, but showed bronchial mucous plug. Patient is placed on telemetry bed for observation.  1. COPD exacerbation.  Continue with IV steroids and duonebs, on antibiotics empirically.   But he left AMA DUE TO FAMILY ISSUES.   Discharge Instructions   Allergies as of 05/08/2018      Reactions   Codeine Nausea And Vomiting   Nsaids Nausea And Vomiting      Medication List    ASK your doctor about these medications   ferrous sulfate 325 (65 FE) MG tablet Take 1 tablet (325 mg total) by mouth daily with breakfast.   folic acid 1 MG tablet Commonly known as:  FOLVITE Take 1 tablet (1 mg total) by mouth daily.   gabapentin 300 MG capsule Commonly known as:  NEURONTIN Take 1 capsule (300 mg total) by mouth 3 (three) times daily.   magnesium oxide 400 (241.3 Mg) MG tablet Commonly known as:  MAG-OX Take 1 tablet (400 mg total) by mouth 2 (two) times daily.   mirtazapine 7.5 MG tablet Commonly known as:  REMERON Take 1 tablet (7.5 mg total) by mouth at  bedtime.   omeprazole 20 MG capsule Commonly known as:  PriLOSEC Take 1 capsule (20 mg total) by mouth daily.   propranolol ER 60 MG 24 hr capsule Commonly known as:  INDERAL LA Take 1 capsule (60 mg total) by mouth daily.   thiamine 100 MG tablet Commonly known as:  VITAMIN B-1 Take 1 tablet (100 mg total) by mouth daily.      Follow-up Information    [facility] Follow up on 05/27/2018.   Specialty:  Family Medicine Why:  9:30 for hospital follow up Contact information: [address] [place] [facility] [id number] [phone]         Allergies  Allergen Reactions  . Codeine Nausea And Vomiting  . Nsaids Nausea And Vomiting      Procedures/Studies: Dg Chest 2 View  Result Date: 05/07/2018 CLINICAL DATA:  Per EMS, pt from home c/o productive cough X2 weeks, L chest and shoulder pain (w/ deep inhalation). No fever, travel or potential exposure. Was given 5 albuterol, 10 morphine, 324 ASA. Pain only decreased from 10 to 6. Hypertension at 176/100, heart rate 106. Initial wheezing in lower lobes, now resolved w/ breathing treatment EXAM: CHEST - 2 VIEW COMPARISON:  02/05/2012 FINDINGS: Cardiac silhouette is normal in size and configuration. Normal mediastinal and hilar contours. Lungs show prominent bronchovascular markings increased compared to the prior radiographs. Lungs otherwise clear. No pleural effusion or pneumothorax. Skeletal structures are intact. IMPRESSION: No active cardiopulmonary disease. Electronically Signed  By: Amie Portland M.D.   On: 05/07/2018 23:46   Ct Angio Chest Pe W And/or Wo Contrast  Result Date: 05/08/2018 CLINICAL DATA:  Short of breath, cough and chest pain. EXAM: CT ANGIOGRAPHY CHEST WITH CONTRAST TECHNIQUE: Multidetector CT imaging of the chest was performed using the standard protocol during bolus administration of intravenous contrast. Multiplanar CT image reconstructions and MIPs were obtained to  evaluate the vascular anatomy. CONTRAST:  68 mL OMNIPAQUE IOHEXOL 350 MG/ML SOLN COMPARISON:  Current chest radiographs. FINDINGS: Cardiovascular: There is satisfactory opacification of the pulmonary arteries to the segmental level. No evidence of a pulmonary embolism. Heart is normal in size. Minor 2 vessel coronary artery calcifications. No pericardial effusion. Great vessels are normal in caliber. No aortic dissection or atherosclerosis. Mediastinum/Nodes: No enlarged mediastinal, hilar, or axillary lymph nodes. Thyroid gland, trachea, and esophagus demonstrate no significant findings. Lungs/Pleura: Bronchial wall thickening noted in the lower lobes with several segmental bronchi demonstrating mucous plugging. Lungs are clear. No pleural effusion or pneumothorax. Upper Abdomen: No acute abnormality. Musculoskeletal: No fracture or acute finding. No osteoblastic or osteolytic lesions. Minor disc degenerative changes along the thoracic spine. Review of the MIP images confirms the above findings. IMPRESSION: 1. No evidence of a pulmonary embolism. 2. Lower lobe bronchial wall thickening with a few lower lobe segmental bronchi showing mucous plugging. This suggests active bronchial inflammation. 3. No evidence of pneumonia or pulmonary edema. Electronically Signed   By: Amie Portland M.D.   On: 05/08/2018 03:41       Subjective: Pt left AMA due to family needs.   Discharge Exam: Vitals:   05/08/18 1356 05/08/18 1640  BP: (!) 146/112 (!) 153/89  Pulse: (!) 124 (!) 128  Resp:  16  Temp:  98.2 F (36.8 C)  SpO2:     Vitals:   05/08/18 0945 05/08/18 1000 05/08/18 1356 05/08/18 1640  BP: 139/86 (!) 151/130 (!) 146/112 (!) 153/89  Pulse: (!) 132 (!) 129 (!) 124 (!) 128  Resp: (!) 23 20  16   Temp:    98.2 F (36.8 C)  TempSrc:    Oral  SpO2: 96% 96%          The results of significant diagnostics from this hospitalization (including imaging, microbiology, ancillary and laboratory) are  listed below for reference.     Microbiology: Recent Results (from the past 240 hour(s))  Respiratory Panel by PCR     Status: None   Collection Time: 05/08/18 11:37 AM  Result Value Ref Range Status   Adenovirus NOT DETECTED NOT DETECTED Final   Coronavirus 229E NOT DETECTED NOT DETECTED Final    Comment: (NOTE) The Coronavirus on the Respiratory Panel, DOES NOT test for the novel  Coronavirus (2019 nCoV)    Coronavirus HKU1 NOT DETECTED NOT DETECTED Final   Coronavirus NL63 NOT DETECTED NOT DETECTED Final   Coronavirus OC43 NOT DETECTED NOT DETECTED Final   Metapneumovirus NOT DETECTED NOT DETECTED Final   Rhinovirus / Enterovirus NOT DETECTED NOT DETECTED Final   Influenza A NOT DETECTED NOT DETECTED Final   Influenza B NOT DETECTED NOT DETECTED Final   Parainfluenza Virus 1 NOT DETECTED NOT DETECTED Final   Parainfluenza Virus 2 NOT DETECTED NOT DETECTED Final   Parainfluenza Virus 3 NOT DETECTED NOT DETECTED Final   Parainfluenza Virus 4 NOT DETECTED NOT DETECTED Final   Respiratory Syncytial Virus NOT DETECTED NOT DETECTED Final   Bordetella pertussis NOT DETECTED NOT DETECTED Final   Chlamydophila pneumoniae NOT DETECTED  NOT DETECTED Final   Mycoplasma pneumoniae NOT DETECTED NOT DETECTED Final    Comment: Performed at [facility], [address], [place], [place] [address]     Labs: BNP (last 3 results) No results for input(s): BNP in the last 8760 hours. Basic Metabolic Panel: Recent Labs  Lab 05/07/18 2317 05/08/18 0854  NA 143  --   K 3.4* 3.8  CL 103  --   CO2 21*  --   GLUCOSE 89  --   BUN 5*  --   CREATININE 0.68  --   CALCIUM 9.9  --   MG  --  2.0   Liver Function Tests: Recent Labs  Lab 05/08/18 0652  AST 64*  ALT 43  ALKPHOS 42  BILITOT 1.3*  PROT 7.8  ALBUMIN 3.7   No results for input(s): LIPASE, AMYLASE in the last 168 hours. No results for input(s): AMMONIA in the last 168 hours. CBC: Recent Labs  Lab 05/07/18 2317  WBC  7.1  HGB 16.0  HCT 46.3  MCV 97.7  PLT 124*   Cardiac Enzymes: Recent Labs  Lab 05/08/18 0854 05/08/18 1418  CKTOTAL  --  129  TROPONINI <0.03 <0.03   BNP: Invalid input(s): POCBNP CBG: No results for input(s): GLUCAP in the last 168 hours. D-Dimer No results for input(s): DDIMER in the last 72 hours. Hgb A1c No results for input(s): HGBA1C in the last 72 hours. Lipid Profile No results for input(s): CHOL, HDL, LDLCALC, TRIG, CHOLHDL, LDLDIRECT in the last 72 hours. Thyroid function studies No results for input(s): TSH, T4TOTAL, T3FREE, THYROIDAB in the last 72 hours.  Invalid input(s): FREET3 Anemia work up No results for input(s): VITAMINB12, FOLATE, FERRITIN, TIBC, IRON, RETICCTPCT in the last 72 hours. Urinalysis    Component Value Date/Time   COLORURINE YELLOW 11/07/2015 0110   APPEARANCEUR CLEAR 11/07/2015 0110   LABSPEC 1.009 11/07/2015 0110   PHURINE 7.0 11/07/2015 0110   GLUCOSEU NEGATIVE 11/07/2015 0110   GLUCOSEU 100 02/05/2012 0947   HGBUR NEGATIVE 11/07/2015 0110   BILIRUBINUR NEGATIVE 11/07/2015 0110   KETONESUR NEGATIVE 11/07/2015 0110   PROTEINUR NEGATIVE 11/07/2015 0110   UROBILINOGEN >=8.0 02/05/2012 0947   NITRITE NEGATIVE 11/07/2015 0110   LEUKOCYTESUR NEGATIVE 11/07/2015 0110   Sepsis Labs Invalid input(s): PROCALCITONIN,  WBC,  LACTICIDVEN Microbiology Recent Results (from the past 240 hour(s))  Respiratory Panel by PCR     Status: None   Collection Time: 05/08/18 11:37 AM  Result Value Ref Range Status   Adenovirus NOT DETECTED NOT DETECTED Final   Coronavirus 229E NOT DETECTED NOT DETECTED Final    Comment: (NOTE) The Coronavirus on the Respiratory Panel, DOES NOT test for the novel  Coronavirus (2019 nCoV)    Coronavirus HKU1 NOT DETECTED NOT DETECTED Final   Coronavirus NL63 NOT DETECTED NOT DETECTED Final   Coronavirus OC43 NOT DETECTED NOT DETECTED Final   Metapneumovirus NOT DETECTED NOT DETECTED Final   Rhinovirus /  Enterovirus NOT DETECTED NOT DETECTED Final   Influenza A NOT DETECTED NOT DETECTED Final   Influenza B NOT DETECTED NOT DETECTED Final   Parainfluenza Virus 1 NOT DETECTED NOT DETECTED Final   Parainfluenza Virus 2 NOT DETECTED NOT DETECTED Final   Parainfluenza Virus 3 NOT DETECTED NOT DETECTED Final   Parainfluenza Virus 4 NOT DETECTED NOT DETECTED Final   Respiratory Syncytial Virus NOT DETECTED NOT DETECTED Final   Bordetella pertussis NOT DETECTED NOT DETECTED Final   Chlamydophila pneumoniae NOT DETECTED NOT  DETECTED Final   Mycoplasma pneumoniae NOT DETECTED NOT DETECTED Final    Comment: Performed at [facility], [address], [place], [place] [address]       SIGNED:   Kathlen Mody, MD  [facility] 05/13/2018, 12:16 PM Pager   If 7PM-7AM, please contact night-coverage [email or web address] Password [email or web address]

## 2018-05-27 ENCOUNTER — Ambulatory Visit: Payer: Self-pay | Attending: Primary Care | Admitting: Primary Care

## 2018-05-27 ENCOUNTER — Other Ambulatory Visit: Payer: Self-pay

## 2018-05-27 ENCOUNTER — Inpatient Hospital Stay (INDEPENDENT_AMBULATORY_CARE_PROVIDER_SITE_OTHER): Payer: Self-pay | Admitting: Primary Care

## 2018-06-11 ENCOUNTER — Ambulatory Visit: Payer: Self-pay | Attending: Family Medicine | Admitting: Family Medicine

## 2018-06-11 ENCOUNTER — Encounter: Payer: Self-pay | Admitting: Family Medicine

## 2018-06-11 ENCOUNTER — Other Ambulatory Visit: Payer: Self-pay

## 2018-06-11 DIAGNOSIS — G609 Hereditary and idiopathic neuropathy, unspecified: Secondary | ICD-10-CM

## 2018-06-11 DIAGNOSIS — K7030 Alcoholic cirrhosis of liver without ascites: Secondary | ICD-10-CM

## 2018-06-11 DIAGNOSIS — D510 Vitamin B12 deficiency anemia due to intrinsic factor deficiency: Secondary | ICD-10-CM

## 2018-06-11 DIAGNOSIS — Z09 Encounter for follow-up examination after completed treatment for conditions other than malignant neoplasm: Secondary | ICD-10-CM

## 2018-06-11 DIAGNOSIS — F411 Generalized anxiety disorder: Secondary | ICD-10-CM

## 2018-06-11 DIAGNOSIS — J441 Chronic obstructive pulmonary disease with (acute) exacerbation: Secondary | ICD-10-CM

## 2018-06-11 DIAGNOSIS — K219 Gastro-esophageal reflux disease without esophagitis: Secondary | ICD-10-CM

## 2018-06-11 DIAGNOSIS — F17200 Nicotine dependence, unspecified, uncomplicated: Secondary | ICD-10-CM

## 2018-06-11 MED ORDER — PREDNISONE 20 MG PO TABS: ORAL_TABLET | ORAL | 0 refills | Status: DC

## 2018-06-11 MED ORDER — MIRTAZAPINE 7.5 MG PO TABS: 7.5000 mg | ORAL_TABLET | Freq: Every day | ORAL | 3 refills | Status: DC

## 2018-06-11 MED ORDER — VITAMIN B-1 100 MG PO TABS: 100.0000 mg | ORAL_TABLET | Freq: Every day | ORAL | 3 refills | Status: DC

## 2018-06-11 MED ORDER — BUDESONIDE-FORMOTEROL FUMARATE 160-4.5 MCG/ACT IN AERO: 2.0000 | INHALATION_SPRAY | Freq: Two times a day (BID) | RESPIRATORY_TRACT | 11 refills | Status: DC

## 2018-06-11 MED ORDER — GABAPENTIN 300 MG PO CAPS: ORAL_CAPSULE | ORAL | 3 refills | Status: DC

## 2018-06-11 MED ORDER — ALBUTEROL SULFATE HFA 108 (90 BASE) MCG/ACT IN AERS: 2.0000 | INHALATION_SPRAY | Freq: Four times a day (QID) | RESPIRATORY_TRACT | 3 refills | Status: DC | PRN

## 2018-06-11 MED ORDER — PROPRANOLOL HCL ER 60 MG PO CP24: 60.0000 mg | ORAL_CAPSULE | Freq: Every day | ORAL | 4 refills | Status: DC

## 2018-06-11 MED ORDER — DOXYCYCLINE HYCLATE 100 MG PO TABS: 100.0000 mg | ORAL_TABLET | Freq: Two times a day (BID) | ORAL | 0 refills | Status: DC

## 2018-06-11 MED ORDER — OMEPRAZOLE 20 MG PO CPDR: 20.0000 mg | DELAYED_RELEASE_CAPSULE | Freq: Every day | ORAL | 3 refills | Status: DC

## 2018-06-11 MED ORDER — FOLIC ACID 1 MG PO TABS: 1.0000 mg | ORAL_TABLET | Freq: Every day | ORAL | 3 refills | Status: DC

## 2018-06-11 MED FILL — GABAPENTIN 300 MG CAPSULE: 300 | 30 days supply | Qty: 120 | Fill #0

## 2018-06-11 MED FILL — ?DOXYCYCLINE HYCLATE 100MG: 100 | 10 days supply | Qty: 20 | Fill #0

## 2018-06-11 MED FILL — !VENTOLIN HFA INHALER: 108 (90 BAS | 25 days supply | Qty: 18 | Fill #0

## 2018-06-11 MED FILL — ?FOLIC ACID 1MG TAB: 1 | 30 days supply | Qty: 30 | Fill #0

## 2018-06-11 MED FILL — !SYMBICORT 160-4.5 MCG INH: 160-4.5 | 30 days supply | Qty: 1 | Fill #0

## 2018-06-11 MED FILL — PROPRANOLOL ER 60 MG CAP: 60 | 30 days supply | Qty: 30 | Fill #0

## 2018-06-11 MED FILL — ?OMEPRAZOLE 20 MG CAPSULE D: 20 | 30 days supply | Qty: 30 | Fill #0

## 2018-06-11 NOTE — Progress Notes (Signed)
Virtual Visit via Telephone Note  I connected with Jacob Hill on 06/11/18 at  1:30 PM EDT by telephone and verified that I am speaking with the correct person using two identifiers.   I discussed the limitations, risks, security and privacy concerns of performing an evaluation and management service by telephone and the availability of in person appointments. I also discussed with the patient that there may be a patient responsible charge related to this service. The patient expressed understanding and agreed to proceed.  Patient Location: Home Provider Location: Office Others participating in call: Rudi Heap, CMA   History of Present Illness:      48 yo male last seen in this office on 11/13/2015 who is status post hospitalization on 05/07/2018 for onset of shortness of breath and cough and he was diagnosed with COPD. Patient thinks that he has smoked 1 ppd for about 30 years but decreased to 2 cigarettes per day after his hospitalization. Patient was feeling better after his hospitalization but in the past 2-3 days he has had an increased cough with production of white sputum and increased chest tightness and increased shortness of breath with activity. Patient states that some medications were mentioned that he would be prescribed however he is not sure that all of this medications were prescribed. Patient states that he had to leave the hospital to help out at home with his wife and children but that the doctors wanted him to stay in the hospital longer. He needs a refill of Symbicort and an inhaler to use as needed for shortness of breath. He does continue to smoke.         He also has hypertension. He denies any current headaches or dizziness related to BP. He also has chronic numbness in his feet but has told the cause of the numbness. He is taking vitamin supplements (per chart he has a history of pernicious anemia). He does have issues with acid reflux, upper mid-abdominal pain  unless he takes his omeprazole daily.  He has had long-standing issues with feeling anxious. The medication that he has for anxiety has helped but due to the current COVID-19 pandemic he is concerned about his finances and has had an increase in anxiety. He does drink alcohol to help with his anxiety. He has been told in the past that he has cirrhosis due to his alcohol use.   Past Medical History:  Diagnosis Date  . Alcohol abuse   . Cirrhosis, alcoholic (HCC) 2013   seen with mild splenomegaly on CT 2013.  Hep ABC negative in 2013.   . Coagulopathy (HCC) 09/2011   elevated PT/INR  . COPD (chronic obstructive pulmonary disease) (HCC)   . DJD (degenerative joint disease)   . GERD (gastroesophageal reflux disease)   . Hypertension   . Marijuana abuse   . Neuropathy   . Opiate dependence (HCC) 2010   treated with Methadone in past.   . Osteoarthritis   . Seizures (HCC)   . Thrombocytopenia (HCC) 09/2013  . Tobacco abuse     Past Surgical History:  Procedure Laterality Date  . ESOPHAGOGASTRODUODENOSCOPY (EGD) WITH PROPOFOL N/A 11/08/2015   Procedure: ESOPHAGOGASTRODUODENOSCOPY (EGD) WITH PROPOFOL;  Surgeon: Rachael Fee, MD;  Location: [facility];  Service: Endoscopy;  Laterality: N/A;  . HEMORRHOID SURGERY      Family History  Problem Relation Age of Onset  . Diabetes Other   . Hypertension Other   . Diabetes Father     Social History  Tobacco Use  . Smoking status: Current Every Day Smoker    Packs/day: 0.50    Years: 15.00    Pack years: 7.50    Types: Cigarettes  . Smokeless tobacco: Never Used  Substance Use Topics  . Alcohol use: Yes    Alcohol/week: 9.0 standard drinks    Types: 9 Cans of beer per week    Comment: No beer in 7 days  . Drug use: Yes    Types: Marijuana     Allergies  Allergen Reactions  . Codeine Nausea And Vomiting  . Nsaids Nausea And Vomiting    Review of Systems  Constitutional: Positive for malaise/fatigue. Negative for chills  and fever.  HENT: Negative for congestion and sore throat.   Respiratory: Positive for cough, sputum production (currently white) and shortness of breath. Negative for wheezing.   Cardiovascular: Negative for chest pain and palpitations.  Gastrointestinal: Positive for heartburn. Negative for abdominal pain, constipation, diarrhea, nausea and vomiting.  Genitourinary: Negative for dysuria and frequency.  Musculoskeletal: Positive for back pain (occasional ). Negative for joint pain and myalgias.     Observations/Objective: No vital signs or physical exam conducted as visit was done via telephone  Assessment and Plan: 1. COPD with acute exacerbation [facility](HCC) Patient status post hospitalization due to COPD exacerbation and he reports that he has had onset over the past few days of similar symptoms. Patient left AMA after only staying overnight in the hospital. Rx's will be sent into for refill of patient's Symbicort and an albuterol inhaler.Will also place patient on prednisone and doxycycline for current COPD exacerbation and complete smoking cessation encouraged. - budesonide-formoterol (SYMBICORT) 160-4.5 MCG/ACT inhaler; Inhale 2 puffs into the lungs 2 (two) times daily.  Dispense: 1 Inhaler; Refill: 11 - albuterol (VENTOLIN HFA) 108 (90 Base) MCG/ACT inhaler; Inhale 2 puffs into the lungs every 6 (six) hours as needed for wheezing or shortness of breath.  Dispense: 1 Inhaler; Refill: 3 - doxycycline (VIBRA-TABS) 100 MG tablet; Take 1 tablet (100 mg total) by mouth 2 (two) times daily.  Dispense: 20 tablet; Refill: 0 - predniSONE (DELTASONE) 20 MG tablet; Take 2 pills by mouth once per day for 5 days; eat prior to taking medication  Dispense: 10 tablet; Refill: 0  2. Alcoholic cirrhosis of liver without ascites (HCC) Patient will be referred to social work for resources to help with alcohol cessation as well as resources to help with medications. Patient given refills of thiamin and folic acid  and will check CMP. On review of chart, patient has not had imaging of his liver since 2017 at which time MRI of the abdomen did show hepatic fibrosis with portal hypertension and mild hepatic steatosis. Will discuss GI referral at upcoming inpatient visit. - Ambulatory referral to Social Work - thiamine (VITAMIN B-1) 100 MG tablet; Take 1 tablet (100 mg total) by mouth daily.  Dispense: 30 tablet; Refill: 3 - folic acid (FOLVITE) 1 MG tablet; Take 1 tablet (1 mg total) by mouth daily.  Dispense: 30 tablet; Refill: 3 - Comprehensive metabolic panel; Future  3. Gastroesophageal reflux disease without esophagitis Continue use of omeprazole and avoidance of known trigger foods and decrease alcohol use with goal of cessation  4. Pernicious anemia Will check CBC and Vitamin B12 level. Patient also with low platelet count of 102 on CBC done 06/12/18 but Hgb was normal at 17.4 with normal MCV - Vitamin B12; Future - CBC with Differential; Future  5. Idiopathic peripheral neuropathy Refill of gabapentin  and check Vitamin B12 level - gabapentin (NEURONTIN) 300 MG capsule; Take one capsule in the morning, one in afternoon/early evening and two at bedtime  Dispense: 120 capsule; Refill: 3 - Vitamin B12; Future  6. Generalized anxiety disorder Refill of propranolol (which may have also been prescribed for hypertension or portal hypertension) and refill of remeron - propranolol ER (INDERAL LA) 60 MG 24 hr capsule; Take 1 capsule (60 mg total) by mouth daily. For blood pressure and to lower heart rate  Dispense: 30 capsule; Refill: 4 - mirtazapine (REMERON) 7.5 MG tablet; Take 1 tablet (7.5 mg total) by mouth at bedtime.  Dispense: 30 tablet; Refill: 3  7. Tobacco dependence Patient reports that he has decreased his tobacco intake s/p hospital discharge and complete smoking cessation advised   8. Hospital Discharge Notes, labs and imaging from patient's hospitalization from 05/07/18-05/08/2018 reviewed  and discussed with the patient  Follow Up Instructions:Return in about 2 weeks (around 06/25/2018) for COPD- Dr. Waynetta Sandy if available.    I discussed the assessment and treatment plan with the patient. The patient was provided an opportunity to ask questions and all were answered. The patient agreed with the plan and demonstrated an understanding of the instructions.   The patient was advised to call back or seek an in-person evaluation if the symptoms worsen or if the condition fails to improve as anticipated.  I provided 22 minutes of non-face-to-face time during this encounter.   Cain Saupe, MD

## 2018-06-11 NOTE — Progress Notes (Signed)
Hospital Follow up: COPD and Bronchitis   Med refills  Per pt he would like to have patches to stop smoking   Per patient this is his Today blood pressure from home: 170/99 Temp is 98.0 HR is 82  Per patient he have really bad Anxiety medication

## 2018-06-11 NOTE — Progress Notes (Deleted)
Virtual Visit via Telephone Note  I connected with Jacob Hill on 06/11/18 at  1:30 PM EDT by telephone and verified that I am speaking with the correct person using two identifiers.   I discussed the limitations, risks, security and privacy concerns of performing an evaluation and management service by telephone and the availability of in person appointments. I also discussed with the patient that there may be a patient responsible charge related to this service. The patient expressed understanding and agreed to proceed.  Patient Location: Provider Location: Others participating in call:   History of Present Illness:    .pmh .surg    Observations/Objective:   Assessment and Plan:   Follow Up Instructions:    I discussed the assessment and treatment plan with the patient. The patient was provided an opportunity to ask questions and all were answered. The patient agreed with the plan and demonstrated an understanding of the instructions.   The patient was advised to call back or seek an in-person evaluation if the symptoms worsen or if the condition fails to improve as anticipated.  I provided *** minutes of non-face-to-face time during this encounter.   Cain Saupe, MD

## 2018-06-12 ENCOUNTER — Ambulatory Visit: Payer: Self-pay | Attending: Family Medicine

## 2018-06-12 DIAGNOSIS — D510 Vitamin B12 deficiency anemia due to intrinsic factor deficiency: Secondary | ICD-10-CM

## 2018-06-12 DIAGNOSIS — K7030 Alcoholic cirrhosis of liver without ascites: Secondary | ICD-10-CM

## 2018-06-12 DIAGNOSIS — G609 Hereditary and idiopathic neuropathy, unspecified: Secondary | ICD-10-CM

## 2018-06-12 MED FILL — ?PREDNISONE 10 MG TABLET: 10 | 5 days supply | Qty: 20 | Fill #0

## 2018-06-12 MED FILL — ?MIRTAZAPINE 15 MG TABLET: 15 | 30 days supply | Qty: 15 | Fill #0

## 2018-06-13 LAB — CBC WITH DIFFERENTIAL/PLATELET
Basophils Absolute: 0.1 x10E3/uL (ref 0.0–0.2)
Basos: 1 %
EOS (ABSOLUTE): 0.5 x10E3/uL — ABNORMAL HIGH (ref 0.0–0.4)
Eos: 9 %
Hematocrit: 48.6 % (ref 37.5–51.0)
Hemoglobin: 17.4 g/dL (ref 13.0–17.7)
Immature Grans (Abs): 0 x10E3/uL (ref 0.0–0.1)
Immature Granulocytes: 0 %
Lymphocytes Absolute: 1.7 x10E3/uL (ref 0.7–3.1)
Lymphs: 30 %
MCH: 32.5 pg (ref 26.6–33.0)
MCHC: 35.8 g/dL — ABNORMAL HIGH (ref 31.5–35.7)
MCV: 91 fL (ref 79–97)
Monocytes Absolute: 1 x10E3/uL — ABNORMAL HIGH (ref 0.1–0.9)
Monocytes: 16 %
Neutrophils Absolute: 2.6 x10E3/uL (ref 1.4–7.0)
Neutrophils: 44 %
Platelets: 102 x10E3/uL — ABNORMAL LOW (ref 150–450)
RBC: 5.36 x10E6/uL (ref 4.14–5.80)
RDW: 11.8 % (ref 11.6–15.4)
WBC: 5.9 x10E3/uL (ref 3.4–10.8)

## 2018-06-13 LAB — COMPREHENSIVE METABOLIC PANEL WITH GFR
ALT: 37 IU/L (ref 0–44)
AST: 65 IU/L — ABNORMAL HIGH (ref 0–40)
Albumin/Globulin Ratio: 1.2 (ref 1.2–2.2)
Albumin: 4.3 g/dL (ref 4.0–5.0)
Alkaline Phosphatase: 59 IU/L (ref 39–117)
BUN/Creatinine Ratio: 11 (ref 9–20)
BUN: 9 mg/dL (ref 6–24)
Bilirubin Total: 1.9 mg/dL — ABNORMAL HIGH (ref 0.0–1.2)
CO2: 21 mmol/L (ref 20–29)
Calcium: 9.8 mg/dL (ref 8.7–10.2)
Chloride: 103 mmol/L (ref 96–106)
Creatinine, Ser: 0.83 mg/dL (ref 0.76–1.27)
GFR calc Af Amer: 121 mL/min/1.73
GFR calc non Af Amer: 105 mL/min/1.73
Globulin, Total: 3.5 g/dL (ref 1.5–4.5)
Glucose: 87 mg/dL (ref 65–99)
Potassium: 4.5 mmol/L (ref 3.5–5.2)
Sodium: 139 mmol/L (ref 134–144)
Total Protein: 7.8 g/dL (ref 6.0–8.5)

## 2018-06-13 LAB — VITAMIN B12: Vitamin B-12: 1322 pg/mL — ABNORMAL HIGH (ref 232–1245)

## 2018-06-14 ENCOUNTER — Encounter: Payer: Self-pay | Admitting: Family Medicine

## 2018-06-14 DIAGNOSIS — F17200 Nicotine dependence, unspecified, uncomplicated: Secondary | ICD-10-CM | POA: Insufficient documentation

## 2018-06-28 NOTE — Progress Notes (Signed)
Patient ID: Jacob Hill Albrecht, male   DOB: 01/26/1971, 48 y.o.   MRN: [id number]  Virtual Visit via Telephone Note  I connected with Jacob Hill Sweis on 06/29/18 at 10:30 AM EDT by telephone and verified that I am speaking with the correct person using two identifiers.   Consent:  I discussed the limitations, risks, security and privacy concerns of performing an evaluation and management service by telephone and the availability of in person appointments. I also discussed with the patient that there may be a patient responsible charge related to this service. The patient expressed understanding and agreed to proceed.  Location of patient: The patient was at his fianc's home  Location of provider: I was in my office  Persons participating in the televisit with the patient.   The patient's fianc was also on the phone at the same time  History of Present Illness: This is a 48 year old male who had previously been in our office in 2017 without other subsequent follow-ups and then was admitted May 07, 2018 for COPD exacerbation.  The patient was seen by way of a telephone visit on June 11, 2018 for post hospital follow-up with Dr. Jillyn HiddenFulp. Dr. Jillyn HiddenFulp also wanted a pulmonary evaluation thus this patient was placed on my schedule in referral for COPD evaluation.  The patient has a several year history of COPD with ongoing tobacco use up to 2 packs a day.  The patient however was not on any regular inhaled medications.  Note the patient also was drinking heavily with beer and using cocaine.  The patient had noted increased cough with associated white mucus and chest tightness and wheezing along with shortness of breath.  The patient notes he was short of breath even at rest.  There was no fever.  He was not tested for COVID during the hospitalization.  Note he had negative influenza panel and a negative respiratory virus panel.  Also note the patient had  prior history of alcoholic cirrhosis.  Note  the patient has not had imaging studies since 2017. Also note more recently over the last week the patient fell while walking his dog and has had an injury to the right side of his chest.  Is worse if he takes a deep breath.  There is severe pain in the right chest.  He has had previous rib fractures before.  The patient is now down to drinking 140 ounce of beer daily.  The patient is now down to 2 to 4 cigarettes daily.   The patient states he was using cocaine and alcohol to combat significant anxiety.    Observations/Objective: No observations, this was telephone visit.  Assessment and Plan:  #1 COPD with recurrent exacerbation  Note the patient was improving on the Symbicort prednisone and antibiotic when he fell and injured his right chest and now has right chest wall pain.  Plan will be for the patient to continue using Symbicort 2 inhalations twice daily.  Note in the call it was determined the patient was not using the inhaler properly.  He will need an in office visit for further instruction.  In the meantime he will continue Symbicort 2 inhalations twice daily and albuterol as needed.  No additional prednisone or antibiotics are indicated.  Smoking cessation was continued to be encouraged.  #2 right-sided chest wall pain status post trauma: For this we will perform an imaging study of the ribs and chest  #3 alcohol use with history of alcoholic cirrhosis.  The patient  has repeat imaging study upcoming.  He was encouraged to stay on thiamine and folic acid and to pursue alcohol cessation and we will connect him with our licensed clinical social worker  #4 gastroesophageal reflux disease without esophagitis The patient will continue using omeprazole as prescribed  #5 tobacco dependence Patient reports that he has decreased his tobacco intake s/p hospital discharge and complete smoking cessation advised  #6 cocaine use and alcohol use with associated generalized anxiety  disorder  I plan to connect the patient with our licensed clinical social worker  Note the patient did state that the Remeron he received from the last visit actually did not help him sleep and he has stopped taking this medication  Note I did explain to the patient how he can go about obtaining refills on his medications  Follow Up Instructions: The patient understands he will have an in office visit within the next 2 weeks and also understands x-rays of the ribs will be obtained as a walk-in basis  The patient also understands that our licensed clinical social worker will be in touch with the patient   I discussed the assessment and treatment plan with the patient. The patient was provided an opportunity to ask questions and all were answered. The patient agreed with the plan and demonstrated an understanding of the instructions.   The patient was advised to call back or seek an in-person evaluation if the symptoms worsen or if the condition fails to improve as anticipated.  I provided 30  minutes of non-face-to-face time during this encounter  including  median intraservice time , review of notes, labs, imaging, medications  and explaining diagnosis and management to the patient .    Shan Levans, MD

## 2018-06-29 ENCOUNTER — Encounter: Payer: Self-pay | Admitting: Critical Care Medicine

## 2018-06-29 ENCOUNTER — Ambulatory Visit: Payer: Self-pay | Attending: Critical Care Medicine | Admitting: Critical Care Medicine

## 2018-06-29 ENCOUNTER — Other Ambulatory Visit: Payer: Self-pay

## 2018-06-29 ENCOUNTER — Telehealth: Payer: Self-pay | Admitting: *Deleted

## 2018-06-29 DIAGNOSIS — I864 Gastric varices: Secondary | ICD-10-CM

## 2018-06-29 DIAGNOSIS — F411 Generalized anxiety disorder: Secondary | ICD-10-CM

## 2018-06-29 DIAGNOSIS — K766 Portal hypertension: Secondary | ICD-10-CM

## 2018-06-29 DIAGNOSIS — J441 Chronic obstructive pulmonary disease with (acute) exacerbation: Secondary | ICD-10-CM

## 2018-06-29 DIAGNOSIS — F1910 Other psychoactive substance abuse, uncomplicated: Secondary | ICD-10-CM

## 2018-06-29 DIAGNOSIS — K7030 Alcoholic cirrhosis of liver without ascites: Secondary | ICD-10-CM

## 2018-06-29 DIAGNOSIS — S299XXA Unspecified injury of thorax, initial encounter: Secondary | ICD-10-CM | POA: Insufficient documentation

## 2018-06-29 DIAGNOSIS — K3189 Other diseases of stomach and duodenum: Secondary | ICD-10-CM

## 2018-06-29 DIAGNOSIS — F17210 Nicotine dependence, cigarettes, uncomplicated: Secondary | ICD-10-CM

## 2018-06-29 DIAGNOSIS — F1010 Alcohol abuse, uncomplicated: Secondary | ICD-10-CM

## 2018-06-29 DIAGNOSIS — F17200 Nicotine dependence, unspecified, uncomplicated: Secondary | ICD-10-CM

## 2018-06-29 DIAGNOSIS — F1410 Cocaine abuse, uncomplicated: Secondary | ICD-10-CM

## 2018-06-29 DIAGNOSIS — K219 Gastro-esophageal reflux disease without esophagitis: Secondary | ICD-10-CM

## 2018-06-29 NOTE — Telephone Encounter (Addendum)
Per Dr. Delford Field to call patient and schedule a Chest Xray for him. Per Dr. Delford Field this will be ordered for patient to go to [facility]. Called [facility] to schedule appt and spoke with Maralyn Sago. Per Maralyn Sago, Chest Xray are walk in Mon-Thursday from 8:00 to 4:30pm. Per Maralyn Sago, the locations are [address] [place] and [address] [place]. Spoke with patient and informed him with information and he verbalize understanding.

## 2018-06-29 NOTE — Progress Notes (Signed)
Today visit is about his COPD. Per pt he is still having trouble with it. Per pt he got tangled with the dogs and fell while he was walking them. Per pt his right side hurt. Per pt he did not take anything for it was a tylenol last night. Per pt he broke his ribs a bunch of times in the past. Per patient he is interested in stop smoking.

## 2018-07-01 ENCOUNTER — Ambulatory Visit: Payer: Self-pay | Admitting: Licensed Clinical Social Worker

## 2018-07-01 ENCOUNTER — Encounter: Payer: Self-pay | Admitting: Licensed Clinical Social Worker

## 2018-07-15 ENCOUNTER — Ambulatory Visit: Payer: Self-pay | Admitting: Family Medicine

## 2018-07-21 ENCOUNTER — Ambulatory Visit: Payer: Self-pay | Admitting: Family Medicine

## 2018-07-21 ENCOUNTER — Other Ambulatory Visit: Payer: Self-pay

## 2018-07-21 ENCOUNTER — Ambulatory Visit: Payer: Self-pay | Attending: Family Medicine | Admitting: Family Medicine

## 2018-07-21 DIAGNOSIS — J441 Chronic obstructive pulmonary disease with (acute) exacerbation: Secondary | ICD-10-CM

## 2018-07-21 DIAGNOSIS — F17200 Nicotine dependence, unspecified, uncomplicated: Secondary | ICD-10-CM

## 2018-07-21 DIAGNOSIS — F17210 Nicotine dependence, cigarettes, uncomplicated: Secondary | ICD-10-CM

## 2018-07-21 MED ORDER — NICOTINE 7 MG/24HR TD PT24: 7.0000 mg | MEDICATED_PATCH | Freq: Every day | TRANSDERMAL | 1 refills | Status: DC

## 2018-07-21 MED ORDER — UMECLIDINIUM BROMIDE 62.5 MCG/INH IN AEPB: 1.0000 | INHALATION_SPRAY | Freq: Every day | RESPIRATORY_TRACT | 3 refills | Status: DC

## 2018-07-21 MED ORDER — PREDNISONE 20 MG PO TABS: 20.0000 mg | ORAL_TABLET | Freq: Two times a day (BID) | ORAL | 0 refills | Status: DC

## 2018-07-21 NOTE — Progress Notes (Signed)
Patient has been called and DOB has been verified. Patient has been screened and transferred to PCP to start phone visit.     

## 2018-07-21 NOTE — Progress Notes (Signed)
Virtual Visit via Telephone Note  I connected with Jacob Hill, on 07/21/2018 at 10:43 AM by telephone due to the COVID-19 pandemic and verified that I am speaking with the correct person using two identifiers.   Consent: I discussed the limitations, risks, security and privacy concerns of performing an evaluation and management service by telephone and the availability of in person appointments. I also discussed with the patient that there may be a patient responsible charge related to this service. The patient expressed understanding and agreed to proceed.   Location of Patient: Patient's home  Location of Provider: Clinic   Persons participating in Telemedicine visit: Kelvin Lunde Farrington-CMA Dr. Nelwyn Salisbury    History of Present Illness: 48 year old male with history of COPD, tobacco abuse, liver cirrhosis, alcohol abuse seen today with complaints of dyspnea which has been fluctuating and occurs sometimes at rest and at other times with exertion.  He was seen by Dr. Delford Field 2 weeks ago at which time he had also sustained right-sided chest wall trauma.  He reports resolution of chest wall symptoms and endorses compliance with Symbicort and Proventil. He has had a cough with production of whitish to yellowish phlegm which he noticed has increased as he try to cut back on his cigarette smoking. He smokes 3 cigarettes/day. Denies fever, wheezing, myalgias.    Past Medical History:  Diagnosis Date  . Alcohol abuse   . Cirrhosis, alcoholic (HCC) 2013   seen with mild splenomegaly on CT 2013.  Hep ABC negative in 2013.   . Coagulopathy (HCC) 09/2011   elevated PT/INR  . COPD (chronic obstructive pulmonary disease) (HCC)   . DJD (degenerative joint disease)   . GERD (gastroesophageal reflux disease)   . Hypertension   . Marijuana abuse   . Neuropathy   . Opiate dependence (HCC) 2010   treated with Methadone in past.   . Osteoarthritis   . Seizures (HCC)    . Thrombocytopenia (HCC) 09/2013  . Tobacco abuse    Allergies  Allergen Reactions  . Codeine Nausea And Vomiting  . Nsaids Nausea And Vomiting    Current Outpatient Medications on File Prior to Visit  Medication Sig Dispense Refill  . albuterol (VENTOLIN HFA) 108 (90 Base) MCG/ACT inhaler Inhale 2 puffs into the lungs every 6 (six) hours as needed for wheezing or shortness of breath. 1 Inhaler 3  . budesonide-formoterol (SYMBICORT) 160-4.5 MCG/ACT inhaler Inhale 2 puffs into the lungs 2 (two) times daily. 1 Inhaler 11  . Ferrous Sulfate (IRON PO) Take by mouth daily.    . folic acid (FOLVITE) 1 MG tablet Take 1 tablet (1 mg total) by mouth daily. 30 tablet 3  . gabapentin (NEURONTIN) 300 MG capsule Take one capsule in the morning, one in afternoon/early evening and two at bedtime 120 capsule 3  . Ipratropium-Albuterol (COMBIVENT IN) Inhale 1 puff into the lungs 3 (three) times daily.    Marland Kitchen omeprazole (PRILOSEC) 20 MG capsule Take 1 capsule (20 mg total) by mouth daily. To reduce stomach acid 30 capsule 3  . propranolol ER (INDERAL LA) 60 MG 24 hr capsule Take 1 capsule (60 mg total) by mouth daily. For blood pressure and to lower heart rate 30 capsule 4  . thiamine (VITAMIN B-1) 100 MG tablet Take 1 tablet (100 mg total) by mouth daily. 30 tablet 3  . TURMERIC PO Take 250 mg by mouth daily.    . Ascorbic Acid (VITAMIN C/ROSE HIPS CR PO) Take by mouth.    Marland Kitchen  Cholecalciferol (VITAMIN D3 PO) Take by mouth daily.    Marland Kitchen. doxycycline (VIBRA-TABS) 100 MG tablet Take 1 tablet (100 mg total) by mouth 2 (two) times daily. (Patient not taking: Reported on 07/21/2018) 20 tablet 0   No current facility-administered medications on file prior to visit.     Observations/Objective: Awake, alert, oriented x3 Not in acute distress  Assessment and Plan: 1. COPD with acute exacerbation (HCC) Acute exacerbation Placed on short course of prednisone Advised smoking cessation will be beneficial Incruse  Ellipta added to regimen - predniSONE (DELTASONE) 20 MG tablet; Take 1 tablet (20 mg total) by mouth 2 (two) times daily with a meal.  Dispense: 10 tablet; Refill: 0 - umeclidinium bromide (INCRUSE ELLIPTA) 62.5 MCG/INH AEPB; Inhale 1 puff into the lungs daily.  Dispense: 30 each; Refill: 3  2. Tobacco dependence Spent 3 minutes counseling on cessation and hazardous effect of ongoing tobacco use I have written a prescription for NicoDerm patches for him - nicotine (NICODERM CQ) 7 mg/24hr patch; Place 1 patch (7 mg total) onto the skin daily.  Dispense: 28 patch; Refill: 1   Follow Up Instructions: Return in about 3 weeks (around 08/11/2018) for Dr Jillyn HiddenFulp -COPD.    I discussed the assessment and treatment plan with the patient. The patient was provided an opportunity to ask questions and all were answered. The patient agreed with the plan and demonstrated an understanding of the instructions.   The patient was advised to call back or seek an in-person evaluation if the symptoms worsen or if the condition fails to improve as anticipated.     I provided 13 minutes total of non-face-to-face time during this encounter including median intraservice time, reviewing previous notes, labs, imaging, medications, management and patient verbalized understanding.     Hoy RegisterEnobong Sheffield Hawker, MD, FAAFP. [facility], [place] [phone]   07/21/2018, 10:43 AM

## 2018-08-12 ENCOUNTER — Ambulatory Visit: Payer: Self-pay | Admitting: Family Medicine

## 2018-08-19 NOTE — Progress Notes (Signed)
Opened in error

## 2018-08-25 ENCOUNTER — Telehealth: Payer: Self-pay | Admitting: Licensed Clinical Social Worker

## 2018-08-25 NOTE — Telephone Encounter (Signed)
Call placed to pt per PCP consult. LCSW left message for a return call

## 2018-10-22 ENCOUNTER — Ambulatory Visit: Payer: Self-pay | Admitting: Family Medicine

## 2018-12-16 ENCOUNTER — Other Ambulatory Visit: Payer: Self-pay

## 2019-05-21 ENCOUNTER — Other Ambulatory Visit: Payer: Self-pay

## 2019-05-21 ENCOUNTER — Emergency Department (HOSPITAL_COMMUNITY): Payer: Self-pay

## 2019-05-21 ENCOUNTER — Encounter (HOSPITAL_COMMUNITY): Payer: Self-pay

## 2019-05-21 ENCOUNTER — Emergency Department (HOSPITAL_COMMUNITY)
Admission: EM | Admit: 2019-05-21 | Discharge: 2019-05-21 | Disposition: A | Discharge status: Home / Self Care | Payer: Self-pay | Attending: Emergency Medicine | Admitting: Emergency Medicine

## 2019-05-21 DIAGNOSIS — J441 Chronic obstructive pulmonary disease with (acute) exacerbation: Secondary | ICD-10-CM

## 2019-05-21 DIAGNOSIS — F17210 Nicotine dependence, cigarettes, uncomplicated: Secondary | ICD-10-CM | POA: Insufficient documentation

## 2019-05-21 DIAGNOSIS — F10230 Alcohol dependence with withdrawal, uncomplicated: Secondary | ICD-10-CM | POA: Insufficient documentation

## 2019-05-21 DIAGNOSIS — Z79899 Other long term (current) drug therapy: Secondary | ICD-10-CM | POA: Insufficient documentation

## 2019-05-21 DIAGNOSIS — J449 Chronic obstructive pulmonary disease, unspecified: Secondary | ICD-10-CM | POA: Insufficient documentation

## 2019-05-21 DIAGNOSIS — F10930 Alcohol use, unspecified with withdrawal, uncomplicated: Secondary | ICD-10-CM

## 2019-05-21 DIAGNOSIS — I10 Essential (primary) hypertension: Secondary | ICD-10-CM | POA: Insufficient documentation

## 2019-05-21 DIAGNOSIS — F1210 Cannabis abuse, uncomplicated: Secondary | ICD-10-CM | POA: Insufficient documentation

## 2019-05-21 DIAGNOSIS — K219 Gastro-esophageal reflux disease without esophagitis: Secondary | ICD-10-CM

## 2019-05-21 DIAGNOSIS — R569 Unspecified convulsions: Secondary | ICD-10-CM | POA: Insufficient documentation

## 2019-05-21 LAB — CBC WITH DIFFERENTIAL/PLATELET
Abs Immature Granulocytes: 0.03 10*3/uL (ref 0.00–0.07)
Basophils Absolute: 0 10*3/uL (ref 0.0–0.1)
Basophils Relative: 1 %
Eosinophils Absolute: 0.2 10*3/uL (ref 0.0–0.5)
Eosinophils Relative: 4 %
HCT: 44 % (ref 39.0–52.0)
Hemoglobin: 15.4 g/dL (ref 13.0–17.0)
Immature Granulocytes: 1 %
Lymphocytes Relative: 16 %
Lymphs Abs: 0.8 10*3/uL (ref 0.7–4.0)
MCH: 34.5 pg — ABNORMAL HIGH (ref 26.0–34.0)
MCHC: 35 g/dL (ref 30.0–36.0)
MCV: 98.4 fL (ref 80.0–100.0)
Monocytes Absolute: 0.7 10*3/uL (ref 0.1–1.0)
Monocytes Relative: 14 %
Neutro Abs: 3.4 10*3/uL (ref 1.7–7.7)
Neutrophils Relative %: 64 %
Platelets: 84 10*3/uL — ABNORMAL LOW (ref 150–400)
RBC: 4.47 MIL/uL (ref 4.22–5.81)
RDW: 12.7 % (ref 11.5–15.5)
WBC: 5.3 10*3/uL (ref 4.0–10.5)
nRBC: 0 % (ref 0.0–0.2)

## 2019-05-21 LAB — COMPREHENSIVE METABOLIC PANEL
ALT: 44 U/L (ref 0–44)
AST: 82 U/L — ABNORMAL HIGH (ref 15–41)
Albumin: 3.6 g/dL (ref 3.5–5.0)
Alkaline Phosphatase: 32 U/L — ABNORMAL LOW (ref 38–126)
Anion gap: 12 (ref 5–15)
BUN: 8 mg/dL (ref 6–20)
CO2: 20 mmol/L — ABNORMAL LOW (ref 22–32)
Calcium: 8.8 mg/dL — ABNORMAL LOW (ref 8.9–10.3)
Chloride: 103 mmol/L (ref 98–111)
Creatinine, Ser: 0.74 mg/dL (ref 0.61–1.24)
GFR calc Af Amer: >60 mL/min (ref >60)
GFR calc non Af Amer: >60 mL/min (ref >60)
Glucose, Bld: 136 mg/dL — ABNORMAL HIGH (ref 70–99)
Potassium: 3.6 mmol/L (ref 3.5–5.1)
Sodium: 135 mmol/L (ref 135–145)
Total Bilirubin: 3 mg/dL — ABNORMAL HIGH (ref 0.3–1.2)
Total Protein: 7.2 g/dL (ref 6.5–8.1)

## 2019-05-21 LAB — MAGNESIUM: Magnesium: 1.9 mg/dL (ref 1.7–2.4)

## 2019-05-21 MED ORDER — CHLORDIAZEPOXIDE HCL 25 MG PO CAPS: ORAL_CAPSULE | ORAL | 0 refills | Status: DC

## 2019-05-21 MED ORDER — LEVETIRACETAM 500 MG PO TABS: 500.0000 mg | ORAL_TABLET | Freq: Two times a day (BID) | ORAL | 1 refills | Status: DC

## 2019-05-21 MED ORDER — LEVETIRACETAM IN NACL 1000 MG/100ML IV SOLN
1000.0000 mg | Freq: Once | INTRAVENOUS | Status: AC
  Administered 2019-05-21: 1000 mg via INTRAVENOUS
  Filled 2019-05-21: qty 100

## 2019-05-21 MED ORDER — BUDESONIDE-FORMOTEROL FUMARATE 160-4.5 MCG/ACT IN AERO: 2.0000 | INHALATION_SPRAY | Freq: Two times a day (BID) | RESPIRATORY_TRACT | 1 refills | Status: DC

## 2019-05-21 MED ORDER — CHLORDIAZEPOXIDE HCL 25 MG PO CAPS
50.0000 mg | ORAL_CAPSULE | Freq: Once | ORAL | Status: AC
  Administered 2019-05-21: 50 mg via ORAL
  Filled 2019-05-21: qty 2

## 2019-05-21 MED ORDER — THIAMINE HCL 100 MG/ML IJ SOLN
100.0000 mg | Freq: Once | INTRAMUSCULAR | Status: AC
  Administered 2019-05-21: 100 mg via INTRAVENOUS
  Filled 2019-05-21: qty 2

## 2019-05-21 MED ORDER — SODIUM CHLORIDE 0.9 % IV BOLUS
500.0000 mL | Freq: Once | INTRAVENOUS | Status: AC
  Administered 2019-05-21: 500 mL via INTRAVENOUS

## 2019-05-21 MED ORDER — LORAZEPAM 1 MG PO TABS
2.0000 mg | ORAL_TABLET | Freq: Once | ORAL | Status: AC
  Administered 2019-05-21: 12:00:00 2 mg via ORAL
  Filled 2019-05-21: qty 2

## 2019-05-21 NOTE — ED Triage Notes (Signed)
Patient was found in back seat after having a seizure, per EMS patient seemed post ictal Hx: COPD, "stopped drinking 5 days ago" AOx2 initially when EMS arrived, AOx4 now    18 L FA 500 mL NS   96% SpO2 82 HR 118 CBG 80/50 initially - 500cc bolus - 104 systolic afterwards  During triage assessment, patient told RN that "I have a hx of seizures I just haven't taken my medicine for years because I don't like the way I feel."

## 2019-05-21 NOTE — Discharge Instructions (Addendum)
-   According to [place] law, you can not drive unless you are seizure / syncope free for at least 6 months and under physician's care.  °  °- Please maintain precautions. Do not participate in activities where a loss of awareness could harm you or someone else. No swimming alone, no tub bathing, no hot tubs, no driving, no operating motorized vehicles (cars, ATVs, motocycles, etc), lawnmowers, power tools or firearms. No standing at heights, such as rooftops, ladders or stairs. Avoid hot objects such as stoves, heaters, open fires. Wear a helmet when riding a bicycle, scooter, skateboard, etc. and avoid areas of traffic. Set your water heater to 120 degrees or less.  °

## 2019-05-21 NOTE — ED Provider Notes (Signed)
[facility]-EMERGENCY DEPT Provider Note   CSN: [id number] Arrival date & time: 05/21/19  1141     History No chief complaint on file.   UNNAMED Jacob Hill is a 49 y.o. male.  HPI 49 year old male presents with seizure.  He had a seizure in the car witnessed by friends.  Patient states he stopped drinking alcohol about 5 or 6 days ago because of his wife.  He initially was confused but now is alert and oriented.  Patient states that he typically drinks 2 40 ounce beers per day up until about 5 or 6 days ago.  Feels a little shaky after the seizure but otherwise has not noticed tremors.  No headache prior to the seizure but does have 1 now.  No fevers or neck stiffness.  Chronic leg neuropathy that is unchanged.  Otherwise no new weakness/numbness.  No recent illness such as vomiting or abdominal pain.  He has had seizures before, sparingly and usually are not related to alcohol. Used to be on dilantin a while ago.   Past Medical History:  Diagnosis Date  . Alcohol abuse   . Cirrhosis, alcoholic (HCC) 2013   seen with mild splenomegaly on CT 2013.  Hep ABC negative in 2013.   . Coagulopathy (HCC) 09/2011   elevated PT/INR  . COPD (chronic obstructive pulmonary disease) (HCC)   . DJD (degenerative joint disease)   . GERD (gastroesophageal reflux disease)   . Hypertension   . Marijuana abuse   . Neuropathy   . Opiate dependence (HCC) 2010   treated with Methadone in past.   . Osteoarthritis   . Seizures (HCC)   . Thrombocytopenia (HCC) 09/2013  . Tobacco abuse     Patient Active Problem List   Diagnosis Date Noted  . Injury of chest wall 06/29/2018  . Tobacco dependence 06/14/2018  . Polysubstance abuse (HCC) 05/08/2018  . COPD with acute exacerbation (HCC) 05/08/2018  . Portal hypertensive gastropathy (HCC)   . Esophageal varices without bleeding (HCC)   . Thrombocytopenia (HCC) 11/07/2015  . Gastric varices 11/07/2015  . GERD (gastroesophageal  reflux disease)   . Alcohol abuse   . Common bile duct dilation   . Chronic RUQ pain   . LFT elevation   . Cocaine abuse (HCC)   . Generalized anxiety disorder 05/11/2014  . Pernicious anemia 11/03/2013  . Essential hypertension, benign 06/30/2012  . Alcoholic cirrhosis of liver (HCC) 02/05/2012  . History of renal calculi 11/21/2011  . Seizure disorder (HCC) 11/21/2011  . DJD (degenerative joint disease), ankle and foot 11/08/2010  . Idiopathic peripheral neuropathy 01/06/2009  . COPD exacerbation (HCC) 12/23/2007  . Convulsions (HCC) 12/23/2007    Past Surgical History:  Procedure Laterality Date  . ESOPHAGOGASTRODUODENOSCOPY (EGD) WITH PROPOFOL N/A 11/08/2015   Procedure: ESOPHAGOGASTRODUODENOSCOPY (EGD) WITH PROPOFOL;  Surgeon: Rachael Fee, MD;  Location: [facility];  Service: Endoscopy;  Laterality: N/A;  . HEMORRHOID SURGERY         Family History  Problem Relation Age of Onset  . Diabetes Other   . Hypertension Other   . Diabetes Father     Social History   Tobacco Use  . Smoking status: Current Every Day Smoker    Packs/day: 0.50    Years: 15.00    Pack years: 7.50    Types: Cigarettes  . Smokeless tobacco: Never Used  Substance Use Topics  . Alcohol use: Yes    Alcohol/week: 9.0 standard drinks    Types: 9  Cans of beer per week    Comment: No beer in 7 days  . Drug use: Yes    Types: Marijuana    Comment: Occas    Home Medications Prior to Admission medications   Medication Sig Start Date End Date Taking? Authorizing Provider  albuterol (VENTOLIN HFA) 108 (90 Base) MCG/ACT inhaler Inhale 2 puffs into the lungs every 6 (six) hours as needed for wheezing or shortness of breath. 06/11/18  Yes Fulp, Cammie, MD  omeprazole (PRILOSEC) 20 MG capsule Take 1 capsule (20 mg total) by mouth daily. To reduce stomach acid Patient taking differently: Take 20 mg by mouth daily as needed (stomach acid).  06/11/18  Yes Fulp, Cammie, MD  budesonide-formoterol  (SYMBICORT) 160-4.5 MCG/ACT inhaler Inhale 2 puffs into the lungs 2 (two) times daily. 05/21/19   Pricilla Loveless, MD  chlordiazePOXIDE (LIBRIUM) 25 MG capsule 50mg  PO TID x 1D, then 25-50mg  PO BID X 1D, then 25-50mg  PO QD X 1D 05/21/19   07/21/19, MD  doxycycline (VIBRA-TABS) 100 MG tablet Take 1 tablet (100 mg total) by mouth 2 (two) times daily. Patient not taking: Reported on 07/21/2018 06/11/18   Fulp, 06/13/18, MD  folic acid (FOLVITE) 1 MG tablet Take 1 tablet (1 mg total) by mouth daily. Patient not taking: Reported on 05/21/2019 06/11/18   06/13/18, MD  gabapentin (NEURONTIN) 300 MG capsule Take one capsule in the morning, one in afternoon/early evening and two at bedtime Patient not taking: Reported on 05/21/2019 06/11/18   Fulp, 06/13/18, MD  levETIRAcetam (KEPPRA) 500 MG tablet Take 1 tablet (500 mg total) by mouth 2 (two) times daily. 05/21/19   07/21/19, MD  nicotine (NICODERM CQ) 7 mg/24hr patch Place 1 patch (7 mg total) onto the skin daily. Patient not taking: Reported on 05/21/2019 07/21/18   09/20/18, MD  predniSONE (DELTASONE) 20 MG tablet Take 1 tablet (20 mg total) by mouth 2 (two) times daily with a meal. Patient not taking: Reported on 05/21/2019 07/21/18   09/20/18, MD  propranolol ER (INDERAL LA) 60 MG 24 hr capsule Take 1 capsule (60 mg total) by mouth daily. For blood pressure and to lower heart rate Patient not taking: Reported on 05/21/2019 06/11/18   Fulp, 06/13/18, MD  thiamine (VITAMIN B-1) 100 MG tablet Take 1 tablet (100 mg total) by mouth daily. Patient not taking: Reported on 05/21/2019 06/11/18   Fulp, Cammie, MD  umeclidinium bromide (INCRUSE ELLIPTA) 62.5 MCG/INH AEPB Inhale 1 puff into the lungs daily. Patient not taking: Reported on 05/21/2019 07/21/18   09/20/18, MD    Allergies    Codeine and Nsaids  Review of Systems   Review of Systems  Constitutional: Negative for fever.  Gastrointestinal: Positive for nausea. Negative for vomiting.   Musculoskeletal: Negative for neck pain and neck stiffness.  Neurological: Positive for seizures, numbness (chronic) and headaches. Negative for weakness.  All other systems reviewed and are negative.   Physical Exam Updated Vital Signs BP 122/80   Pulse 81   Temp 97.8 F (36.6 C) (Oral)   Resp 13   Ht 6' (1.829 m)   Wt 81.6 kg   SpO2 96%   BMI 24.41 kg/m   Physical Exam Vitals and nursing note reviewed.  Constitutional:      General: He is not in acute distress.    Appearance: He is well-developed. He is not ill-appearing or diaphoretic.  HENT:     Head: Normocephalic and atraumatic.  Right Ear: External ear normal.     Left Ear: External ear normal.     Nose: Nose normal.     Mouth/Throat:     Comments: No tongue injury Eyes:     General:        Right eye: No discharge.        Left eye: No discharge.     Extraocular Movements: Extraocular movements intact.  Cardiovascular:     Rate and Rhythm: Normal rate and regular rhythm.     Heart sounds: Normal heart sounds.  Pulmonary:     Effort: Pulmonary effort is normal.     Breath sounds: Normal breath sounds.  Abdominal:     General: There is no distension.     Palpations: Abdomen is soft.     Tenderness: There is no abdominal tenderness.  Musculoskeletal:     Cervical back: Normal range of motion and neck supple. No rigidity.  Skin:    General: Skin is warm and dry.  Neurological:     Mental Status: He is alert.     Motor: No tremor.     Comments: CN 3-12 grossly intact. 5/5 strength in all 4 extremities. Grossly normal sensation in arms, decreased in lower legs. Normal finger to nose.   Psychiatric:        Mood and Affect: Mood is not anxious.     ED Results / Procedures / Treatments   Labs (all labs ordered are listed, but only abnormal results are displayed) Labs Reviewed  CBC WITH DIFFERENTIAL/PLATELET - Abnormal; Notable for the following components:      Result Value   MCH 34.5 (*)     Platelets 84 (*)    All other components within normal limits  COMPREHENSIVE METABOLIC PANEL - Abnormal; Notable for the following components:   CO2 20 (*)    Glucose, Bld 136 (*)    Calcium 8.8 (*)    AST 82 (*)    Alkaline Phosphatase 32 (*)    Total Bilirubin 3.0 (*)    All other components within normal limits  MAGNESIUM  RAPID URINE DRUG SCREEN, HOSP PERFORMED    EKG EKG Interpretation  Date/Time:  Friday May 21 2019 11:52:12 EDT Ventricular Rate:  88 PR Interval:    QRS Duration: 99 QT Interval:  420 QTC Calculation: 509 R Axis:   60 Text Interpretation: Sinus rhythm Prolonged QT interval PVCs no longer present compared to 2020 Confirmed by Sherwood Gambler [phone]) on 05/21/2019 12:15:53 PM   Radiology CT HEAD WO CONTRAST  Result Date: 05/21/2019 CLINICAL DATA:  Seizure EXAM: CT HEAD WITHOUT CONTRAST TECHNIQUE: Contiguous axial images were obtained from the base of the skull through the vertex without intravenous contrast. COMPARISON:  Maxillofacial CT 07/24/2015 FINDINGS: Brain: No evidence of acute infarction, hemorrhage, hydrocephalus, extra-axial collection or mass lesion/mass effect. Vascular: No hyperdense vessel or unexpected calcification. Skull: Normal. Negative for fracture or focal lesion. Sinuses/Orbits: No acute finding. Other: None. IMPRESSION: No acute intracranial findings. Electronically Signed   By: Davina Poke D.O.   On: 05/21/2019 13:02    Procedures Procedures (including critical care time)  Medications Ordered in ED Medications  thiamine (B-1) injection 100 mg (100 mg Intravenous Given 05/21/19 1223)  LORazepam (ATIVAN) tablet 2 mg (2 mg Oral Given 05/21/19 1225)  sodium chloride 0.9 % bolus 500 mL (0 mLs Intravenous Stopped 05/21/19 1330)  chlordiazePOXIDE (LIBRIUM) capsule 50 mg (50 mg Oral Given 05/21/19 1356)  levETIRAcetam (KEPPRA) IVPB 1000 mg/100 mL premix (0  mg Intravenous Stopped 05/21/19 1425)    ED Course  I have reviewed the triage vital  signs and the nursing notes.  Pertinent labs & imaging results that were available during my care of the patient were reviewed by me and considered in my medical decision making (see chart for details).    MDM Rules/Calculators/A&P                      Patient has had no further seizure-like activity.  He is awake, alert, in no acute distress.  He reports some mild symptoms of withdrawal but on exam, there is no tremor, hypertension, tachycardia, sweating, etc.  He also last drink alcohol about 5 or 6 days ago so this seems like it would be pretty delayed for an alcohol withdrawal seizure events what it was.  Further chart review indicates that he has had seizures on and off for quite some time and probably has a true seizure disorder.  Used to be on Dilantin but did not tolerate it very well.  I discussed with pharmacy, Keppra should be okay with his liver disease and so I will prescribe this.  We will also refill his Symbicort at his request.  He states he has no plans to drink alcohol anymore so we will also give Librium taper.  Otherwise, follow-up with neurology.  Counseled on no driving.  No signs or symptoms of CNS infection or other infection/emergent condition. Final Clinical Impression(s) / ED Diagnoses Final diagnoses:  Seizure (HCC)  Alcohol withdrawal syndrome without complication (HCC)    Rx / DC Orders ED Discharge Orders         Ordered    chlordiazePOXIDE (LIBRIUM) 25 MG capsule     05/21/19 1424    levETIRAcetam (KEPPRA) 500 MG tablet  2 times daily     05/21/19 1424    budesonide-formoterol (SYMBICORT) 160-4.5 MCG/ACT inhaler  2 times daily    Note to Pharmacy: Patient will likely need PASS program- needs to pick up RX tomorrow as he is s/p hospital discharge   05/21/19 1424    Ambulatory referral to Neurology    Comments: An appointment is requested in approximately: 2 weeks   05/21/19 1425           Pricilla Loveless, MD 05/21/19 1437

## 2019-06-03 ENCOUNTER — Telehealth: Payer: Self-pay | Admitting: Family Medicine

## 2019-06-03 NOTE — Telephone Encounter (Signed)
Patients fiance called into the office and stated that the patient is having trouble with his COPD and wanted something called in to help clear it up. Please follow up at your earliest convenience.  [facility] - [place], [place] - [address] [place] [address]  [address] [place] [place] [id number]

## 2019-06-04 NOTE — Telephone Encounter (Signed)
If patient is having a COPD exacerbation or other concerns, he would need an office visit to address this so that he can be medically evaluated.   Marcy Siren, D.O. Primary Care at [facility]  06/04/2019, 9:17 AM

## 2019-06-04 NOTE — Telephone Encounter (Signed)
Spoke with patient fiance and informed her to please have patient call office and she verbalized understanding. Per Fiance she's not at home right now and will have patient call office.

## 2019-06-04 NOTE — Telephone Encounter (Signed)
Informed patient with what provider stated and he verbalized understanding.  

## 2019-06-10 ENCOUNTER — Encounter: Payer: Self-pay | Admitting: Critical Care Medicine

## 2019-06-10 ENCOUNTER — Encounter: Payer: Self-pay | Admitting: Neurology

## 2019-06-10 ENCOUNTER — Other Ambulatory Visit: Payer: Self-pay

## 2019-06-10 ENCOUNTER — Ambulatory Visit: Payer: Self-pay | Attending: Critical Care Medicine | Admitting: Critical Care Medicine

## 2019-06-10 ENCOUNTER — Ambulatory Visit: Payer: Self-pay | Admitting: Family Medicine

## 2019-06-10 DIAGNOSIS — G40909 Epilepsy, unspecified, not intractable, without status epilepticus: Secondary | ICD-10-CM

## 2019-06-10 DIAGNOSIS — K219 Gastro-esophageal reflux disease without esophagitis: Secondary | ICD-10-CM

## 2019-06-10 DIAGNOSIS — F1010 Alcohol abuse, uncomplicated: Secondary | ICD-10-CM

## 2019-06-10 DIAGNOSIS — I864 Gastric varices: Secondary | ICD-10-CM

## 2019-06-10 DIAGNOSIS — F17200 Nicotine dependence, unspecified, uncomplicated: Secondary | ICD-10-CM

## 2019-06-10 DIAGNOSIS — J441 Chronic obstructive pulmonary disease with (acute) exacerbation: Secondary | ICD-10-CM

## 2019-06-10 DIAGNOSIS — K7030 Alcoholic cirrhosis of liver without ascites: Secondary | ICD-10-CM

## 2019-06-10 DIAGNOSIS — F1410 Cocaine abuse, uncomplicated: Secondary | ICD-10-CM

## 2019-06-10 DIAGNOSIS — I8510 Secondary esophageal varices without bleeding: Secondary | ICD-10-CM

## 2019-06-10 DIAGNOSIS — F411 Generalized anxiety disorder: Secondary | ICD-10-CM

## 2019-06-10 MED ORDER — VITAMIN B-1 100 MG PO TABS: 100.0000 mg | ORAL_TABLET | Freq: Every day | ORAL | 3 refills | Status: DC

## 2019-06-10 MED ORDER — LEVETIRACETAM 500 MG PO TABS: 500.0000 mg | ORAL_TABLET | Freq: Two times a day (BID) | ORAL | 1 refills | Status: DC

## 2019-06-10 MED ORDER — PROPRANOLOL HCL ER 60 MG PO CP24: 60.0000 mg | ORAL_CAPSULE | Freq: Every day | ORAL | 4 refills | Status: DC

## 2019-06-10 MED ORDER — PREDNISONE 20 MG PO TABS: 20.0000 mg | ORAL_TABLET | Freq: Two times a day (BID) | ORAL | 0 refills | Status: DC

## 2019-06-10 MED ORDER — FOLIC ACID 1 MG PO TABS: 1.0000 mg | ORAL_TABLET | Freq: Every day | ORAL | 3 refills | Status: DC

## 2019-06-10 MED ORDER — OMEPRAZOLE 20 MG PO CPDR: 20.0000 mg | DELAYED_RELEASE_CAPSULE | Freq: Every day | ORAL | 3 refills | Status: DC

## 2019-06-10 MED ORDER — ANORO ELLIPTA 62.5-25 MCG/INH IN AEPB: 1.0000 | INHALATION_SPRAY | Freq: Every day | RESPIRATORY_TRACT | 6 refills | Status: DC

## 2019-06-10 MED ORDER — BUDESONIDE-FORMOTEROL FUMARATE 160-4.5 MCG/ACT IN AERO: 2.0000 | INHALATION_SPRAY | Freq: Two times a day (BID) | RESPIRATORY_TRACT | 11 refills | Status: DC

## 2019-06-10 MED ORDER — ALBUTEROL SULFATE HFA 108 (90 BASE) MCG/ACT IN AERS: 2.0000 | INHALATION_SPRAY | Freq: Four times a day (QID) | RESPIRATORY_TRACT | 3 refills | Status: DC | PRN

## 2019-06-10 MED FILL — predniSONE 20 MG TABS: 20 | 5 days supply | Qty: 10 | Fill #0

## 2019-06-10 MED FILL — ANORO ELLIPTA 62.5-25 MCG I: 62.5-25 | 30 days supply | Qty: 60 | Fill #0

## 2019-06-10 MED FILL — FOLIC ACID 1 MG TABS: 1 | 30 days supply | Qty: 30 | Fill #0

## 2019-06-10 MED FILL — ALBUTEROL SULFATE HFA 108 (: 108 (90 BAS | 25 days supply | Qty: 18 | Fill #0

## 2019-06-10 MED FILL — PROPRANOLOL ER 60 MG CAP: 60 | 30 days supply | Qty: 30 | Fill #0

## 2019-06-10 MED FILL — levETIRAcetam 500 MG TABS: 500 | 30 days supply | Qty: 60 | Fill #0

## 2019-06-10 MED FILL — OMEPRAZOLE 20 MG CAP: 20 | 30 days supply | Qty: 30 | Fill #0

## 2019-06-10 NOTE — Assessment & Plan Note (Signed)
Alcoholic cirrhosis of the liver with gastropathy and portal hypertension  Recent hemoglobin was normal  We will reestablish with gastroenterology

## 2019-06-10 NOTE — Progress Notes (Signed)
Subjective:    Patient ID: Jacob Hill, male    DOB: 11/09/1970, 49 y.o.   MRN: [id number] Virtual Visit via Telephone Note  I connected with [name] on 06/10/19 at  9:30 AM EDT by telephone and verified that I am speaking with the correct person using two identifiers.   Consent:  I discussed the limitations, risks, security and privacy concerns of performing an evaluation and management service by telephone and the availability of in person appointments. I also discussed with the patient that there may be a patient responsible charge related to this service. The patient expressed understanding and agreed to proceed.  Location of patient: The patient was at home  Location of provider: I was in my office  Persons participating in the televisit with the patient.    The patient's fianc [name] was with him on the phone   History of Present Illness: 49 y.o.M  Not seen in this clinic since 2020 Patient was recently seen in the emergency room with history of seizure type activity and alcohol withdrawal.  Patient had been binge drinking and the fianc noted he was blanking out more.  He denies any bleeding as he does have history of alcoholic cirrhosis with esophageal and gastric varices and portal hypertension.  Patient also has COPD with ongoing tobacco use and has been having increased shortness of breath as you no longer has his inhalers.  He formerly used Symbicort and Combivent but is out of these medications at this time.  He has not been drinking since that visit in early April to the ER over the past 28 days.  He is also not used cocaine in over a year.  Emergency room gave the patient Librium and a tapering dose and also Keppra 500 mg twice daily he does have some Keppra left will be needed reversed refill soon.  He formally saw neurology and gastroenterology would like appointments reestablished.  He now does have insurance at this time.  He does have some chronic pain  in the lower back.   Past Medical History:  Diagnosis Date  . Alcohol abuse   . Cirrhosis, alcoholic ([place]) 3893   seen with mild splenomegaly on CT 2013.  Hep ABC negative in 2013.   . Coagulopathy ([place]) 09/2011   elevated PT/INR  . COPD (chronic obstructive pulmonary disease) ([place])   . DJD (degenerative joint disease)   . GERD (gastroesophageal reflux disease)   . Hypertension   . Marijuana abuse   . Neuropathy   . Opiate dependence ([place]) 2010   treated with Methadone in past.   . Osteoarthritis   . Seizures ([place])   . Thrombocytopenia ([place]) 09/2013  . Tobacco abuse      Family History  Problem Relation Age of Onset  . Diabetes Other   . Hypertension Other   . Diabetes Father      Social History   Socioeconomic History  . Marital status: Single    Spouse name: Not on file  . Number of children: Not on file  . Years of education: Not on file  . Highest education level: Not on file  Occupational History  . Not on file  Tobacco Use  . Smoking status: Current Every Day Smoker    Packs/day: 0.50    Years: 15.00    Pack years: 7.50    Types: Cigarettes  . Smokeless tobacco: Never Used  Substance and Sexual Activity  . Alcohol use: Yes    Alcohol/week:  9.0 standard drinks    Types: 9 Cans of beer per week    Comment: No beer in 7 days  . Drug use: Yes    Types: Marijuana    Comment: Occas  . Sexual activity: Not Currently  Other Topics Concern  . Not on file  Social History Narrative  . Not on file   Social Determinants of Health   Financial Resource Strain:   . Difficulty of Paying Living Expenses:   Food Insecurity:   . Worried About Programme researcher, broadcasting/film/video in the Last Year:   . Barista in the Last Year:   Transportation Needs:   . Freight forwarder (Medical):   Marland Kitchen Lack of Transportation (Non-Medical):   Physical Activity:   . Days of Exercise per Week:   . Minutes of Exercise per Session:   Stress:   . Feeling of Stress :   Social  Connections:   . Frequency of Communication with Friends and Family:   . Frequency of Social Gatherings with Friends and Family:   . Attends Religious Services:   . Active Member of Clubs or Organizations:   . Attends Banker Meetings:   Marland Kitchen Marital Status:   Intimate Partner Violence:   . Fear of Current or Ex-Partner:   . Emotionally Abused:   Marland Kitchen Physically Abused:   . Sexually Abused:      Allergies  Allergen Reactions  . Codeine Nausea And Vomiting  . Nsaids Nausea And Vomiting     Outpatient Medications Prior to Visit  Medication Sig Dispense Refill  . albuterol (VENTOLIN HFA) 108 (90 Base) MCG/ACT inhaler Inhale 2 puffs into the lungs every 6 (six) hours as needed for wheezing or shortness of breath. 1 Inhaler 3  . budesonide-formoterol (SYMBICORT) 160-4.5 MCG/ACT inhaler Inhale 2 puffs into the lungs 2 (two) times daily. 1 Inhaler 1  . chlordiazePOXIDE (LIBRIUM) 25 MG capsule 50mg  PO TID x 1D, then 25-50mg  PO BID X 1D, then 25-50mg  PO QD X 1D 10 capsule 0  . levETIRAcetam (KEPPRA) 500 MG tablet Take 1 tablet (500 mg total) by mouth 2 (two) times daily. 60 tablet 1  . thiamine (VITAMIN B-1) 100 MG tablet Take 1 tablet (100 mg total) by mouth daily. 30 tablet 3  . nicotine (NICODERM CQ) 7 mg/24hr patch Place 1 patch (7 mg total) onto the skin daily. (Patient not taking: Reported on 05/21/2019) 28 patch 1  . doxycycline (VIBRA-TABS) 100 MG tablet Take 1 tablet (100 mg total) by mouth 2 (two) times daily. (Patient not taking: Reported on 07/21/2018) 20 tablet 0  . folic acid (FOLVITE) 1 MG tablet Take 1 tablet (1 mg total) by mouth daily. (Patient not taking: Reported on 05/21/2019) 30 tablet 3  . gabapentin (NEURONTIN) 300 MG capsule Take one capsule in the morning, one in afternoon/early evening and two at bedtime (Patient not taking: Reported on 05/21/2019) 120 capsule 3  . omeprazole (PRILOSEC) 20 MG capsule Take 1 capsule (20 mg total) by mouth daily. To reduce stomach acid  (Patient not taking: Reported on 06/10/2019) 30 capsule 3  . predniSONE (DELTASONE) 20 MG tablet Take 1 tablet (20 mg total) by mouth 2 (two) times daily with a meal. (Patient not taking: Reported on 05/21/2019) 10 tablet 0  . propranolol ER (INDERAL LA) 60 MG 24 hr capsule Take 1 capsule (60 mg total) by mouth daily. For blood pressure and to lower heart rate (Patient not taking: Reported on 06/10/2019) 30  capsule 4  . umeclidinium bromide (INCRUSE ELLIPTA) 62.5 MCG/INH AEPB Inhale 1 puff into the lungs daily. (Patient not taking: Reported on 05/21/2019) 30 each 3   No facility-administered medications prior to visit.      Review of Systems  Constitutional: Positive for activity change.  HENT: Positive for postnasal drip, rhinorrhea, sinus pain and sore throat.   Respiratory: Positive for cough, shortness of breath and wheezing.   Gastrointestinal: Negative.   Genitourinary: Negative.   Musculoskeletal: Positive for back pain.  Neurological: Positive for seizures.       Objective:   Physical Exam No exam is this is a telephone visit       Assessment & Plan:  I personally reviewed all images and lab data in the [facility] system as well as any outside material available during this office visit and agree with the  radiology impressions.   Seizure disorder (HCC) Alcohol-related seizure disorder now off Librium but maintaining Keppra twice daily 500 mg  Patient is no longer drinking alcohol  Plan will be to refer to neurology for further assessment note CT scan recently was negative  I will refill the Keppra 500 mg twice daily  The patient will need short-term follow-up in the next 2 weeks with lab follow-up studies  Alcoholic cirrhosis of liver (HCC) Alcoholic cirrhosis of the liver with gastropathy and portal hypertension  Recent hemoglobin was normal  We will reestablish with gastroenterology  Alcohol abuse Ongoing alcohol use currently abstaining for the past 28 days  We will  connect this patient with our licensed clinical social worker  Cocaine abuse (HCC) History of cocaine use currently not using over the past year will need this patient to have a drug screen when he comes into the office  Tobacco dependence Ongoing tobacco use I counseled the patient on this at this visit and will refill his inhalers  COPD with acute exacerbation (HCC) COPD with mild exacerbation  Steroids not indicated  Will give the patient Anoro and Ventolin refills   Diagnoses and all orders for this visit:  Seizure disorder (HCC) -     Ambulatory referral to Neurology  COPD with acute exacerbation (HCC) -     albuterol (VENTOLIN HFA) 108 (90 Base) MCG/ACT inhaler; Inhale 2 puffs into the lungs every 6 (six) hours as needed for wheezing or shortness of breath. -     Discontinue: budesonide-formoterol (SYMBICORT) 160-4.5 MCG/ACT inhaler; Inhale 2 puffs into the lungs 2 (two) times daily. -     predniSONE (DELTASONE) 20 MG tablet; Take 1 tablet (20 mg total) by mouth 2 (two) times daily with a meal.  Gastroesophageal reflux disease without esophagitis -     albuterol (VENTOLIN HFA) 108 (90 Base) MCG/ACT inhaler; Inhale 2 puffs into the lungs every 6 (six) hours as needed for wheezing or shortness of breath. -     Discontinue: budesonide-formoterol (SYMBICORT) 160-4.5 MCG/ACT inhaler; Inhale 2 puffs into the lungs 2 (two) times daily. -     folic acid (FOLVITE) 1 MG tablet; Take 1 tablet (1 mg total) by mouth daily. -     omeprazole (PRILOSEC) 20 MG capsule; Take 1 capsule (20 mg total) by mouth daily. To reduce stomach acid -     propranolol ER (INDERAL LA) 60 MG 24 hr capsule; Take 1 capsule (60 mg total) by mouth daily. For blood pressure and to lower heart rate -     thiamine (VITAMIN B-1) 100 MG tablet; Take 1 tablet (100 mg total) by  mouth daily.  Alcoholic cirrhosis of liver without ascites (HCC) -     folic acid (FOLVITE) 1 MG tablet; Take 1 tablet (1 mg total) by mouth  daily. -     thiamine (VITAMIN B-1) 100 MG tablet; Take 1 tablet (100 mg total) by mouth daily. -     Ambulatory referral to Gastroenterology  Generalized anxiety disorder -     propranolol ER (INDERAL LA) 60 MG 24 hr capsule; Take 1 capsule (60 mg total) by mouth daily. For blood pressure and to lower heart rate  Gastric varices -     Ambulatory referral to Gastroenterology  Secondary esophageal varices without bleeding (HCC) -     Ambulatory referral to Gastroenterology  Alcohol abuse -     Ambulatory referral to Neurology  Cocaine abuse (HCC)  Tobacco dependence  Other orders -     levETIRAcetam (KEPPRA) 500 MG tablet; Take 1 tablet (500 mg total) by mouth 2 (two) times daily. -     umeclidinium-vilanterol (ANORO ELLIPTA) 62.5-25 MCG/INH AEPB; Inhale 1 puff into the lungs daily.   Follow Up Instructions: The patient knows a follow-up visit will be obtained in the next few weeks and refills will be sent to the patient   I discussed the assessment and treatment plan with the patient. The patient was provided an opportunity to ask questions and all were answered. The patient agreed with the plan and demonstrated an understanding of the instructions.   The patient was advised to call back or seek an in-person evaluation if the symptoms worsen or if the condition fails to improve as anticipated.  I provided of non-face-to-face time during this encounter  including  median intraservice time , review of notes, labs, imaging, medications  and explaining diagnosis and management to the patient .    Shan Levans, MD

## 2019-06-10 NOTE — Assessment & Plan Note (Signed)
History of cocaine use currently not using over the past year will need this patient to have a drug screen when he comes into the office

## 2019-06-10 NOTE — Assessment & Plan Note (Signed)
Ongoing alcohol use currently abstaining for the past 28 days  We will connect this patient with our licensed clinical social worker

## 2019-06-10 NOTE — Assessment & Plan Note (Signed)
Alcohol-related seizure disorder now off Librium but maintaining Keppra twice daily 500 mg  Patient is no longer drinking alcohol  Plan will be to refer to neurology for further assessment note CT scan recently was negative  I will refill the Keppra 500 mg twice daily  The patient will need short-term follow-up in the next 2 weeks with lab follow-up studies

## 2019-06-10 NOTE — Assessment & Plan Note (Signed)
COPD with mild exacerbation  Steroids not indicated  Will give the patient Anoro and Ventolin refills

## 2019-06-10 NOTE — Assessment & Plan Note (Signed)
Ongoing tobacco use I counseled the patient on this at this visit and will refill his inhalers

## 2019-06-10 NOTE — Progress Notes (Signed)
HFU for foot problem and COPD and seizure   Med refills gabapentin, prednisone for COPD, Symbicort, BP meds, heart burn meds, combivent, seizure med  Wants the Albuterol changed out to [employer] to Pediatrist back in 2017

## 2019-08-02 ENCOUNTER — Encounter: Payer: Self-pay | Admitting: Critical Care Medicine

## 2019-08-11 ENCOUNTER — Other Ambulatory Visit: Payer: Self-pay

## 2019-08-11 ENCOUNTER — Emergency Department (HOSPITAL_COMMUNITY)
Admission: EM | Admit: 2019-08-11 | Discharge: 2019-08-11 | Disposition: A | Discharge status: Home / Self Care | Payer: 59 | Attending: Emergency Medicine | Admitting: Emergency Medicine

## 2019-08-11 ENCOUNTER — Encounter (HOSPITAL_COMMUNITY): Payer: Self-pay | Admitting: Emergency Medicine

## 2019-08-11 DIAGNOSIS — Y939 Activity, unspecified: Secondary | ICD-10-CM | POA: Insufficient documentation

## 2019-08-11 DIAGNOSIS — R251 Tremor, unspecified: Secondary | ICD-10-CM | POA: Diagnosis not present

## 2019-08-11 DIAGNOSIS — Y999 Unspecified external cause status: Secondary | ICD-10-CM | POA: Insufficient documentation

## 2019-08-11 DIAGNOSIS — S8010XA Contusion of unspecified lower leg, initial encounter: Secondary | ICD-10-CM | POA: Insufficient documentation

## 2019-08-11 DIAGNOSIS — F1510 Other stimulant abuse, uncomplicated: Secondary | ICD-10-CM | POA: Diagnosis not present

## 2019-08-11 DIAGNOSIS — Z609 Problem related to social environment, unspecified: Secondary | ICD-10-CM | POA: Diagnosis not present

## 2019-08-11 DIAGNOSIS — Z886 Allergy status to analgesic agent status: Secondary | ICD-10-CM | POA: Diagnosis not present

## 2019-08-11 DIAGNOSIS — F1010 Alcohol abuse, uncomplicated: Secondary | ICD-10-CM

## 2019-08-11 DIAGNOSIS — I10 Essential (primary) hypertension: Secondary | ICD-10-CM | POA: Diagnosis not present

## 2019-08-11 DIAGNOSIS — Z7951 Long term (current) use of inhaled steroids: Secondary | ICD-10-CM | POA: Diagnosis not present

## 2019-08-11 DIAGNOSIS — Y909 Presence of alcohol in blood, level not specified: Secondary | ICD-10-CM | POA: Diagnosis not present

## 2019-08-11 DIAGNOSIS — R000 Tachycardia, unspecified: Secondary | ICD-10-CM | POA: Diagnosis not present

## 2019-08-11 DIAGNOSIS — F17210 Nicotine dependence, cigarettes, uncomplicated: Secondary | ICD-10-CM | POA: Diagnosis not present

## 2019-08-11 DIAGNOSIS — K7030 Alcoholic cirrhosis of liver without ascites: Secondary | ICD-10-CM | POA: Insufficient documentation

## 2019-08-11 DIAGNOSIS — Y929 Unspecified place or not applicable: Secondary | ICD-10-CM | POA: Insufficient documentation

## 2019-08-11 DIAGNOSIS — F10129 Alcohol abuse with intoxication, unspecified: Secondary | ICD-10-CM | POA: Diagnosis not present

## 2019-08-11 DIAGNOSIS — Z79899 Other long term (current) drug therapy: Secondary | ICD-10-CM | POA: Insufficient documentation

## 2019-08-11 DIAGNOSIS — X58XXXA Exposure to other specified factors, initial encounter: Secondary | ICD-10-CM | POA: Diagnosis not present

## 2019-08-11 DIAGNOSIS — Z885 Allergy status to narcotic agent status: Secondary | ICD-10-CM | POA: Diagnosis not present

## 2019-08-11 DIAGNOSIS — J441 Chronic obstructive pulmonary disease with (acute) exacerbation: Secondary | ICD-10-CM | POA: Insufficient documentation

## 2019-08-11 MED ORDER — LORAZEPAM 1 MG PO TABS
2.0000 mg | ORAL_TABLET | Freq: Once | ORAL | Status: AC
  Administered 2019-08-11: 2 mg via ORAL
  Filled 2019-08-11: qty 2

## 2019-08-11 MED ORDER — LEVETIRACETAM 500 MG PO TABS
500.0000 mg | ORAL_TABLET | Freq: Once | ORAL | Status: AC
  Administered 2019-08-11: 500 mg via ORAL
  Filled 2019-08-11: qty 1

## 2019-08-11 NOTE — Progress Notes (Signed)
TOC CM reviewed chart, and pt has insurance coverage for his medications. Isidoro Donning RN CCM, [facility] ED TOC CM [phone]

## 2019-08-11 NOTE — ED Triage Notes (Addendum)
Patient here from home requesting detox from alcohol. Denies other drug use. Hx of seizures. States last drink was today. Denies SI/HI.

## 2019-08-11 NOTE — ED Provider Notes (Signed)
[facility]-EMERGENCY DEPT Provider Note   CSN: [id number] Arrival date & time: 08/11/19  1038     History Chief Complaint  Patient presents with  . Alcohol Intoxication  . Detox    Jacob Hill is a 49 y.o. male.  Patient is a 49 year old male with a history of alcoholic cirrhosis, alcohol abuse, seizure disorder supposed to be on Keppra and COPD who is presenting today requesting detox from alcohol.  Patient reports he was clean for 40 days in April and was doing well and then started drinking again.  He has had some social stress and things going on at his home and is having issues with his girlfriend but he reports she is coming back now that he has decided to seek treatment and stop drinking.  He denies any suicidal or homicidal ideation and denies being depressed.  He states he just needs help to stop drinking and does not want to go through withdrawal.  He states in the past when he has attempted to stop drinking he has more seizures.  However he is supposed to be taking Keppra but reported he could not handle the co-pay and never picked it up so has not had Keppra in months.  Today he reports he last had one beer to drink earlier this morning but feels like he is starting to become shaky.  He denies any chest pain or shortness of breath at this time.  Reports that he was at [facility] yesterday and they did find him a place to go for detox but [place] a [place] he did not feel comfortable going to.  The history is provided by the patient.  Alcohol Intoxication This is a recurrent problem.       Past Medical History:  Diagnosis Date  . Alcohol abuse   . Cirrhosis, alcoholic (HCC) 2013   seen with mild splenomegaly on CT 2013.  Hep ABC negative in 2013.   . Coagulopathy (HCC) 09/2011   elevated PT/INR  . COPD (chronic obstructive pulmonary disease) (HCC)   . DJD (degenerative joint disease)   . GERD (gastroesophageal reflux disease)   .  Hypertension   . Marijuana abuse   . Neuropathy   . Opiate dependence (HCC) 2010   treated with Methadone in past.   . Osteoarthritis   . Seizures (HCC)   . Thrombocytopenia (HCC) 09/2013  . Tobacco abuse     Patient Active Problem List   Diagnosis Date Noted  . Tobacco dependence 06/14/2018  . Polysubstance abuse (HCC) 05/08/2018  . COPD with acute exacerbation (HCC) 05/08/2018  . Portal hypertensive gastropathy (HCC)   . Esophageal varices without bleeding (HCC)   . Thrombocytopenia (HCC) 11/07/2015  . Gastric varices 11/07/2015  . GERD (gastroesophageal reflux disease)   . Alcohol abuse   . Common bile duct dilation   . Chronic RUQ pain   . Cocaine abuse (HCC)   . Generalized anxiety disorder 05/11/2014  . Pernicious anemia 11/03/2013  . Essential hypertension, benign 06/30/2012  . Alcoholic cirrhosis of liver (HCC) 02/05/2012  . History of renal calculi 11/21/2011  . Seizure disorder (HCC) 11/21/2011  . DJD (degenerative joint disease), ankle and foot 11/08/2010  . Idiopathic peripheral neuropathy 01/06/2009  . COPD exacerbation (HCC) 12/23/2007    Past Surgical History:  Procedure Laterality Date  . ESOPHAGOGASTRODUODENOSCOPY (EGD) WITH PROPOFOL N/A 11/08/2015   Procedure: ESOPHAGOGASTRODUODENOSCOPY (EGD) WITH PROPOFOL;  Surgeon: Rachael Fee, MD;  Location: [facility] END[facility];  Service: Endoscopy;  Laterality: N/A;  . HEMORRHOID SURGERY         Family History  Problem Relation Age of Onset  . Diabetes Other   . Hypertension Other   . Diabetes Father     Social History   Tobacco Use  . Smoking status: Current Every Day Smoker    Packs/day: 0.50    Years: 15.00    Pack years: 7.50    Types: Cigarettes  . Smokeless tobacco: Never Used  Vaping Use  . Vaping Use: Never used  Substance Use Topics  . Alcohol use: Yes    Alcohol/week: 9.0 standard drinks    Types: 9 Cans of beer per week    Comment: No beer in 7 days  . Drug use: Yes    Types: Marijuana      Comment: Occas    Home Medications Prior to Admission medications   Medication Sig Start Date End Date Taking? Authorizing Provider  albuterol (VENTOLIN HFA) 108 (90 Base) MCG/ACT inhaler Inhale 2 puffs into the lungs every 6 (six) hours as needed for wheezing or shortness of breath. 06/10/19   Elsie Stain, MD  folic acid (FOLVITE) 1 MG tablet Take 1 tablet (1 mg total) by mouth daily. 06/10/19   Elsie Stain, MD  levETIRAcetam (KEPPRA) 500 MG tablet Take 1 tablet (500 mg total) by mouth 2 (two) times daily. 06/10/19   Elsie Stain, MD  nicotine (NICODERM CQ) 7 mg/24hr patch Place 1 patch (7 mg total) onto the skin daily. Patient not taking: Reported on 05/21/2019 07/21/18   Charlott Rakes, MD  omeprazole (PRILOSEC) 20 MG capsule Take 1 capsule (20 mg total) by mouth daily. To reduce stomach acid 06/10/19   Elsie Stain, MD  predniSONE (DELTASONE) 20 MG tablet Take 1 tablet (20 mg total) by mouth 2 (two) times daily with a meal. 06/10/19   Elsie Stain, MD  propranolol ER (INDERAL LA) 60 MG 24 hr capsule Take 1 capsule (60 mg total) by mouth daily. For blood pressure and to lower heart rate 06/10/19   Elsie Stain, MD  thiamine (VITAMIN B-1) 100 MG tablet Take 1 tablet (100 mg total) by mouth daily. 06/10/19   Elsie Stain, MD  umeclidinium-vilanterol (ANORO ELLIPTA) 62.5-25 MCG/INH AEPB Inhale 1 puff into the lungs daily. 06/10/19   Elsie Stain, MD    Allergies    Codeine and Nsaids  Review of Systems   Review of Systems  All other systems reviewed and are negative.   Physical Exam Updated Vital Signs BP (!) 159/135   Pulse (!) 122   Temp 98.4 F (36.9 C) (Oral)   Resp 19   SpO2 93%   Physical Exam Vitals and nursing note reviewed.  Constitutional:      General: He is not in acute distress.    Appearance: He is well-developed and normal weight.     Comments: Slightly disheveled  HENT:     Head: Normocephalic and atraumatic.      Mouth/Throat:     Mouth: Mucous membranes are moist.  Eyes:     Conjunctiva/sclera: Conjunctivae normal.     Pupils: Pupils are equal, round, and reactive to light.  Cardiovascular:     Rate and Rhythm: Regular rhythm. Tachycardia present.     Heart sounds: No murmur heard.   Pulmonary:     Effort: Pulmonary effort is normal. No respiratory distress.     Breath sounds: Normal breath sounds. No wheezing or rales.  Abdominal:     General: There is no distension.     Palpations: Abdomen is soft.     Tenderness: There is no abdominal tenderness. There is no guarding or rebound.  Musculoskeletal:        General: No tenderness. Normal range of motion.     Cervical back: Normal range of motion and neck supple.     Right lower leg: No edema.     Left lower leg: No edema.     Comments: Small bruises present over the lower extremities in various stages of healing  Skin:    General: Skin is warm and dry.     Findings: No erythema or rash.  Neurological:     General: No focal deficit present.     Mental Status: He is alert and oriented to person, place, and time. Mental status is at baseline.  Psychiatric:        Behavior: Behavior normal.        Thought Content: Thought content does not include homicidal or suicidal ideation.     ED Results / Procedures / Treatments   Labs (all labs ordered are listed, but only abnormal results are displayed) Labs Reviewed - No data to display  EKG None  Radiology No results found.  Procedures Procedures (including critical care time)  Medications Ordered in ED Medications  LORazepam (ATIVAN) tablet 2 mg (has no administration in time range)    ED Course  I have reviewed the triage vital signs and the nursing notes.  Pertinent labs & imaging results that were available during my care of the patient were reviewed by me and considered in my medical decision making (see chart for details).    MDM Rules/Calculators/A&P                           Patient presenting today requesting alcohol detox.  Patient reports he last had one beer to drink earlier today.  Patient does not appear intoxicated at this time and is hypertensive and tachycardic and reporting that he is feeling shaky and jittery and may be going through a component of withdrawal but does not have significant tremor on exam.  Patient does have a history of seizure disorder but has not taken his Keppra in months and is concerned he will have another seizure if he stops drinking on his own.  He denies any suicidal or homicidal ideation and does not appear to need psych clearance at this time.  Discussed with patient we do not provide inpatient alcohol detox but he was given the resources that he can contact on his own to see if there are any spots available.  Will give patient 2 mg of Ativan due to the tachycardia and hypertension with concern for some alcohol withdrawal.  He denies any abdominal pain, emesis and was seen at [facility] yesterday and reported he had no significant abnormality on his labs.  Do not feel that he needs to have these rechecked today.  Will consult transitional care team to help with medication assistance.  Also will consult peer support.  Will recheck patient after he receives Ativan to ensure improvement in blood pressure and tachycardia.  Also will give him a dose of his Keppra.  1:38 PM Pt feeling better after ativan.  Peer support helping pt get into [facility].  Pt is clear for d/c with improved VS.  Final Clinical Impression(s) / ED Diagnoses Final diagnoses:  Alcohol abuse  Rx / DC Orders ED Discharge Orders    None       Gwyneth Sprout, MD 08/11/19 1339

## 2019-08-11 NOTE — Patient Outreach (Signed)
ED Peer Support Specialist Patient Intake (Complete at intake & 30-60 Day Follow-up)  Name: ABEL HAGEMAN  MRN: [id number]  Age: 49 y.o.   Date of Admission: 08/11/2019  Intake: Initial Comments:      Primary Reason Admitted: Alcohol Intoxication, Detox   Lab values: Alcohol/ETOH: Positive Positive UDS? Yes Amphetamines: No Barbiturates: No Benzodiazepines: No Cocaine: No Opiates:   Cannabinoids: No  Demographic information: Gender: Male Ethnicity: White Marital Status: Separated Insurance Status: Diplomatic Services operational officer (Work Neurosurgeon, Physicist, medical, etc.: No Lives with: Alone Living situation: House/Apartment  Reported Patient History: Patient reported health conditions: COPD, Depression Patient aware of HIV and hepatitis status: No  In past year, has patient visited ED for any reason? Yes  Number of ED visits: 3  Reason(s) for visit: Same reasons  In past year, has patient been hospitalized for any reason? No  Number of hospitalizations:    Reason(s) for hospitalization:    In past year, has patient been arrested? No  Number of arrests:    Reason(s) for arrest:    In past year, has patient been incarcerated? No  Number of incarcerations:    Reason(s) for incarceration:    In past year, has patient received medication-assisted treatment? No  In past year, patient received the following treatments: Residential treatment (non-hospital)  In past year, has patient received any harm reduction services? No  Did this include any of the following?    In past year, has patient received care from a mental health provider for diagnosis other than SUD? Yes  In past year, is this first time patient has overdosed? No  Number of past overdoses:    In past year, is this first time patient has been hospitalized for an overdose? No  Number of hospitalizations for overdose(s):    Is patient currently receiving treatment  for a mental health diagnosis? No  Patient reports experiencing difficulty participating in SUD treatment: No    Most important reason(s) for this difficulty?    Has patient received prior services for treatment? No  In past, patient has received services from following agencies:    Plan of Care:  Suggested follow up at these agencies/treatment centers: Other (comment) ([facility] services.)  Other information: [facility]PSS met with Pt an was made aware that he wants assistance with his Alcohol intake. CPSS processed with Pt about a few options that Pt may benefit from. CPSS assisted Pt with getting into [facility].    Aaron Edelman A[place], [place]  08/11/2019 12:49 PM

## 2019-08-11 NOTE — ED Notes (Signed)
Given contact  Info from [facility] per [place] .

## 2019-08-14 ENCOUNTER — Emergency Department (HOSPITAL_COMMUNITY): Payer: 59

## 2019-08-14 ENCOUNTER — Inpatient Hospital Stay (HOSPITAL_COMMUNITY)
Admission: EM | Admit: 2019-08-14 | Discharge: 2019-08-16 | DRG: 556 | Disposition: A | Discharge status: Home / Self Care | Payer: 59 | Attending: Internal Medicine | Admitting: Internal Medicine

## 2019-08-14 ENCOUNTER — Encounter (HOSPITAL_COMMUNITY): Payer: Self-pay

## 2019-08-14 DIAGNOSIS — J449 Chronic obstructive pulmonary disease, unspecified: Secondary | ICD-10-CM | POA: Diagnosis present

## 2019-08-14 DIAGNOSIS — I8510 Secondary esophageal varices without bleeding: Secondary | ICD-10-CM | POA: Diagnosis present

## 2019-08-14 DIAGNOSIS — R1011 Right upper quadrant pain: Secondary | ICD-10-CM | POA: Diagnosis present

## 2019-08-14 DIAGNOSIS — Z833 Family history of diabetes mellitus: Secondary | ICD-10-CM

## 2019-08-14 DIAGNOSIS — F10130 Alcohol abuse with withdrawal, uncomplicated: Secondary | ICD-10-CM | POA: Diagnosis present

## 2019-08-14 DIAGNOSIS — Z79899 Other long term (current) drug therapy: Secondary | ICD-10-CM

## 2019-08-14 DIAGNOSIS — R109 Unspecified abdominal pain: Secondary | ICD-10-CM | POA: Diagnosis present

## 2019-08-14 DIAGNOSIS — K838 Other specified diseases of biliary tract: Secondary | ICD-10-CM | POA: Diagnosis present

## 2019-08-14 DIAGNOSIS — I8500 Esophageal varices without bleeding: Secondary | ICD-10-CM | POA: Diagnosis present

## 2019-08-14 DIAGNOSIS — F17210 Nicotine dependence, cigarettes, uncomplicated: Secondary | ICD-10-CM | POA: Diagnosis present

## 2019-08-14 DIAGNOSIS — R0789 Other chest pain: Secondary | ICD-10-CM | POA: Diagnosis not present

## 2019-08-14 DIAGNOSIS — M7918 Myalgia, other site: Secondary | ICD-10-CM | POA: Diagnosis not present

## 2019-08-14 DIAGNOSIS — K219 Gastro-esophageal reflux disease without esophagitis: Secondary | ICD-10-CM | POA: Diagnosis present

## 2019-08-14 DIAGNOSIS — D696 Thrombocytopenia, unspecified: Secondary | ICD-10-CM | POA: Diagnosis present

## 2019-08-14 DIAGNOSIS — I10 Essential (primary) hypertension: Secondary | ICD-10-CM | POA: Diagnosis present

## 2019-08-14 DIAGNOSIS — Z87442 Personal history of urinary calculi: Secondary | ICD-10-CM

## 2019-08-14 DIAGNOSIS — Z20822 Contact with and (suspected) exposure to covid-19: Secondary | ICD-10-CM | POA: Diagnosis present

## 2019-08-14 DIAGNOSIS — G40909 Epilepsy, unspecified, not intractable, without status epilepticus: Secondary | ICD-10-CM | POA: Diagnosis present

## 2019-08-14 DIAGNOSIS — F17200 Nicotine dependence, unspecified, uncomplicated: Secondary | ICD-10-CM | POA: Diagnosis present

## 2019-08-14 DIAGNOSIS — F1010 Alcohol abuse, uncomplicated: Secondary | ICD-10-CM | POA: Diagnosis present

## 2019-08-14 DIAGNOSIS — Z8249 Family history of ischemic heart disease and other diseases of the circulatory system: Secondary | ICD-10-CM

## 2019-08-14 DIAGNOSIS — K7030 Alcoholic cirrhosis of liver without ascites: Secondary | ICD-10-CM | POA: Diagnosis present

## 2019-08-14 LAB — CBC WITH DIFFERENTIAL/PLATELET
Abs Immature Granulocytes: 0.03 10*3/uL (ref 0.00–0.07)
Basophils Absolute: 0 10*3/uL (ref 0.0–0.1)
Basophils Relative: 1 %
Eosinophils Absolute: 0.1 10*3/uL (ref 0.0–0.5)
Eosinophils Relative: 1 %
HCT: 41.4 % (ref 39.0–52.0)
Hemoglobin: 14.5 g/dL (ref 13.0–17.0)
Immature Granulocytes: 1 %
Lymphocytes Relative: 27 %
Lymphs Abs: 1.6 10*3/uL (ref 0.7–4.0)
MCH: 33 pg (ref 26.0–34.0)
MCHC: 35 g/dL (ref 30.0–36.0)
MCV: 94.3 fL (ref 80.0–100.0)
Monocytes Absolute: 1.1 10*3/uL — ABNORMAL HIGH (ref 0.1–1.0)
Monocytes Relative: 19 %
Neutro Abs: 3 10*3/uL (ref 1.7–7.7)
Neutrophils Relative %: 51 %
Platelets: 77 10*3/uL — ABNORMAL LOW (ref 150–400)
RBC: 4.39 MIL/uL (ref 4.22–5.81)
RDW: 14 % (ref 11.5–15.5)
WBC: 5.8 10*3/uL (ref 4.0–10.5)
nRBC: 0 % (ref 0.0–0.2)

## 2019-08-14 LAB — LIPASE, BLOOD: Lipase: 109 U/L — ABNORMAL HIGH (ref 11–51)

## 2019-08-14 LAB — BASIC METABOLIC PANEL
Anion gap: 12 (ref 5–15)
BUN: 5 mg/dL — ABNORMAL LOW (ref 6–20)
CO2: 21 mmol/L — ABNORMAL LOW (ref 22–32)
Calcium: 9.5 mg/dL (ref 8.9–10.3)
Chloride: 102 mmol/L (ref 98–111)
Creatinine, Ser: 0.6 mg/dL — ABNORMAL LOW (ref 0.61–1.24)
GFR calc Af Amer: >60 mL/min (ref >60)
GFR calc non Af Amer: >60 mL/min (ref >60)
Glucose, Bld: 88 mg/dL (ref 70–99)
Potassium: 3.6 mmol/L (ref 3.5–5.1)
Sodium: 135 mmol/L (ref 135–145)

## 2019-08-14 LAB — HEPATIC FUNCTION PANEL
ALT: 89 U/L — ABNORMAL HIGH (ref 0–44)
AST: 168 U/L — ABNORMAL HIGH (ref 15–41)
Albumin: 3.7 g/dL (ref 3.5–5.0)
Alkaline Phosphatase: 46 U/L (ref 38–126)
Bilirubin, Direct: 0.6 mg/dL — ABNORMAL HIGH (ref 0.0–0.2)
Indirect Bilirubin: 1.7 mg/dL — ABNORMAL HIGH (ref 0.3–0.9)
Total Bilirubin: 2.3 mg/dL — ABNORMAL HIGH (ref 0.3–1.2)
Total Protein: 7.4 g/dL (ref 6.5–8.1)

## 2019-08-14 LAB — D-DIMER, QUANTITATIVE: D-Dimer, Quant: 2.14 ug/mL-FEU — ABNORMAL HIGH (ref 0.00–0.50)

## 2019-08-14 LAB — ETHANOL: Alcohol, Ethyl (B): 89 mg/dL — ABNORMAL HIGH (ref <10)

## 2019-08-14 MED ORDER — MORPHINE SULFATE (PF) 4 MG/ML IV SOLN
4.0000 mg | Freq: Once | INTRAVENOUS | Status: AC
  Administered 2019-08-14: 4 mg via INTRAVENOUS
  Filled 2019-08-14: qty 1

## 2019-08-14 MED ORDER — ONDANSETRON HCL 4 MG/2ML IJ SOLN
4.0000 mg | Freq: Once | INTRAMUSCULAR | Status: AC
  Administered 2019-08-14: 4 mg via INTRAVENOUS
  Filled 2019-08-14: qty 2

## 2019-08-14 MED ORDER — LORAZEPAM 2 MG/ML IJ SOLN
1.0000 mg | Freq: Once | INTRAMUSCULAR | Status: AC
  Administered 2019-08-15: 1 mg via INTRAVENOUS
  Filled 2019-08-14: qty 1

## 2019-08-14 MED ORDER — FENTANYL CITRATE (PF) 100 MCG/2ML IJ SOLN
50.0000 ug | Freq: Once | INTRAMUSCULAR | Status: AC
  Administered 2019-08-14: 50 ug via INTRAVENOUS
  Filled 2019-08-14: qty 2

## 2019-08-14 MED ORDER — SODIUM CHLORIDE 0.9 % IV BOLUS
500.0000 mL | Freq: Once | INTRAVENOUS | Status: AC
  Administered 2019-08-14: 500 mL via INTRAVENOUS

## 2019-08-14 NOTE — ED Triage Notes (Signed)
Pt came in [facility] from home c/o [facility] and Right Sided Flank Pain. Pt was d/c this morning form [facility]. He came home, was using his inhaler however, had no relief of his SHOB. 150/94, 98%RA, 92HR, 20RR 18GLFA

## 2019-08-14 NOTE — ED Provider Notes (Signed)
[facility] EMERGENCY DEPARTMENT Provider Note   CSN: [id number] Arrival date & time: 08/14/19  1943     History Chief Complaint  Patient presents with  . Chest Pain  . Abdominal Pain    Jacob Hill is a 49 y.o. male.  HPI   49 year old male with a history of EtOH abuse, cirrhosis, COPD, GERD, hypertension, opiate dependence and, osteoarthritis, seizures, thrombocytopenia, tobacco abuse, who presents in the emergency department today for evaluation of right upper quadrant pain and right chest wall pain.  States pain has been present for the last several days.  Pain is worse with inspiration and certain movements.  It is also worse with palpation.  He has had some associated nausea and vomiting.  He also feels short of breath.  He denies any chest pain.  He does admit to alcohol use.  Reviewed records, patient was seen in the [facility] regional ED prior to arrival and had thorough work-up including 2 negative troponins, reassuring EKG and chest x-ray.  Also had elevated LFTs and pattern suggestive of chronic EtOH use.  Past Medical History:  Diagnosis Date  . Alcohol abuse   . Cirrhosis, alcoholic (HCC) 2013   seen with mild splenomegaly on CT 2013.  Hep ABC negative in 2013.   . Coagulopathy (HCC) 09/2011   elevated PT/INR  . COPD (chronic obstructive pulmonary disease) (HCC)   . DJD (degenerative joint disease)   . GERD (gastroesophageal reflux disease)   . Hypertension   . Marijuana abuse   . Neuropathy   . Opiate dependence (HCC) 2010   treated with Methadone in past.   . Osteoarthritis   . Seizures (HCC)   . Thrombocytopenia (HCC) 09/2013  . Tobacco abuse     Patient Active Problem List   Diagnosis Date Noted  . Tobacco dependence 06/14/2018  . Polysubstance abuse (HCC) 05/08/2018  . COPD with acute exacerbation (HCC) 05/08/2018  . Portal hypertensive gastropathy (HCC)   . Esophageal varices without bleeding (HCC)   . Thrombocytopenia  (HCC) 11/07/2015  . Gastric varices 11/07/2015  . GERD (gastroesophageal reflux disease)   . Alcohol abuse   . Common bile duct dilation   . Chronic RUQ pain   . Cocaine abuse (HCC)   . Generalized anxiety disorder 05/11/2014  . Pernicious anemia 11/03/2013  . Essential hypertension, benign 06/30/2012  . Alcoholic cirrhosis of liver (HCC) 02/05/2012  . History of renal calculi 11/21/2011  . Seizure disorder (HCC) 11/21/2011  . DJD (degenerative joint disease), ankle and foot 11/08/2010  . Idiopathic peripheral neuropathy 01/06/2009  . COPD exacerbation (HCC) 12/23/2007    Past Surgical History:  Procedure Laterality Date  . ESOPHAGOGASTRODUODENOSCOPY (EGD) WITH PROPOFOL N/A 11/08/2015   Procedure: ESOPHAGOGASTRODUODENOSCOPY (EGD) WITH PROPOFOL;  Surgeon: Rachael Fee, MD;  Location: [facility] ENDOSCOPY;  Service: Endoscopy;  Laterality: N/A;  . HEMORRHOID SURGERY         Family History  Problem Relation Age of Onset  . Diabetes Other   . Hypertension Other   . Diabetes Father     Social History   Tobacco Use  . Smoking status: Current Every Day Smoker    Packs/day: 0.50    Years: 15.00    Pack years: 7.50    Types: Cigarettes  . Smokeless tobacco: Never Used  Vaping Use  . Vaping Use: Never used  Substance Use Topics  . Alcohol use: Yes    Alcohol/week: 9.0 standard drinks    Types: 9 Cans of  beer per week    Comment: No beer in 7 days  . Drug use: Yes    Types: Marijuana    Comment: Occas    Home Medications Prior to Admission medications   Medication Sig Start Date End Date Taking? Authorizing Provider  albuterol (VENTOLIN HFA) 108 (90 Base) MCG/ACT inhaler Inhale 2 puffs into the lungs every 6 (six) hours as needed for wheezing or shortness of breath. 06/10/19  Yes Storm Frisk, MD  levETIRAcetam (KEPPRA) 500 MG tablet Take 1 tablet (500 mg total) by mouth 2 (two) times daily. 06/10/19  Yes Storm Frisk, MD  umeclidinium-vilanterol (ANORO ELLIPTA)  62.5-25 MCG/INH AEPB Inhale 1 puff into the lungs daily. 06/10/19  Yes Storm Frisk, MD  folic acid (FOLVITE) 1 MG tablet Take 1 tablet (1 mg total) by mouth daily. 06/10/19   Storm Frisk, MD  nicotine (NICODERM CQ) 7 mg/24hr patch Place 1 patch (7 mg total) onto the skin daily. Patient not taking: Reported on 05/21/2019 07/21/18   Hoy Register, MD  omeprazole (PRILOSEC) 20 MG capsule Take 1 capsule (20 mg total) by mouth daily. To reduce stomach acid 06/10/19   Storm Frisk, MD  propranolol ER (INDERAL LA) 60 MG 24 hr capsule Take 1 capsule (60 mg total) by mouth daily. For blood pressure and to lower heart rate 06/10/19   Storm Frisk, MD  thiamine (VITAMIN B-1) 100 MG tablet Take 1 tablet (100 mg total) by mouth daily. 06/10/19   Storm Frisk, MD    Allergies    Codeine and Nsaids  Review of Systems   Review of Systems  Constitutional: Negative for chills and fever.  HENT: Negative for ear pain and sore throat.   Eyes: Negative for pain and visual disturbance.  Respiratory: Positive for cough and shortness of breath.   Cardiovascular: Positive for chest pain (right chest wall pain). Negative for palpitations.  Gastrointestinal: Positive for abdominal pain, nausea and vomiting. Negative for constipation and diarrhea.  Genitourinary: Negative for dysuria and hematuria.  Musculoskeletal: Negative for back pain.  Skin: Negative for rash.  Neurological: Negative for seizures and syncope.  All other systems reviewed and are negative.   Physical Exam Updated Vital Signs BP (!) 153/94 (BP Location: Right Arm)   Pulse 90   Temp 98.6 F (37 C) (Oral)   Resp 10   Ht 6' (1.829 m)   Wt 65.8 kg   SpO2 96%   BMI 19.67 kg/m   Physical Exam Vitals and nursing note reviewed.  Constitutional:      Appearance: He is well-developed.     Comments: Thin, chronically appearing male  HENT:     Head: Normocephalic and atraumatic.  Eyes:     Conjunctiva/sclera:  Conjunctivae normal.  Cardiovascular:     Rate and Rhythm: Normal rate and regular rhythm.     Heart sounds: Normal heart sounds. No murmur heard.   Pulmonary:     Effort: Pulmonary effort is normal. No respiratory distress.     Breath sounds: Normal breath sounds. No wheezing, rhonchi or rales.  Chest:     Chest wall: Tenderness (right chest wall TTP with small area of ecchymosis to the chest wall) present.  Abdominal:     General: Bowel sounds are normal.     Palpations: Abdomen is soft.     Tenderness: There is abdominal tenderness (RUQ). There is no guarding or rebound.  Musculoskeletal:     Cervical back: Neck supple.  Skin:    General: Skin is warm and dry.  Neurological:     Mental Status: He is alert.     ED Results / Procedures / Treatments   Labs (all labs ordered are listed, but only abnormal results are displayed) Labs Reviewed  CBC WITH DIFFERENTIAL/PLATELET - Abnormal; Notable for the following components:      Result Value   Platelets 77 (*)    Monocytes Absolute 1.1 (*)    All other components within normal limits  BASIC METABOLIC PANEL - Abnormal; Notable for the following components:   CO2 21 (*)    BUN 5 (*)    Creatinine, Ser 0.60 (*)    All other components within normal limits  HEPATIC FUNCTION PANEL - Abnormal; Notable for the following components:   AST 168 (*)    ALT 89 (*)    Total Bilirubin 2.3 (*)    Bilirubin, Direct 0.6 (*)    Indirect Bilirubin 1.7 (*)    All other components within normal limits  LIPASE, BLOOD - Abnormal; Notable for the following components:   Lipase 109 (*)    All other components within normal limits  ETHANOL - Abnormal; Notable for the following components:   Alcohol, Ethyl (B) 89 (*)    All other components within normal limits  D-DIMER, QUANTITATIVE (NOT AT [facility]) - Abnormal; Notable for the following components:   D-Dimer, Quant 2.14 (*)    All other components within normal limits    EKG EKG  Interpretation  Date/Time:  Saturday August 14 2019 19:48:46 EDT Ventricular Rate:  81 PR Interval:    QRS Duration: 92 QT Interval:  406 QTC Calculation: 472 R Axis:   75 Text Interpretation: Sinus rhythm When compared to prior, no significant changes seen. NO STEMI Confirmed by Theda Belfast ([id number]) on 08/14/2019 8:41:36 PM   Radiology DG Chest 2 View  Result Date: 08/14/2019 CLINICAL DATA:  Right chest pain near the diaphragm for the past 3 days, with associated shortness of breath. History of cirrhosis of the liver, COPD and hypertension. Smoker. EXAM: CHEST - 2 VIEW COMPARISON:  Earlier today. FINDINGS: Normal sized heart. Clear lungs with normal vascularity. The lungs remain hyperexpanded with mild prominence of the interstitial markings. Minimal thoracic spine degenerative changes and minimal scoliosis. IMPRESSION: No acute abnormality.  Mild changes of COPD. Electronically Signed   By: Beckie Salts M.D.   On: 08/14/2019 20:09   DG Ribs Unilateral Right  Result Date: 08/14/2019 CLINICAL DATA:  Right side pain EXAM: RIGHT RIBS - 2 VIEW COMPARISON:  08/14/2019 FINDINGS: No acute rib fracture. Old healed anterior 5th rib fracture. Right lung clear. No effusions or pneumothorax. IMPRESSION: No acute rib fracture. Electronically Signed   By: Charlett Nose M.D.   On: 08/14/2019 20:39   US Abdomen Limited RUQ  Result Date: 08/14/2019 CLINICAL DATA:  Right upper quadrant abdominal pain. EXAM: ULTRASOUND ABDOMEN LIMITED RIGHT UPPER QUADRANT COMPARISON:  None. FINDINGS: Gallbladder: There is diffuse gallbladder wall thickening with the gallbladder wall measuring up to approximately 4 mm in thickness. There are no gallstones. The gallbladder is relatively under distended. The sonographic Eulah Pont sign is negative. Common bile duct: Diameter: 10 mm Liver: The hepatic parenchyma is coarsened and heterogeneous in appearance. There is some mild intrahepatic biliary ductal dilatation. The liver surface  appears somewhat nodular. Portal vein is patent on color Doppler imaging with normal direction of blood flow towards the liver. Other: None. IMPRESSION: 1. There is nonspecific gallbladder wall thickening  in the absence of cholelithiasis. This can be seen in patients with ascites or underlying hepatocellular disease. 2. Dilated common bile duct with suggestion of intrahepatic ductal dilatation. Correlation with laboratory studies is recommended. 3. Coarsened and heterogeneous appearance of the liver raises concern for cirrhosis. Electronically Signed   By: Constance Holster M.D.   On: 08/14/2019 21:23    Procedures Procedures (including critical care time)  Medications Ordered in ED Medications  LORazepam (ATIVAN) injection 1 mg (has no administration in time range)  fentaNYL (SUBLIMAZE) injection 50 mcg (50 mcg Intravenous Given 08/14/19 2040)  sodium chloride 0.9 % bolus 500 mL (0 mLs Intravenous Stopped 08/14/19 2128)  ondansetron (ZOFRAN) injection 4 mg (4 mg Intravenous Given 08/14/19 2041)  morphine 4 MG/ML injection 4 mg (4 mg Intravenous Given 08/14/19 2341)  ondansetron (ZOFRAN) injection 4 mg (4 mg Intravenous Given 08/14/19 2341)    ED Course  I have reviewed the triage vital signs and the nursing notes.  Pertinent labs & imaging results that were available during my care of the patient were reviewed by me and considered in my medical decision making (see chart for details).    MDM Rules/Calculators/A&P                          49 year old male presenting for evaluation of right chest wall pain, right upper quadrant pain.  Has history of chronic alcohol use and cirrhosis.  Vital signs reassuring.  Does have some tenderness to the right chest wall and also the right upper quadrant.  CBC is without looks cytosis or anemia BMP with normal kidney function and electrolytes Liver enzymes Lipase marginally elevated EtOH marginally elevated D-dimer elevated   EKG Sinus rhythm When  compared to prior, no significant changes seen. NO STEMI   CXR with right ribs shows no acute abnormality, COPD changes which are chronic.  No right rib fracture.  RUQ [place]: There is nonspecific gallbladder wall thickening in the absence of cholelithiasis. This can be seen in patients with ascites or underlying hepatocellular disease. Dilated common bile duct with suggestion of intrahepatic ductal dilatation. Coarsened and heterogeneous appearance of the liver raises concern for cirrhosis.  - will discuss with GI regarding ductal dilatation   11:39 PM CONSULT With Dr. Benson Norway with gastroenterology who recommends MRCP.   At shift change, pt pending CTA chest. Care signed out to Quincy Carnes, PA-C with plan to f/u on CTA chest and admit for MRCP.   Final Clinical Impression(s) / ED Diagnoses Final diagnoses:  Chest wall pain  RUQ pain    Rx / DC Orders ED Discharge Orders    None       Rodney Booze, PA-C 08/14/19 2350    Tegeler, Gwenyth Allegra, MD 08/15/19 0028

## 2019-08-15 ENCOUNTER — Observation Stay (HOSPITAL_COMMUNITY): Payer: 59

## 2019-08-15 ENCOUNTER — Emergency Department (HOSPITAL_COMMUNITY): Payer: 59

## 2019-08-15 DIAGNOSIS — M7918 Myalgia, other site: Secondary | ICD-10-CM | POA: Diagnosis present

## 2019-08-15 DIAGNOSIS — F17210 Nicotine dependence, cigarettes, uncomplicated: Secondary | ICD-10-CM | POA: Diagnosis present

## 2019-08-15 DIAGNOSIS — K838 Other specified diseases of biliary tract: Secondary | ICD-10-CM | POA: Diagnosis not present

## 2019-08-15 DIAGNOSIS — F1010 Alcohol abuse, uncomplicated: Secondary | ICD-10-CM | POA: Diagnosis not present

## 2019-08-15 DIAGNOSIS — M549 Dorsalgia, unspecified: Secondary | ICD-10-CM

## 2019-08-15 DIAGNOSIS — K7030 Alcoholic cirrhosis of liver without ascites: Secondary | ICD-10-CM

## 2019-08-15 DIAGNOSIS — F1020 Alcohol dependence, uncomplicated: Secondary | ICD-10-CM | POA: Diagnosis not present

## 2019-08-15 DIAGNOSIS — R1011 Right upper quadrant pain: Secondary | ICD-10-CM

## 2019-08-15 DIAGNOSIS — Z79899 Other long term (current) drug therapy: Secondary | ICD-10-CM | POA: Diagnosis not present

## 2019-08-15 DIAGNOSIS — G40909 Epilepsy, unspecified, not intractable, without status epilepticus: Secondary | ICD-10-CM | POA: Diagnosis present

## 2019-08-15 DIAGNOSIS — F10130 Alcohol abuse with withdrawal, uncomplicated: Secondary | ICD-10-CM | POA: Diagnosis present

## 2019-08-15 DIAGNOSIS — Z20822 Contact with and (suspected) exposure to covid-19: Secondary | ICD-10-CM | POA: Diagnosis present

## 2019-08-15 DIAGNOSIS — K219 Gastro-esophageal reflux disease without esophagitis: Secondary | ICD-10-CM | POA: Diagnosis present

## 2019-08-15 DIAGNOSIS — R0789 Other chest pain: Secondary | ICD-10-CM | POA: Diagnosis present

## 2019-08-15 DIAGNOSIS — R10A1 Flank pain, right side: Secondary | ICD-10-CM | POA: Diagnosis present

## 2019-08-15 DIAGNOSIS — I10 Essential (primary) hypertension: Secondary | ICD-10-CM

## 2019-08-15 DIAGNOSIS — R109 Unspecified abdominal pain: Secondary | ICD-10-CM | POA: Diagnosis present

## 2019-08-15 DIAGNOSIS — J449 Chronic obstructive pulmonary disease, unspecified: Secondary | ICD-10-CM | POA: Diagnosis present

## 2019-08-15 DIAGNOSIS — Z833 Family history of diabetes mellitus: Secondary | ICD-10-CM | POA: Diagnosis not present

## 2019-08-15 DIAGNOSIS — I8510 Secondary esophageal varices without bleeding: Secondary | ICD-10-CM | POA: Diagnosis present

## 2019-08-15 DIAGNOSIS — Z8249 Family history of ischemic heart disease and other diseases of the circulatory system: Secondary | ICD-10-CM | POA: Diagnosis not present

## 2019-08-15 LAB — CBC
HCT: 39.2 % (ref 39.0–52.0)
Hemoglobin: 13.8 g/dL (ref 13.0–17.0)
MCH: 33.3 pg (ref 26.0–34.0)
MCHC: 35.2 g/dL (ref 30.0–36.0)
MCV: 94.7 fL (ref 80.0–100.0)
Platelets: 74 10*3/uL — ABNORMAL LOW (ref 150–400)
RBC: 4.14 MIL/uL — ABNORMAL LOW (ref 4.22–5.81)
RDW: 14.3 % (ref 11.5–15.5)
WBC: 5.3 10*3/uL (ref 4.0–10.5)
nRBC: 0 % (ref 0.0–0.2)

## 2019-08-15 LAB — URINALYSIS, ROUTINE W REFLEX MICROSCOPIC
Bilirubin Urine: NEGATIVE
Glucose, UA: NEGATIVE mg/dL
Hgb urine dipstick: NEGATIVE
Ketones, ur: NEGATIVE mg/dL
Leukocytes,Ua: NEGATIVE
Nitrite: NEGATIVE
Protein, ur: NEGATIVE mg/dL
Specific Gravity, Urine: 1.044 — ABNORMAL HIGH (ref 1.005–1.030)
pH: 7 (ref 5.0–8.0)

## 2019-08-15 LAB — SARS CORONAVIRUS 2 BY RT PCR (HOSPITAL ORDER, PERFORMED IN ~~LOC~~ HOSPITAL LAB): SARS Coronavirus 2: NEGATIVE

## 2019-08-15 LAB — COMPREHENSIVE METABOLIC PANEL
ALT: 82 U/L — ABNORMAL HIGH (ref 0–44)
AST: 146 U/L — ABNORMAL HIGH (ref 15–41)
Albumin: 3.3 g/dL — ABNORMAL LOW (ref 3.5–5.0)
Alkaline Phosphatase: 46 U/L (ref 38–126)
Anion gap: 12 (ref 5–15)
BUN: <5 mg/dL — ABNORMAL LOW (ref 6–20)
CO2: 23 mmol/L (ref 22–32)
Calcium: 9 mg/dL (ref 8.9–10.3)
Chloride: 102 mmol/L (ref 98–111)
Creatinine, Ser: 0.62 mg/dL (ref 0.61–1.24)
GFR calc Af Amer: >60 mL/min (ref >60)
GFR calc non Af Amer: >60 mL/min (ref >60)
Glucose, Bld: 84 mg/dL (ref 70–99)
Potassium: 3.6 mmol/L (ref 3.5–5.1)
Sodium: 137 mmol/L (ref 135–145)
Total Bilirubin: 2.7 mg/dL — ABNORMAL HIGH (ref 0.3–1.2)
Total Protein: 6.8 g/dL (ref 6.5–8.1)

## 2019-08-15 LAB — TROPONIN I (HIGH SENSITIVITY)
Troponin I (High Sensitivity): 11 ng/L (ref <18)
Troponin I (High Sensitivity): 10 ng/L (ref <18)

## 2019-08-15 LAB — URIC ACID: Uric Acid, Serum: 4.7 mg/dL (ref 3.7–8.6)

## 2019-08-15 LAB — PHOSPHORUS: Phosphorus: 3.5 mg/dL (ref 2.5–4.6)

## 2019-08-15 LAB — MAGNESIUM: Magnesium: 1.4 mg/dL — ABNORMAL LOW (ref 1.7–2.4)

## 2019-08-15 LAB — HIV ANTIBODY (ROUTINE TESTING W REFLEX): HIV Screen 4th Generation wRfx: NONREACTIVE

## 2019-08-15 MED ORDER — HYDROMORPHONE HCL 1 MG/ML IJ SOLN
0.5000 mg | Freq: Four times a day (QID) | INTRAMUSCULAR | Status: DC | PRN
  Administered 2019-08-15: 0.5 mg via INTRAVENOUS
  Filled 2019-08-15: qty 1

## 2019-08-15 MED ORDER — ONDANSETRON HCL 4 MG/2ML IJ SOLN: 4.0000 mg | Freq: Four times a day (QID) | INTRAMUSCULAR | Status: DC | PRN

## 2019-08-15 MED ORDER — PROPRANOLOL HCL ER 60 MG PO CP24
60.0000 mg | ORAL_CAPSULE | Freq: Every day | ORAL | Status: DC
  Administered 2019-08-15 – 2019-08-16 (×2): 60 mg via ORAL
  Filled 2019-08-15 (×2): qty 1

## 2019-08-15 MED ORDER — LEVETIRACETAM 500 MG PO TABS
500.0000 mg | ORAL_TABLET | Freq: Two times a day (BID) | ORAL | Status: DC
  Administered 2019-08-15 – 2019-08-16 (×3): 500 mg via ORAL
  Filled 2019-08-15 (×3): qty 1

## 2019-08-15 MED ORDER — IOHEXOL 350 MG/ML SOLN
100.0000 mL | Freq: Once | INTRAVENOUS | Status: AC | PRN
  Administered 2019-08-15: 100 mL via INTRAVENOUS

## 2019-08-15 MED ORDER — CHLORDIAZEPOXIDE HCL 25 MG PO CAPS
25.0000 mg | ORAL_CAPSULE | Freq: Three times a day (TID) | ORAL | Status: DC
  Administered 2019-08-16: 25 mg via ORAL
  Filled 2019-08-15: qty 1

## 2019-08-15 MED ORDER — HYDROMORPHONE HCL 1 MG/ML IJ SOLN
1.0000 mg | Freq: Four times a day (QID) | INTRAMUSCULAR | Status: DC | PRN
  Administered 2019-08-15 (×2): 1 mg via INTRAVENOUS
  Filled 2019-08-15 (×2): qty 1

## 2019-08-15 MED ORDER — TAMSULOSIN HCL 0.4 MG PO CAPS
0.4000 mg | ORAL_CAPSULE | Freq: Every day | ORAL | Status: DC
  Administered 2019-08-15 – 2019-08-16 (×2): 0.4 mg via ORAL
  Filled 2019-08-15 (×2): qty 1

## 2019-08-15 MED ORDER — HYDROMORPHONE HCL 1 MG/ML IJ SOLN: 1.0000 mg | Freq: Four times a day (QID) | INTRAMUSCULAR | Status: DC

## 2019-08-15 MED ORDER — HYDROMORPHONE HCL 1 MG/ML IJ SOLN: 0.5000 mg | INTRAMUSCULAR | Status: DC | PRN

## 2019-08-15 MED ORDER — CHLORDIAZEPOXIDE HCL 25 MG PO CAPS
25.0000 mg | ORAL_CAPSULE | Freq: Four times a day (QID) | ORAL | Status: DC | PRN
Start: 2019-08-15 — End: 2019-08-15

## 2019-08-15 MED ORDER — MAGNESIUM SULFATE 2 GM/50ML IV SOLN
2.0000 g | Freq: Once | INTRAVENOUS | Status: AC
  Administered 2019-08-15: 2 g via INTRAVENOUS
  Filled 2019-08-15: qty 50

## 2019-08-15 MED ORDER — LORAZEPAM 1 MG PO TABS: 1.0000 mg | ORAL_TABLET | ORAL | Status: DC | PRN

## 2019-08-15 MED ORDER — FOLIC ACID 1 MG PO TABS
1.0000 mg | ORAL_TABLET | Freq: Every day | ORAL | Status: DC
  Administered 2019-08-15 – 2019-08-16 (×2): 1 mg via ORAL
  Filled 2019-08-15 (×2): qty 1

## 2019-08-15 MED ORDER — GADOBUTROL 1 MMOL/ML IV SOLN
6.0000 mL | Freq: Once | INTRAVENOUS | Status: AC | PRN
  Administered 2019-08-15: 6 mL via INTRAVENOUS

## 2019-08-15 MED ORDER — UMECLIDINIUM-VILANTEROL 62.5-25 MCG/INH IN AEPB
1.0000 | INHALATION_SPRAY | Freq: Every day | RESPIRATORY_TRACT | Status: DC
  Administered 2019-08-15 – 2019-08-16 (×2): 1 via RESPIRATORY_TRACT
  Filled 2019-08-15: qty 14

## 2019-08-15 MED ORDER — OXYCODONE HCL 5 MG PO TABS
5.0000 mg | ORAL_TABLET | ORAL | Status: DC | PRN
  Administered 2019-08-15 – 2019-08-16 (×4): 10 mg via ORAL
  Filled 2019-08-15 (×4): qty 2

## 2019-08-15 MED ORDER — CHLORDIAZEPOXIDE HCL 25 MG PO CAPS: 25.0000 mg | ORAL_CAPSULE | ORAL | Status: DC

## 2019-08-15 MED ORDER — CHLORDIAZEPOXIDE HCL 25 MG PO CAPS: 25.0000 mg | ORAL_CAPSULE | Freq: Every day | ORAL | Status: DC

## 2019-08-15 MED ORDER — THIAMINE HCL 100 MG PO TABS
100.0000 mg | ORAL_TABLET | Freq: Every day | ORAL | Status: DC
  Administered 2019-08-15 – 2019-08-16 (×2): 100 mg via ORAL
  Filled 2019-08-15 (×2): qty 1

## 2019-08-15 MED ORDER — CHLORDIAZEPOXIDE HCL 25 MG PO CAPS
25.0000 mg | ORAL_CAPSULE | Freq: Four times a day (QID) | ORAL | Status: AC
  Administered 2019-08-15 (×4): 25 mg via ORAL
  Filled 2019-08-15 (×4): qty 1

## 2019-08-15 MED ORDER — LACTATED RINGERS IV SOLN: INTRAVENOUS | Status: AC

## 2019-08-15 MED ORDER — UMECLIDINIUM-VILANTEROL 62.5-25 MCG/INH IN AEPB: 1.0000 | INHALATION_SPRAY | Freq: Every day | RESPIRATORY_TRACT | Status: DC

## 2019-08-15 MED ORDER — ALBUTEROL SULFATE (2.5 MG/3ML) 0.083% IN NEBU: 3.0000 mL | INHALATION_SOLUTION | Freq: Four times a day (QID) | RESPIRATORY_TRACT | Status: DC | PRN

## 2019-08-15 MED ORDER — HYDROMORPHONE HCL 1 MG/ML IJ SOLN
0.5000 mg | Freq: Four times a day (QID) | INTRAMUSCULAR | Status: DC
  Administered 2019-08-15: 0.5 mg via INTRAVENOUS
  Filled 2019-08-15: qty 1

## 2019-08-15 MED ORDER — LORAZEPAM 2 MG/ML IJ SOLN: 1.0000 mg | INTRAMUSCULAR | Status: DC | PRN

## 2019-08-15 NOTE — ED Notes (Signed)
MS Breakfast Ordered 

## 2019-08-15 NOTE — Progress Notes (Signed)
Notified on call phys- current CIWA score- unchanged from last despite having had a dose of ativan- no ativan orders at this time - continue to monitor- notify if score reaches 15

## 2019-08-15 NOTE — ED Provider Notes (Signed)
Assumed care from PA Couture at shift change.  See prior notes for full H&P.  Briefly, 49 y.o. M with hx of alcoholism, here with right sided abdominal/flank pain.  Denies chest pain.  Labs with transaminitis, slightly worse than prior.  [place] did reveal some non-specific GB wall thickening and dilated CBD.  Plan:  GI, Dr. Elnoria Howard-- wants MRCP given ductal dilatation on [place].  Will get CTA and admit.  Results for orders placed or performed during the hospital encounter of 08/14/19  CBC with Differential  Result Value Ref Range   WBC 5.8 4.0 - 10.5 K/uL   RBC 4.39 4.22 - 5.81 MIL/uL   Hemoglobin 14.5 13.0 - 17.0 g/dL   HCT 93.2 39 - 52 %   MCV 94.3 80.0 - 100.0 fL   MCH 33.0 26.0 - 34.0 pg   MCHC 35.0 30.0 - 36.0 g/dL   RDW 67.1 24.5 - 80.9 %   Platelets 77 (L) 150 - 400 K/uL   nRBC 0.0 0.0 - 0.2 %   Neutrophils Relative % 51 %   Neutro Abs 3.0 1.7 - 7.7 K/uL   Lymphocytes Relative 27 %   Lymphs Abs 1.6 0.7 - 4.0 K/uL   Monocytes Relative 19 %   Monocytes Absolute 1.1 (H) 0 - 1 K/uL   Eosinophils Relative 1 %   Eosinophils Absolute 0.1 0 - 0 K/uL   Basophils Relative 1 %   Basophils Absolute 0.0 0 - 0 K/uL   Immature Granulocytes 1 %   Abs Immature Granulocytes 0.03 0.00 - 0.07 K/uL  Basic metabolic panel  Result Value Ref Range   Sodium 135 135 - 145 mmol/L   Potassium 3.6 3.5 - 5.1 mmol/L   Chloride 102 98 - 111 mmol/L   CO2 21 (L) 22 - 32 mmol/L   Glucose, Bld 88 70 - 99 mg/dL   BUN 5 (L) 6 - 20 mg/dL   Creatinine, Ser 9.83 (L) 0.61 - 1.24 mg/dL   Calcium 9.5 8.9 - 38.2 mg/dL   GFR calc non Af Amer >60 >60 mL/min   GFR calc Af Amer >60 >60 mL/min   Anion gap 12 5 - 15  Hepatic function panel  Result Value Ref Range   Total Protein 7.4 6.5 - 8.1 g/dL   Albumin 3.7 3.5 - 5.0 g/dL   AST 505 (H) 15 - 41 U/L   ALT 89 (H) 0 - 44 U/L   Alkaline Phosphatase 46 38 - 126 U/L   Total Bilirubin 2.3 (H) 0.3 - 1.2 mg/dL   Bilirubin, Direct 0.6 (H) 0.0 - 0.2 mg/dL   Indirect Bilirubin  1.7 (H) 0.3 - 0.9 mg/dL  Lipase, blood  Result Value Ref Range   Lipase 109 (H) 11 - 51 U/L  Ethanol  Result Value Ref Range   Alcohol, Ethyl (B) 89 (H) <10 mg/dL  D-dimer, quantitative (not at [facility])  Result Value Ref Range   D-Dimer, Quant 2.14 (H) 0.00 - 0.50 ug/mL-FEU   DG Chest 2 View  Result Date: 08/14/2019 CLINICAL DATA:  Right chest pain near the diaphragm for the past 3 days, with associated shortness of breath. History of cirrhosis of the liver, COPD and hypertension. Smoker. EXAM: CHEST - 2 VIEW COMPARISON:  Earlier today. FINDINGS: Normal sized heart. Clear lungs with normal vascularity. The lungs remain hyperexpanded with mild prominence of the interstitial markings. Minimal thoracic spine degenerative changes and minimal scoliosis. IMPRESSION: No acute abnormality.  Mild changes of COPD. Electronically Signed  By: Claudie Revering M.D.   On: 08/14/2019 20:09   DG Ribs Unilateral Right  Result Date: 08/14/2019 CLINICAL DATA:  Right side pain EXAM: RIGHT RIBS - 2 VIEW COMPARISON:  08/14/2019 FINDINGS: No acute rib fracture. Old healed anterior 5th rib fracture. Right lung clear. No effusions or pneumothorax. IMPRESSION: No acute rib fracture. Electronically Signed   By: Rolm Baptise M.D.   On: 08/14/2019 20:39   CT Angio Chest PE W and/or Wo Contrast  Result Date: 08/15/2019 CLINICAL DATA:  Positive D-dimer, short of breath, right flank pain EXAM: CT ANGIOGRAPHY CHEST WITH CONTRAST TECHNIQUE: Multidetector CT imaging of the chest was performed using the standard protocol during bolus administration of intravenous contrast. Multiplanar CT image reconstructions and MIPs were obtained to evaluate the vascular anatomy. CONTRAST:  153mL OMNIPAQUE IOHEXOL 350 MG/ML SOLN COMPARISON:  08/14/2019, 05/08/2018 FINDINGS: Cardiovascular: This is a technically adequate evaluation of the pulmonary vasculature. No filling defects or pulmonary emboli. The heart is unremarkable without pericardial  effusion. Normal caliber of the thoracic aorta. Mediastinum/Nodes: No enlarged mediastinal, hilar, or axillary lymph nodes. Thyroid gland, trachea, and esophagus demonstrate no significant findings. Lungs/Pleura: Mild background emphysema. No airspace disease, effusion, or pneumothorax. Central airways are patent. Upper Abdomen: There is diffuse hepatic steatosis. No acute upper abdominal findings. Musculoskeletal: No acute or destructive bony lesions. Reconstructed images demonstrate no additional findings. Review of the MIP images confirms the above findings. IMPRESSION: 1. No evidence of pulmonary embolus. 2. No acute intrathoracic process. 3. Diffuse hepatic steatosis. 4. Emphysema (ICD10-J43.9). Electronically Signed   By: Randa Ngo M.D.   On: 08/15/2019 00:28   US Abdomen Limited RUQ  Result Date: 08/14/2019 CLINICAL DATA:  Right upper quadrant abdominal pain. EXAM: ULTRASOUND ABDOMEN LIMITED RIGHT UPPER QUADRANT COMPARISON:  None. FINDINGS: Gallbladder: There is diffuse gallbladder wall thickening with the gallbladder wall measuring up to approximately 4 mm in thickness. There are no gallstones. The gallbladder is relatively under distended. The sonographic Percell Miller sign is negative. Common bile duct: Diameter: 10 mm Liver: The hepatic parenchyma is coarsened and heterogeneous in appearance. There is some mild intrahepatic biliary ductal dilatation. The liver surface appears somewhat nodular. Portal vein is patent on color Doppler imaging with normal direction of blood flow towards the liver. Other: None. IMPRESSION: 1. There is nonspecific gallbladder wall thickening in the absence of cholelithiasis. This can be seen in patients with ascites or underlying hepatocellular disease. 2. Dilated common bile duct with suggestion of intrahepatic ductal dilatation. Correlation with laboratory studies is recommended. 3. Coarsened and heterogeneous appearance of the liver raises concern for cirrhosis.  Electronically Signed   By: Constance Holster M.D.   On: 08/14/2019 21:23   CTA negative for acute PE.  Will admit for ongoing care.  Discussed with IM teaching service-- they will evaluate in the ED and admit.  Aware of plan for MRCP and GI eval in AM.   Larene Pickett, PA-C 08/15/19 0314    Mesner, Corene Cornea, MD 08/15/19 [phone]

## 2019-08-15 NOTE — Progress Notes (Addendum)
   Subjective:  Jacob Hill is a 49 y.o. with PMH of alcohol use disorder, alcoholic cirrhosis and esophageal varices admit for flank pain on hospital day 0  Jacob Hill was examined and evaluated at bedside this am. He was observed to be tremulous and appear uncomfortable. He mentions continuing to endorse significant back pain. Worsened by deep inspiration. Has not attempted to eat. Denies nausea. He mentions no significant improvement despite early morning dilaudid dose. States that his urine appears in color of 'gasoline.' He states he had prior kidney stones but this feels different as the pain is constant.  Objective:  Vital signs in last 24 hours: Vitals:   08/15/19 0221 08/15/19 0300 08/15/19 0510 08/15/19 0611  BP: (!) 143/93 (!) 150/83 (!) 160/83 (!) 163/90  Pulse: 88 82 85 64  Resp:  (!) 26 (!) 22 17  Temp:    98.7 F (37.1 C)  TempSrc:    Oral  SpO2:  95% 96% 100%  Weight:      Height:       Gen: Cachetic, chronically ill-appearing HEENT: NCAT head, temporal wasting, EOMI, no icteric sclerae CV: RRR, S1, S2 normal, No rubs, no murmurs, no gallops Pulm: CTAB, No rales, no wheezes Abd: Soft, BS+, non-distended, bs+, no ascites, + R side CVA tenderness Extm: ROM intact, Peripheral pulses intact Skin: Dry, Warm, poor turgor Neuro: AAOx3, Resting high-frequency, low-amplitude tremors  Assessment/Plan:  Principal Problem:   RUQ pain Active Problems:   Alcoholic cirrhosis of liver (HCC)   Essential hypertension, benign   GERD (gastroesophageal reflux disease)   Alcohol abuse   Thrombocytopenia (HCC)   Common bile duct dilation   Esophageal varices without bleeding (HCC)   Tobacco dependence   Seizure disorder [facility])  Jacob Hill is a 49 year old gentleman with medical history significant for alcohol use disorder, alcoholic cirrhosis, COPD, anxiety, seizure disorder, hypertension, GERD, esophageal varices (Grade 1) admit for flank pain due to possible  nephrolithiasis  R Flank pain Presented w/ 2-3 day duration pain without obvious inciting event. Initial suspicion for hepatic/cholecystic etiology due to elevated bilirubin. Work-up negative w/ RUQ [place] and MRCP. On exam, more consistent w/ renal colic. Bedside urinal with dark red urine concerning for hematuria. Currently requiring additional pain meds - Stat CT renal - Oxy 5-10mg  q4hr PRN - Dilaudid 1mg  for breakthrough - Zofran 4mg  q6hr PRN for nausea - Start tamsulsin 0.4mg  daily - Start LR 125cc/hr  Alcohol Use Disorder Alcoholic cirrhosis Last alcohol intake ~36 hrs prior. MRCP w/ evidence of early cirrhosis. Mentions long history of alcohol use disorder. Had previous DTs when he became sober for 2 years. Re-lapsed due to home situation. AST 146, ALT 82. Albumin 3.3 No INR this admission.  - CIWA - Librium taper - Thiamine/Folate - PT/INR, Trend CMP - C/w home meds: Keppra 500mg  BID  COPD Dyspneic on admission. CTA negative for PE. Currently on room air - Albuterol inhaler q6h PRN for wheezing - Anoro 1 puff daily   DVT prophx: SCDs Diet: low salt Bowel: N/A Code: Full  Prior to Admission Living Arrangement: Home Anticipated Discharge Location: Home Barriers to Discharge: Medical work-up Dispo: Anticipated discharge in approximately 0-1 day(s).   , MD 08/15/2019, 10:39 AM Pager: [phone] After 5pm on weekdays and 1pm on weekends: On Call Pager: [phone]

## 2019-08-15 NOTE — H&P (Signed)
Date: 08/15/2019               Patient Name:  ARNOL MCGIBBON MRN: [id number]  DOB: 23-Dec-1970 Age / Sex: 49 y.o., male   PCP: Cain Saupe, MD         Medical Service: Internal Medicine Teaching Service         Attending Physician: Dr. Inez Catalina, MD    First Contact: Mcarthur Rossetti, MD, Sadia Pager: SA [phone])  Second Contact: Nedra Hai, MD, Ivin Booty Pager: Eustaquio Maize [phone])       After Hours (After 5p/  First Contact Pager: [phone]  weekends / holidays): Second Contact Pager: [phone]   Chief Complaint: Right upper quadrant abdomen pain  History of Present Illness: Mr. Kovalenko is a 49 year old gentleman with medical history significant for alcohol use disorder, alcoholic cirrhosis, COPD, anxiety, seizure disorder, hypertension, GERD, esophageal varices (Grade 1) who presents to [facility] emergency department complaining of right-sided upper abdominal pain.  He states that he was in his usual state of health until several days (2-3) ago when he began experiencing right upper quadrant abdominal pain which he describes as "liver pain" with associated nonbilious nonbloody emesis and nausea x 1 yesterday. He does report that the pain radiate to his back and describes it as a sharp pain. The pain is exacerbated with palpation and movement. It is not related to any specific diet. He's noted some SOB intermittently that is mildly alleviated by albuterol inhaler use. He denies fevers, chills, chest pain, diarrhea, hematemesis, melena, hematochezia, new bruising or skin lesions, skin discoloration, jaundice or skin itching. He notes that since he arrived to [facility], he has been having hot and cold spells. Mr. Kross also endorses decreased appetite and unintentional weight loss over the past 1 month.   Mr. Vanderveen endorses a long time history of AUD with quitting in 2018 until several months ago. He quit again in March and April but began again in May of 2021. His last drink was a 12 ounce  beer on the AM of 6/26.   Of note, he initially presented to [facility] for which is CMP reviewed elevated liver enzymes, troponin was negative x2, CBC showed chronic thrombocytopenia, UA was unremarkable. His HEART score was 2 and was subsequently discharged from the ED. He states that he has also been evaluated at [facility] for his pain as well.   ED course:  On arrival to ED, patient was mildly hypertensive at 142/93, with HR of 84, O2 saturation of 95%. Initial lab work showed stable electrolytes with elevation in LFTS, total bilirubin and mild elevation in lipase at 109. CBC stable with chronic stable thrombocytopenia. Etoh level of 89. D-dimer of 2.14. CXR negative for acute changes; mild COPD. CTA chest negative for acute PE, RUQ ultrasound shows evidence of gallbladder wall thickening without cholelithiasis, dilated common bile duct and findings concerning for cirrhosis. GI consulted by EDP, and recommended MRCP.   Lab Orders     SARS Coronavirus 2 by RT PCR (hospital order, performed in [facility] hospital lab) Nasopharyngeal Nasopharyngeal Swab     CBC with Differential     Basic metabolic panel     Hepatic function panel     Lipase, blood     Ethanol     D-dimer, quantitative (not at [facility])     HIV Antibody (routine testing w rflx)     Comprehensive metabolic panel     CBC   Meds:  Current Meds  Medication Sig  . albuterol (VENTOLIN HFA) 108 (90 Base) MCG/ACT inhaler Inhale 2 puffs into the lungs every 6 (six) hours as needed for wheezing or shortness of breath.  . levETIRAcetam (KEPPRA) 500 MG tablet Take 1 tablet (500 mg total) by mouth 2 (two) times daily.  Marland Kitchen umeclidinium-vilanterol (ANORO ELLIPTA) 62.5-25 MCG/INH AEPB Inhale 1 puff into the lungs daily.   Allergies: Allergies as of 08/14/2019 - Review Complete 08/14/2019  Allergen Reaction Noted  . Codeine Nausea And Vomiting 12/23/2007  . Nsaids Nausea And Vomiting 12/23/2007   Past Medical  History:  Diagnosis Date  . Alcohol abuse   . Cirrhosis, alcoholic (HCC) 2013   seen with mild splenomegaly on CT 2013.  Hep ABC negative in 2013.   . Coagulopathy (HCC) 09/2011   elevated PT/INR  . COPD (chronic obstructive pulmonary disease) (HCC)   . DJD (degenerative joint disease)   . GERD (gastroesophageal reflux disease)   . Hypertension   . Marijuana abuse   . Neuropathy   . Opiate dependence (HCC) 2010   treated with Methadone in past.   . Osteoarthritis   . Seizures (HCC)   . Thrombocytopenia (HCC) 09/2013  . Tobacco abuse    Family History:  Family History  Problem Relation Age of Onset  . Diabetes Other   . Hypertension Other   . Diabetes Father    Social History:  Lives with fiance and her uncle.  History of alcohol use disorder; previously in remission with relapse in March of 2021  Denies recent drug use   Review of Systems: A complete ROS was negative except as per HPI.  Physical Exam: Blood pressure 139/83, pulse 76, temperature 98.6 F (37 C), temperature source Oral, resp. rate 10, height 6' (1.829 m), weight 65.8 kg, SpO2 91 %.  Physical Exam Vitals and nursing note reviewed.  Constitutional:      General: He is not in acute distress.    Appearance: He is cachectic.  HENT:     Head: Normocephalic and atraumatic.     Mouth/Throat:     Mouth: Mucous membranes are moist.     Pharynx: Oropharynx is clear.  Eyes:     General: No scleral icterus. Cardiovascular:     Rate and Rhythm: Normal rate and regular rhythm.     Heart sounds: No murmur heard.  No gallop.   Pulmonary:     Effort: Pulmonary effort is normal. No respiratory distress.     Breath sounds: No wheezing, rhonchi or rales.  Abdominal:     General: Abdomen is scaphoid. Bowel sounds are decreased.     Palpations: Abdomen is soft. There is no hepatomegaly or mass.     Tenderness: There is abdominal tenderness in the right upper quadrant. There is no right CVA tenderness or guarding.      Hernia: No hernia is present.  Musculoskeletal:       Back:     Right lower leg: No edema.     Left lower leg: No edema.  Skin:    General: Skin is warm and dry.     Coloration: Skin is not jaundiced or pale.  Neurological:     General: No focal deficit present.     Mental Status: He is alert and oriented to person, place, and time.     Motor: Tremor (diffuse body tremors on rest and with motion) present.     Comments: No asterixis.   Psychiatric:  Mood and Affect: Mood normal.        Behavior: Behavior normal. Behavior is cooperative.    EKG: personally reviewed my interpretation is: Sinus rhythm. No ischemic changes.   CXR: personally reviewed my interpretation is: No acute opacities. No pleural effusions.   Assessment & Plan by Problem: Active Problems:   Right flank pain  Mr. Molina is a 49 year old gentleman with medical history significant for alcohol use disorder, alcoholic cirrhosis, COPD, anxiety, seizure disorder, hypertension, GERD, esophageal varices (Grade 1) who is currently admitted for RUQ pain.   # RUQ abdominal pain  Pain for 2-3 days with gallbladder thickening with dilated common bile duct. LFTS elevated in comparison to March 2021. Differential includes choledocholithiasis versus malignancy with recent weight loss, although less likely given no mass on CT. Pancreatitis less likely given only mild elevation in lipase.   - GI consulted - MRCP ordered - Dilaudid 0.5 mg q6h PRN for severe pain - Zofran 4 mg q6h PRN for nausea  # AUD Elevated alcohol level on arrival with last alcohol intake approximately 24 hours ago.   - CIWA without ativan - Plan to start ativan if CIWA reaches >16  - Continue home Keppra 500 mg BID - Thiamine and folic acid   # COPD  - Albuterol inhaler q6h PRN for wheezing - Anoro 1 puff daily   FEN: Sodium restricted full diet, no IV fluids VTE ppx: SCDs  CODE STATUS: FULL  Prior to Admission Living Arrangement:  Home Anticipated Discharge Location: Home Barriers to Discharge: Continued medical evaluation  Dispo: Admit patient to Observation with expected length of stay less than 2 midnights.  Signed: Dr. Jose Persia Internal Medicine PGY-1  Pager: [phone] 08/15/2019, 3:02 AM  Internal Medicine Teaching Service After 5pm on weekdays and 1pm on weekends: On Call pager: [phone]

## 2019-08-16 DIAGNOSIS — F1020 Alcohol dependence, uncomplicated: Secondary | ICD-10-CM

## 2019-08-16 DIAGNOSIS — M7918 Myalgia, other site: Principal | ICD-10-CM

## 2019-08-16 LAB — CBC
HCT: 39.1 % (ref 39.0–52.0)
Hemoglobin: 13.6 g/dL (ref 13.0–17.0)
MCH: 34 pg (ref 26.0–34.0)
MCHC: 34.8 g/dL (ref 30.0–36.0)
MCV: 97.8 fL (ref 80.0–100.0)
Platelets: 75 10*3/uL — ABNORMAL LOW (ref 150–400)
RBC: 4 MIL/uL — ABNORMAL LOW (ref 4.22–5.81)
RDW: 14.2 % (ref 11.5–15.5)
WBC: 6.2 10*3/uL (ref 4.0–10.5)
nRBC: 0 % (ref 0.0–0.2)

## 2019-08-16 LAB — COMPREHENSIVE METABOLIC PANEL
ALT: 61 U/L — ABNORMAL HIGH (ref 0–44)
AST: 86 U/L — ABNORMAL HIGH (ref 15–41)
Albumin: 3 g/dL — ABNORMAL LOW (ref 3.5–5.0)
Alkaline Phosphatase: 42 U/L (ref 38–126)
Anion gap: 8 (ref 5–15)
BUN: 12 mg/dL (ref 6–20)
CO2: 25 mmol/L (ref 22–32)
Calcium: 9.1 mg/dL (ref 8.9–10.3)
Chloride: 104 mmol/L (ref 98–111)
Creatinine, Ser: 0.74 mg/dL (ref 0.61–1.24)
GFR calc Af Amer: >60 mL/min (ref >60)
GFR calc non Af Amer: >60 mL/min (ref >60)
Glucose, Bld: 78 mg/dL (ref 70–99)
Potassium: 4.3 mmol/L (ref 3.5–5.1)
Sodium: 137 mmol/L (ref 135–145)
Total Bilirubin: 3.1 mg/dL — ABNORMAL HIGH (ref 0.3–1.2)
Total Protein: 6.3 g/dL — ABNORMAL LOW (ref 6.5–8.1)

## 2019-08-16 LAB — PROTIME-INR
INR: 1.5 — ABNORMAL HIGH (ref 0.8–1.2)
Prothrombin Time: 17.5 seconds — ABNORMAL HIGH (ref 11.4–15.2)

## 2019-08-16 LAB — MAGNESIUM: Magnesium: 1.7 mg/dL (ref 1.7–2.4)

## 2019-08-16 MED ORDER — LIDOCAINE 5 % EX PTCH: 1.0000 | MEDICATED_PATCH | CUTANEOUS | 0 refills | Status: DC

## 2019-08-16 MED ORDER — BACLOFEN 5 MG HALF TABLET
5.0000 mg | ORAL_TABLET | Freq: Three times a day (TID) | ORAL | Status: DC
  Administered 2019-08-16: 5 mg via ORAL
  Filled 2019-08-16: qty 1

## 2019-08-16 MED ORDER — CHLORDIAZEPOXIDE HCL 25 MG PO CAPS: 25.0000 mg | ORAL_CAPSULE | ORAL | 0 refills | Status: AC

## 2019-08-16 MED ORDER — BACLOFEN 5 MG PO TABS: 5.0000 mg | ORAL_TABLET | Freq: Three times a day (TID) | ORAL | 0 refills | Status: DC

## 2019-08-16 MED ORDER — CHLORDIAZEPOXIDE HCL 25 MG PO CAPS: 25.0000 mg | ORAL_CAPSULE | Freq: Every day | ORAL | 0 refills | Status: AC

## 2019-08-16 MED ORDER — LIDOCAINE 5 % EX PTCH
1.0000 | MEDICATED_PATCH | CUTANEOUS | Status: DC
  Administered 2019-08-16: 1 via TRANSDERMAL
  Filled 2019-08-16: qty 1

## 2019-08-16 MED ORDER — CHLORDIAZEPOXIDE HCL 25 MG PO CAPS: 25.0000 mg | ORAL_CAPSULE | Freq: Three times a day (TID) | ORAL | 0 refills | Status: AC

## 2019-08-16 MED ORDER — MAGNESIUM SULFATE 2 GM/50ML IV SOLN
2.0000 g | Freq: Once | INTRAVENOUS | Status: AC
  Administered 2019-08-16: 2 g via INTRAVENOUS
  Filled 2019-08-16: qty 50

## 2019-08-16 MED FILL — CHLORDIAZEPOXIDE 25 MG CAP: 25 | 3 days supply | Qty: 6 | Fill #0

## 2019-08-16 MED FILL — BACLOFEN 10 MG TABS: 10 | 10 days supply | Qty: 15 | Fill #0

## 2019-08-16 NOTE — Discharge Instructions (Signed)
Flank Pain, Adult Flank pain is pain in your side. The flank is the area of your side between your upper belly (abdomen) and your back. The pain may occur over a short time (acute), or it may be long-term or come back often (chronic). It may be mild or very bad. Pain in this area can be caused by many different things. Follow these instructions at home:   Drink enough fluid to keep your pee (urine) clear or pale yellow.  Rest as told by your doctor.  Take over-the-counter and prescription medicines only as told by your doctor.  Keep a journal to keep track of: ? What has caused your flank pain. ? What has made it feel better.  Keep all follow-up visits as told by your doctor. This is important. Contact a doctor if:  Medicine does not help your pain.  You have new symptoms.  Your pain gets worse.  You have a fever.  Your symptoms last longer than 2-3 days.  You have trouble peeing.  You are peeing more often than normal. Get help right away if:  You have trouble breathing.  You are short of breath.  Your belly hurts, or it is swollen or red.  You feel sick to your stomach (nauseous).  You throw up (vomit).  You feel like you will pass out, or you do pass out (faint).  You have blood in your pee. Summary  Flank pain is pain in your side. The flank is the area of your side between your upper belly (abdomen) and your back.  Flank pain may occur over a short time (acute), or it may be long-term or come back often (chronic). It may be mild or very bad.  Pain in this area can be caused by many different things.  Contact your doctor if your symptoms get worse or they last longer than 2-3 days. This information is not intended to replace advice given to you by your health care provider. Make sure you discuss any questions you have with your health care provider. Document Revised: 01/17/2017 Document Reviewed: 05/27/2016 Elsevier Patient Education  2020 Elsevier  Inc.  

## 2019-08-16 NOTE — TOC CAGE-AID Note (Signed)
Transition of Care [facility]) - CAGE-AID Screening   Patient Details  Name: Jacob Hill MRN: [id number] Date of Birth: 1971-02-13  Transition of Care [facility]) CM/SW Contact:    Jimmy Picket, [place] Phone Number: 08/16/2019, 2:29 PM   Clinical Narrative: CSW spoke with pt bedside. Pt stated he has had a problem with opioids most of his like. Pt stated that he was on methadone until 2014 and he weaned himself off. Pt stated that when he got off methadone he started drinking alcohol. Pt stated that he doesn't drink as much alcohol as he used to but has picked opioids and cocaine back up.   Pt states he has been in treatment in the past and is interested in returning. CSW reviewed treatment resources with pt. Pt stated that he plans on calling [facility] about IOP meetings.      CAGE-AID Screening:    Have You Ever Felt You Ought to Cut Down on Your Drinking or Drug Use?: Yes Have People Annoyed You By Critizing Your Drinking Or Drug Use?: Yes Have You Felt Bad Or Guilty About Your Drinking Or Drug Use?: Yes Have You Ever Had a Drink or Used Drugs First Thing In The Morning to STeady Your Nerves or to Get Rid of a Hangover?: Yes CAGE-AID Score: 4  Substance Abuse Education Offered: Yes  Substance abuse interventions: Patient Counseling, Educational Materials  Jimmy Picket, Theresia Majors, [facility] Clinical Social Worker [phone]

## 2019-08-16 NOTE — Progress Notes (Signed)
Discharge summary provided to pt with instructions. Pt verbalized understanding of instructions. No complaints voiced. D/C as ordered. Pt alert/oriented ambulates without difficulty. All questions and concerns were fully answered. Per pt a friend of his will be responsible for his ride.

## 2019-08-16 NOTE — Plan of Care (Signed)

## 2019-08-16 NOTE — Progress Notes (Addendum)
NAME:  Jacob Hill, MRN:  [id number], DOB:  08/25/1970, LOS: 1 ADMISSION DATE:  08/14/2019   Brief History  49 yo male with AUD, alcohol induced cirhosis, COPD, HTN who presented to [facility] on 08/15/19 for evaluation of RUQ pain.  Subjective   Jacob Hill was examined and evaluated at bedside this am. He is continuing to endorse flank pain but otherwise feels well. Mentions having good urine output since admission. States that he was concerned about possible cancer. Also mentions concerns regarding continuing meds at home due to inability to afford his medications. Denies any fevers, chills, nausea, vomiting.  Objective   Blood pressure 120/82, pulse (!) 49, temperature 98.4 F (36.9 C), temperature source Oral, resp. rate 16, height 6' (1.829 m), weight 65.8 kg, SpO2 96 %.     Intake/Output Summary (Last 24 hours) at 08/16/2019 0540 Last data filed at 08/15/2019 0700 Gross per 24 hour  Intake --  Output 0 ml  Net 0 ml   Filed Weights   08/14/19 1946  Weight: 65.8 kg    Examination:  Gen: Well-developed, chronically ill-appearing, NAD HEENT: NCAT head, temporal wasting, hearing intact, MMM Neck: supple, ROM intact, no JVD CV: RRR, S1, S2 normal, No rubs, no murmurs, no gallops Pulm: CTAB, No rales, no wheezes Abd: Soft, BS+, Non-distended, tenderness to deep palpation Extm: ROM intact, Peripheral pulses intact, R paraspinal tenderness to palpation Skin: Dry, Warm, normal turgor Neuro: AAOx3, Mild high-frequency tremors improved from yesterday  Consults:   Significant Diagnostic Tests:  6/26 CXR> no acute findings 6/26 CTA chest>>no PE. 6/26 RUQ US>>nonspecific gallbladder wall thickening in the absence of cholelithiasis that can be seen in patients with hepatocellular disease or ascites. CBD dilation with suggestion of intrahepatic ductal dilation. Coarsened and heterogenous appearance of the liver raising concern for cirrhosis. 6/27 MRCP>>enlarged liver with  fibrotic changes. No intrahepatic biliary dilation. Extrahepatic biliary dilation without cholelithiasis or choledocholithiasis. Gastric and mid abdominal varicies consistent with portal hypertension. 6/27 CT renal study>>no acute fidings 6/27 renal US>>no abnormalities  Micro Data:  COVID NEG  Antimicrobials:  none  Labs    CBC Latest Ref Rng & Units 08/15/2019 08/14/2019 05/21/2019  WBC 4.0 - 10.5 K/uL 5.3 5.8 5.3  Hemoglobin 13.0 - 17.0 g/dL 13.8 14.5 15.4  Hematocrit 39 - 52 % 39.2 41.4 44.0  Platelets 150 - 400 K/uL 74(L) 77(L) 84(L)   BMP Latest Ref Rng & Units 08/15/2019 08/14/2019 05/21/2019  Glucose 70 - 99 mg/dL 84 88 136(H)  BUN 6 - 20 mg/dL <5(L) 5(L) 8  Creatinine 0.61 - 1.24 mg/dL 0.62 0.60(L) 0.74  BUN/Creat Ratio 9 - 20 - - -  Sodium 135 - 145 mmol/L 137 135 135  Potassium 3.5 - 5.1 mmol/L 3.6 3.6 3.6  Chloride 98 - 111 mmol/L 102 102 103  CO2 22 - 32 mmol/L 23 21(L) 20(L)  Calcium 8.9 - 10.3 mg/dL 9.0 9.5 8.8(L)    Summary  49 yo male with alcoholic liver cirrhosis who was admitted to [facility] on 08/15/19 for evaluation of RUQ pain. Etiology remains unclear at this time depsite significant amount of imaging including CXR, CTA chest, RUQ [place], MRCP, CT renal study and renal US being essentially unremarkable. At this point, would consider MSK to be most likely etiology. He is medically stable for discharge today and will have him follow up with PCP within a few days.  Assessment & Plan:  Principal Problem:   RUQ pain Active Problems:   Alcoholic cirrhosis of liver (  HCC)   Essential hypertension, benign   GERD (gastroesophageal reflux disease)   Alcohol abuse   Thrombocytopenia (HCC)   Common bile duct dilation   Esophageal varices without bleeding (HCC)   Tobacco dependence   Seizure disorder (HCC)   Right flank pain  R Flank pain Presented w/ acute onset. Initially thought to be biliary due to dilated duct on [place]. F/u work-up with MRCP negative. Renal work-up due to  CVA tenderness on exam negative for nephrolithiasis or renal colic. Presumed to be musculoskeletal. - Lidocaine patch - D/c home w/ baclofen  Alcohol use disorder Mentions willingness to d/c alcohol use at home. CIWA score improved 11->5 after Librium Plan: CIWA. Continue librium taper.   Alcoholic liver cirrhosis with gastric and abdominal varicies.  LFTs down-trending AST 168->146->86, ALT 89->82-61. INR 1.5. Albumin 3.0. Child-pugh class B w/ 7 points. Previously seen by Dr.Jacobs Thrombocytopenia Stable at 75. Due to cirrhosis Plan: F/u with outpatient GI  Intention tremor--most likely essential tremor. Worse with purposeful movements. Notes that tremor improves with ETOH use. Already on 60mg  propranolol and antiepileptic which are typically mainstays of treatment.  Plan: F/u w/ PCP  Seizure disorder. Continue keppra COPD. Continue anoro and albuterol.  Best practice:  CODE STATUS: FULL Diet: cardiac DVT for prophylaxis: SCDs Social considerations/Family communication: pt updated at bedside Dispo: discharge today  , MD 08/16/2019, 10:39 AM Pager: [phone] After 5pm on weekdays and 1pm on weekends: On Call Pager: [phone]

## 2019-08-16 NOTE — Discharge Summary (Signed)
Name: Jacob Hill MRN: [id number] DOB: September 23, 1970 49 y.o. PCP: Cain Saupe, MD  Date of Admission: 08/14/2019  7:43 PM Date of Discharge: 08/16/2019 Attending Physician: Carlynn Purl MD  Discharge Diagnosis: 1. Musculoskeletal Pain 2. Alcohol Use Disorder  Discharge Medications: Allergies as of 08/16/2019      Reactions   Codeine Nausea And Vomiting   Nsaids Nausea And Vomiting      Medication List    TAKE these medications   albuterol 108 (90 Base) MCG/ACT inhaler Commonly known as: VENTOLIN HFA Inhale 2 puffs into the lungs every 6 (six) hours as needed for wheezing or shortness of breath.   Anoro Ellipta 62.5-25 MCG/INH Aepb Generic drug: umeclidinium-vilanterol Inhale 1 puff into the lungs daily.   Baclofen 5 MG Tabs Take 5 mg by mouth 3 (three) times daily.   chlordiazePOXIDE 25 MG capsule Commonly known as: LIBRIUM Take 1 capsule (25 mg total) by mouth 3 (three) times daily for 1 day.   chlordiazePOXIDE 25 MG capsule Commonly known as: LIBRIUM Take 1 capsule (25 mg total) by mouth 2 (two) times daily in the am and at bedtime. for 1 day. Start taking on: August 17, 2019   chlordiazePOXIDE 25 MG capsule Commonly known as: LIBRIUM Take 1 capsule (25 mg total) by mouth daily for 1 day. Start taking on: August 18, 2019   folic acid 1 MG tablet Commonly known as: FOLVITE Take 1 tablet (1 mg total) by mouth daily.   levETIRAcetam 500 MG tablet Commonly known as: Keppra Take 1 tablet (500 mg total) by mouth 2 (two) times daily.   lidocaine 5 % Commonly known as: LIDODERM Place 1 patch onto the skin daily. Remove & Discard patch within 12 hours or as directed by MD   nicotine 7 mg/24hr patch Commonly known as: Nicoderm CQ Place 1 patch (7 mg total) onto the skin daily.   omeprazole 20 MG capsule Commonly known as: PriLOSEC Take 1 capsule (20 mg total) by mouth daily. To reduce stomach acid   propranolol ER 60 MG 24 hr capsule Commonly known as:  INDERAL LA Take 1 capsule (60 mg total) by mouth daily. For blood pressure and to lower heart rate   thiamine 100 MG tablet Commonly known as: Vitamin B-1 Take 1 tablet (100 mg total) by mouth daily.       Disposition and follow-up:   Jacob Hill was discharged from [facility] in [place] condition.  At the hospital follow up visit please address:  1. Musculoskeletal Pain - Ensure he takes his baclofen as prescribed - Please encourage adequate hydration  2. Alcohol Use Disorder - Ensure he finishes his librium taper as prescribed - Continue to encourage alcohol cessation  2.  Labs / imaging needed at time of follow-up: N/A  3.  Pending labs/ test needing follow-up: N/A  Follow-up Appointments:  Follow-up Information    Cain Saupe, MD. Call.   Specialty: Family Medicine Contact information: [address] [place] [address] [phone]              Hospital Course by problem list:  1. R Flank Pain: Jacob Hill is a 49 yo M w/ PMH of prior opioid use disorder, alcohol use disorder, and alcoholic cirrhosis presenting with elevated LFTs and pain of his right flank. Abdominal ultrasound was performed showing dilated biliary tract. Follow up MRCP was performed showing no acute findings. On hospital day 1, he was noted to have dark urine with radiation  of pain towards the back. Renal ultrasound and CT was performed showing no nephrolithiasis. He was presumed to have musculoskeletal pain and was discharged with baclofen.  2. Alcohol Use Disorder: Noted to have significant history of alcohol use disorder. Had some tremors on admission. Started on librium with improvement in tremors. Discharged with librium taper.  Discharge Vitals:   BP 110/72 (BP Location: Right Arm)   Pulse 71   Temp 98.2 F (36.8 C) (Oral)   Resp 18   Ht 6' (1.829 m)   Wt 65.8 kg   SpO2 97%   BMI 19.67 kg/m   Pertinent Labs, Studies, and Procedures:  CBC  Latest Ref Rng & Units 08/16/2019 08/15/2019 08/14/2019  WBC 4.0 - 10.5 K/uL 6.2 5.3 5.8  Hemoglobin 13.0 - 17.0 g/dL 13.6 13.8 14.5  Hematocrit 39 - 52 % 39.1 39.2 41.4  Platelets 150 - 400 K/uL 75(L) 74(L) 77(L)   CMP Latest Ref Rng & Units 08/16/2019 08/15/2019 08/14/2019  Glucose 70 - 99 mg/dL 78 84 88  BUN 6 - 20 mg/dL 12 <5(L) 5(L)  Creatinine 0.61 - 1.24 mg/dL 0.74 0.62 0.60(L)  Sodium 135 - 145 mmol/L 137 137 135  Potassium 3.5 - 5.1 mmol/L 4.3 3.6 3.6  Chloride 98 - 111 mmol/L 104 102 102  CO2 22 - 32 mmol/L 25 23 21(L)  Calcium 8.9 - 10.3 mg/dL 9.1 9.0 9.5  Total Protein 6.5 - 8.1 g/dL 6.3(L) 6.8 7.4  Total Bilirubin 0.3 - 1.2 mg/dL 3.1(H) 2.7(H) 2.3(H)  Alkaline Phos 38 - 126 U/L 42 46 46  AST 15 - 41 U/L 86(H) 146(H) 168(H)  ALT 0 - 44 U/L 61(H) 82(H) 89(H)   ULTRASOUND ABDOMEN LIMITED RIGHT UPPER QUADRANT FINDINGS: Gallbladder:  There is diffuse gallbladder wall thickening with the gallbladder wall measuring up to approximately 4 mm in thickness. There are no gallstones. The gallbladder is relatively under distended. The sonographic Percell Miller sign is negative.  Common bile duct:  Diameter: 10 mm  Liver:  The hepatic parenchyma is coarsened and heterogeneous in appearance. There is some mild intrahepatic biliary ductal dilatation. The liver surface appears somewhat nodular. Portal vein is patent on color Doppler imaging with normal direction of blood flow towards the liver.  Other: None.  IMPRESSION: 1. There is nonspecific gallbladder wall thickening in the absence of cholelithiasis. This can be seen in patients with ascites or underlying hepatocellular disease. 2. Dilated common bile duct with suggestion of intrahepatic ductal dilatation. Correlation with laboratory studies is recommended. 3. Coarsened and heterogeneous appearance of the liver raises concern for cirrhosis.  MRI ABDOMEN WITHOUT AND WITH CONTRAST (INCLUDING MRCP)  FINDINGS: Lower  chest: Negative  Hepatobiliary: There is diffuse enlargement of the liver. There is phase cancellation within the hepatic parenchyma consistent with steatosis. There is heterogeneous contrast enhancement of the liver. There is persistent enhancement on later phases consistent with the presence of fibrotic bands. No focal lesion.  Common bile duct measures 7 mm. There is no intrahepatic biliary dilatation. There is no cholelithiasis or choledocholithiasis.  Pancreas: Normal pancreatic contours and enhancement. No pancreatic ductal dilatation.  Spleen:  Normal  Adrenals/Urinary Tract:  Normal adrenal glands and kidneys.  Stomach/Bowel: Visualized bowel is normal.  Vascular/Lymphatic: Major abdominal arteries are patent. The portal vein, superior mesenteric vein and splenic vein are patent. There are gastric varices and midline lower abdominal varices.  Other:  No ascites  Musculoskeletal: No acute abnormality.  IMPRESSION: 1. Enlarged liver with fibrotic changes and lack of intrahepatic  biliary dilatation, consistent with early cirrhosis, likely alcoholic. 2. Extrahepatic biliary dilatation without choledocholithiasis or cholelithiasis. 3. Gastric and mid-abdominal varices, consistent with portal hypertension.  RENAL / URINARY TRACT ULTRASOUND COMPLETE  FINDINGS: Right Kidney:  Renal measurements: 12.3 x 5.7 x 5.6 cm = volume: 206 mL . Normal cortical thickness and echogenicity. No mass, hydronephrosis or shadowing calcification.  Left Kidney:  Renal measurements: 10.6 x 5.2 x 4.9 cm = volume: 142 mL. Normal cortical thickness and echogenicity. No mass, hydronephrosis or shadowing calcification.  Bladder:  Normal appearance  Other:  N/A  IMPRESSION: No renal sonographic abnormalities.  CT ABDOMEN AND PELVIS WITHOUT CONTRAST  FINDINGS: Lower chest: Lung bases clear  Hepatobiliary: Patchy fatty infiltration of liver. Dependent  density within gallbladder could represent sludge, dependent stones, or vicarious excretion of prior contrast; patient had CT angio chest performed earlier same date. No focal hepatic mass lesion. Minimal hepatic nodularity consistent with history of cirrhosis.  Pancreas: Normal appearance  Spleen: Normal appearance  Adrenals/Urinary Tract: Adrenal glands, kidneys, and ureters normal appearance. Small amount of excreted contrast within renal collecting systems and ureters. Bladder distended by contrast, grossly unremarkable.  Stomach/Bowel: Normal appendix. Stomach decompressed. Stomach and bowel loops otherwise normal appearance  Vascular/Lymphatic: Atherosclerotic calcifications aorta and iliac arteries without aneurysm. No adenopathy. Perigastric and perisplenic collaterals.  Reproductive: BILATERAL prostatic calcifications. Prostate gland upper normal size.  Other: No free air or free fluid.  Musculoskeletal: Degenerative disc disease changes lower lumbar spine.  IMPRESSION: Patchy fatty infiltration of liver with minimal hepatic nodularity consistent with history of cirrhosis.  Dependent density within gallbladder could represent sludge, dependent stones, or vicarious excretion of prior contrast; no definite gallstones were detected on prior MRCP.  No other intra-abdominal or intrapelvic abnormalities.  Aortic Atherosclerosis (ICD10-I70.0).  Discharge Instructions: Discharge Instructions    Call MD for:  difficulty breathing, headache or visual disturbances   Complete by: As directed    Call MD for:  persistant dizziness or light-headedness   Complete by: As directed    Call MD for:  persistant nausea and vomiting   Complete by: As directed    Call MD for:  redness, tenderness, or signs of infection (pain, swelling, redness, odor or green/yellow discharge around incision site)   Complete by: As directed    Call MD for:  temperature >100.4    Complete by: As directed    Diet - low sodium heart healthy   Complete by: As directed    Discharge instructions   Complete by: As directed    Dear Jacob Hill  You came to [place] with back pain. We have determined this was caused by muscle strain. Here are our recommendations for you at discharge:  Take baclofen 5mg  3 times daily as needed for muscle spasms Use lidocaine patch on area of pain Take chlordiazepoxide 25mg  3 times a day for one day, and then 2 times a day the next day and then 1 time. Please make sure to f/u with your primary care provider.  Thank you for choosing [facility].   Increase activity slowly   Complete by: As directed    Increase activity slowly   Complete by: As directed      Signed: , MD 08/16/2019, 6:45 PM Pager: [phone] After 5pm on weekdays and 1pm on weekends: On Call Pager: [phone]

## 2019-08-17 ENCOUNTER — Telehealth: Payer: Self-pay

## 2019-08-17 NOTE — Telephone Encounter (Signed)
Discharge call - attempt # 1 Discharged: 08/16/2019, [facility]  Call placed to patient # [phone], unable to leave a message as the voicemail is full., Call placed to  # [phone], the phone rang multiple times and then the call just dropped.   Patient needs to schedule follow up appointment with PCP.

## 2019-08-18 ENCOUNTER — Telehealth: Payer: Self-pay

## 2019-08-18 NOTE — Telephone Encounter (Signed)
Discharge call - attempt # 2 Discharged: 08/16/2019, [facility]  Call placed to patient # [phone], unable to leave a message as the voicemail is full., Call placed to  # [phone]. This number was for [employer]. This CM was informed that the patient does not work there.   Patient needs to schedule follow up appointment with PCP. Letter sent to patient requesting he call this office to schedule  Follow up.,

## 2019-09-15 ENCOUNTER — Ambulatory Visit: Payer: Self-pay | Admitting: Neurology

## 2020-01-22 ENCOUNTER — Other Ambulatory Visit: Payer: Self-pay

## 2020-01-22 ENCOUNTER — Emergency Department (HOSPITAL_COMMUNITY)
Admission: EM | Admit: 2020-01-22 | Discharge: 2020-01-22 | Disposition: A | Discharge status: Home / Self Care | Payer: 59 | Attending: Emergency Medicine | Admitting: Emergency Medicine

## 2020-01-22 ENCOUNTER — Emergency Department (HOSPITAL_COMMUNITY): Payer: 59

## 2020-01-22 DIAGNOSIS — F17210 Nicotine dependence, cigarettes, uncomplicated: Secondary | ICD-10-CM | POA: Insufficient documentation

## 2020-01-22 DIAGNOSIS — Z87442 Personal history of urinary calculi: Secondary | ICD-10-CM | POA: Insufficient documentation

## 2020-01-22 DIAGNOSIS — R110 Nausea: Secondary | ICD-10-CM | POA: Insufficient documentation

## 2020-01-22 DIAGNOSIS — F10129 Alcohol abuse with intoxication, unspecified: Secondary | ICD-10-CM | POA: Insufficient documentation

## 2020-01-22 DIAGNOSIS — R0789 Other chest pain: Secondary | ICD-10-CM | POA: Insufficient documentation

## 2020-01-22 DIAGNOSIS — R519 Headache, unspecified: Secondary | ICD-10-CM | POA: Insufficient documentation

## 2020-01-22 DIAGNOSIS — R109 Unspecified abdominal pain: Secondary | ICD-10-CM | POA: Insufficient documentation

## 2020-01-22 DIAGNOSIS — F10920 Alcohol use, unspecified with intoxication, uncomplicated: Secondary | ICD-10-CM

## 2020-01-22 DIAGNOSIS — J441 Chronic obstructive pulmonary disease with (acute) exacerbation: Secondary | ICD-10-CM | POA: Insufficient documentation

## 2020-01-22 DIAGNOSIS — R251 Tremor, unspecified: Secondary | ICD-10-CM | POA: Insufficient documentation

## 2020-01-22 DIAGNOSIS — K219 Gastro-esophageal reflux disease without esophagitis: Secondary | ICD-10-CM | POA: Insufficient documentation

## 2020-01-22 DIAGNOSIS — I10 Essential (primary) hypertension: Secondary | ICD-10-CM | POA: Insufficient documentation

## 2020-01-22 DIAGNOSIS — F419 Anxiety disorder, unspecified: Secondary | ICD-10-CM | POA: Insufficient documentation

## 2020-01-22 LAB — COMPREHENSIVE METABOLIC PANEL
ALT: 47 U/L — ABNORMAL HIGH (ref 0–44)
AST: 107 U/L — ABNORMAL HIGH (ref 15–41)
Albumin: 3.7 g/dL (ref 3.5–5.0)
Alkaline Phosphatase: 38 U/L (ref 38–126)
Anion gap: 13 (ref 5–15)
BUN: 5 mg/dL — ABNORMAL LOW (ref 6–20)
CO2: 20 mmol/L — ABNORMAL LOW (ref 22–32)
Calcium: 8.6 mg/dL — ABNORMAL LOW (ref 8.9–10.3)
Chloride: 102 mmol/L (ref 98–111)
Creatinine, Ser: 0.6 mg/dL — ABNORMAL LOW (ref 0.61–1.24)
GFR, Estimated: >60 mL/min (ref >60)
Glucose, Bld: 100 mg/dL — ABNORMAL HIGH (ref 70–99)
Potassium: 4.2 mmol/L (ref 3.5–5.1)
Sodium: 135 mmol/L (ref 135–145)
Total Bilirubin: 1.5 mg/dL — ABNORMAL HIGH (ref 0.3–1.2)
Total Protein: 8.1 g/dL (ref 6.5–8.1)

## 2020-01-22 LAB — CBC WITH DIFFERENTIAL/PLATELET
Abs Immature Granulocytes: 0.01 10*3/uL (ref 0.00–0.07)
Basophils Absolute: 0 10*3/uL (ref 0.0–0.1)
Basophils Relative: 1 %
Eosinophils Absolute: 0 10*3/uL (ref 0.0–0.5)
Eosinophils Relative: 0 %
HCT: 34.7 % — ABNORMAL LOW (ref 39.0–52.0)
Hemoglobin: 12.2 g/dL — ABNORMAL LOW (ref 13.0–17.0)
Immature Granulocytes: 0 %
Lymphocytes Relative: 16 %
Lymphs Abs: 0.9 10*3/uL (ref 0.7–4.0)
MCH: 34.1 pg — ABNORMAL HIGH (ref 26.0–34.0)
MCHC: 35.2 g/dL (ref 30.0–36.0)
MCV: 96.9 fL (ref 80.0–100.0)
Monocytes Absolute: 0.9 10*3/uL (ref 0.1–1.0)
Monocytes Relative: 15 %
Neutro Abs: 3.9 10*3/uL (ref 1.7–7.7)
Neutrophils Relative %: 68 %
Platelets: 80 10*3/uL — ABNORMAL LOW (ref 150–400)
RBC: 3.58 MIL/uL — ABNORMAL LOW (ref 4.22–5.81)
RDW: 14 % (ref 11.5–15.5)
WBC: 5.7 10*3/uL (ref 4.0–10.5)
nRBC: 0 % (ref 0.0–0.2)

## 2020-01-22 LAB — PROTIME-INR
INR: 1.7 — ABNORMAL HIGH (ref 0.8–1.2)
Prothrombin Time: 19.5 seconds — ABNORMAL HIGH (ref 11.4–15.2)

## 2020-01-22 LAB — CBG MONITORING, ED: Glucose-Capillary: 112 mg/dL — ABNORMAL HIGH (ref 70–99)

## 2020-01-22 LAB — TROPONIN I (HIGH SENSITIVITY)
Troponin I (High Sensitivity): 17 ng/L (ref <18)
Troponin I (High Sensitivity): 18 ng/L — ABNORMAL HIGH (ref <18)

## 2020-01-22 LAB — MAGNESIUM: Magnesium: 1.5 mg/dL — ABNORMAL LOW (ref 1.7–2.4)

## 2020-01-22 LAB — ETHANOL: Alcohol, Ethyl (B): 126 mg/dL — ABNORMAL HIGH (ref <10)

## 2020-01-22 MED ORDER — LEVETIRACETAM 500 MG PO TABS
500.0000 mg | ORAL_TABLET | Freq: Once | ORAL | Status: AC
  Administered 2020-01-22: 500 mg via ORAL
  Filled 2020-01-22: qty 1

## 2020-01-22 MED ORDER — MAGNESIUM SULFATE IN D5W 1-5 GM/100ML-% IV SOLN
1.0000 g | INTRAVENOUS | Status: AC
  Administered 2020-01-22: 1 g via INTRAVENOUS
  Filled 2020-01-22: qty 100

## 2020-01-22 MED ORDER — BACLOFEN 10 MG PO TABS
5.0000 mg | ORAL_TABLET | Freq: Once | ORAL | Status: AC
  Administered 2020-01-22: 5 mg via ORAL
  Filled 2020-01-22: qty 1

## 2020-01-22 NOTE — Discharge Instructions (Addendum)
Please drink alcohol only in moderation.  Take all of your medicine as previously prescribed, and follow-up with your physician.  Return here for concerning changes in your condition.

## 2020-01-22 NOTE — ED Provider Notes (Signed)
[facility]-EMERGENCY DEPT Provider Note   CSN: [id number] Arrival date & time: 01/22/20  1226     History No chief complaint on file.   Jacob Hill is a 49 y.o. male.  HPI Patient presents accompanied by a Corporate treasurer. Patient was placed in custody today. History is obtained by the patient, the officer, and EMS. Patient knowledges multiple lytic lesions with COPD, anxiety, seizures, alcohol abuse. He was seemingly in his usual state of health, though he notes that he was feeling poorly prior to being apprehended. Per EMS the patient only complained of symptoms after being placed into custody. He now complains of chest tightness, abdominal discomfort, nausea, headache, shakiness.  No clear relieving or exacerbating factors. Per EMS the patient's vital signs were essentially unremarkable in transport. Patient states that he stopped taking his medication sometime ago.  He continues to smoke cigarettes, however.     Past Medical History:  Diagnosis Date  . Alcohol abuse   . Cirrhosis, alcoholic (HCC) 2013   seen with mild splenomegaly on CT 2013.  Hep ABC negative in 2013.   . Coagulopathy (HCC) 09/2011   elevated PT/INR  . COPD (chronic obstructive pulmonary disease) (HCC)   . DJD (degenerative joint disease)   . GERD (gastroesophageal reflux disease)   . Hypertension   . Marijuana abuse   . Neuropathy   . Opiate dependence (HCC) 2010   treated with Methadone in past.   . Osteoarthritis   . Seizures (HCC)   . Thrombocytopenia (HCC) 09/2013  . Tobacco abuse     Patient Active Problem List   Diagnosis Date Noted  . Right flank pain 08/15/2019  . RUQ pain 08/15/2019  . Tobacco dependence 06/14/2018  . Polysubstance abuse (HCC) 05/08/2018  . COPD with acute exacerbation (HCC) 05/08/2018  . Portal hypertensive gastropathy (HCC)   . Esophageal varices without bleeding (HCC)   . Thrombocytopenia (HCC) 11/07/2015  . Gastric varices  11/07/2015  . GERD (gastroesophageal reflux disease)   . Alcohol abuse   . Common bile duct dilation   . Chronic RUQ pain   . Cocaine abuse (HCC)   . Generalized anxiety disorder 05/11/2014  . Pernicious anemia 11/03/2013  . Essential hypertension, benign 06/30/2012  . Alcoholic cirrhosis of liver (HCC) 02/05/2012  . History of renal calculi 11/21/2011  . Seizure disorder (HCC) 11/21/2011  . DJD (degenerative joint disease), ankle and foot 11/08/2010  . Idiopathic peripheral neuropathy 01/06/2009  . COPD exacerbation (HCC) 12/23/2007    Past Surgical History:  Procedure Laterality Date  . ESOPHAGOGASTRODUODENOSCOPY (EGD) WITH PROPOFOL N/A 11/08/2015   Procedure: ESOPHAGOGASTRODUODENOSCOPY (EGD) WITH PROPOFOL;  Surgeon: Rachael Fee, MD;  Location: [facility] ENDOSCOPY;  Service: Endoscopy;  Laterality: N/A;  . HEMORRHOID SURGERY         Family History  Problem Relation Age of Onset  . Diabetes Other   . Hypertension Other   . Diabetes Father     Social History   Tobacco Use  . Smoking status: Current Every Day Smoker    Packs/day: 0.50    Years: 15.00    Pack years: 7.50    Types: Cigarettes  . Smokeless tobacco: Never Used  Vaping Use  . Vaping Use: Never used  Substance Use Topics  . Alcohol use: Yes    Alcohol/week: 9.0 standard drinks    Types: 9 Cans of beer per week    Comment: No beer in 7 days  . Drug use: Yes  Types: Marijuana    Comment: Occas    Home Medications Prior to Admission medications   Medication Sig Start Date End Date Taking? Authorizing Provider  Baclofen 5 MG TABS Take 5 mg by mouth 3 (three) times daily. Patient not taking: Reported on 01/22/2020 08/16/19   Theotis BarrioLee, Joshua K, MD  folic acid (FOLVITE) 1 MG tablet Take 1 tablet (1 mg total) by mouth daily. Patient not taking: Reported on 01/22/2020 06/10/19   Storm FriskWright, Patrick E, MD  levETIRAcetam (KEPPRA) 500 MG tablet Take 1 tablet (500 mg total) by mouth 2 (two) times daily. Patient not  taking: Reported on 01/22/2020 06/10/19   Storm FriskWright, Patrick E, MD  lidocaine (LIDODERM) 5 % Place 1 patch onto the skin daily. Remove & Discard patch within 12 hours or as directed by MD Patient not taking: Reported on 01/22/2020 08/16/19   Theotis BarrioLee, Joshua K, MD  propranolol ER (INDERAL LA) 60 MG 24 hr capsule Take 1 capsule (60 mg total) by mouth daily. For blood pressure and to lower heart rate Patient not taking: Reported on 01/22/2020 06/10/19   Storm FriskWright, Patrick E, MD  thiamine (VITAMIN B-1) 100 MG tablet Take 1 tablet (100 mg total) by mouth daily. Patient not taking: Reported on 01/22/2020 06/10/19   Storm FriskWright, Patrick E, MD    Allergies    Codeine and Nsaids  Review of Systems   Review of Systems  Constitutional:       Per HPI, otherwise negative  HENT:       Per HPI, otherwise negative  Respiratory:       Per HPI, otherwise negative  Cardiovascular:       Per HPI, otherwise negative  Gastrointestinal: Positive for nausea. Negative for vomiting.  Endocrine:       Negative aside from HPI  Genitourinary:       Neg aside from HPI   Musculoskeletal:       Per HPI, otherwise negative  Skin: Negative.   Neurological: Positive for seizures. Negative for syncope.  Psychiatric/Behavioral: The patient is nervous/anxious.     Physical Exam Updated Vital Signs BP (!) 154/98   Pulse 97   Temp 98.2 F (36.8 C) (Oral)   Resp 16   SpO2 90%   Physical Exam Vitals and nursing note reviewed.  Constitutional:      General: He is not in acute distress.    Appearance: He is well-developed.     Comments: Unkempt adult male in handcuffs, wrist, ankle.  He is awake, alert, speaking clearly.  HENT:     Head: Normocephalic and atraumatic.  Eyes:     Conjunctiva/sclera: Conjunctivae normal.  Cardiovascular:     Rate and Rhythm: Normal rate and regular rhythm.  Pulmonary:     Effort: Pulmonary effort is normal. No respiratory distress.     Breath sounds: No stridor.  Abdominal:     General: There  is no distension.  Skin:    General: Skin is warm and dry.  Neurological:     Mental Status: He is alert and oriented to person, place, and time.  Psychiatric:     Comments: Anxious     ED Results / Procedures / Treatments   Labs (all labs ordered are listed, but only abnormal results are displayed) Labs Reviewed  CBC WITH DIFFERENTIAL/PLATELET - Abnormal; Notable for the following components:      Result Value   RBC 3.58 (*)    Hemoglobin 12.2 (*)    HCT 34.7 (*)  MCH 34.1 (*)    Platelets 80 (*)    All other components within normal limits  PROTIME-INR - Abnormal; Notable for the following components:   Prothrombin Time 19.5 (*)    INR 1.7 (*)    All other components within normal limits  ETHANOL - Abnormal; Notable for the following components:   Alcohol, Ethyl (B) 126 (*)    All other components within normal limits  COMPREHENSIVE METABOLIC PANEL - Abnormal; Notable for the following components:   CO2 20 (*)    Glucose, Bld 100 (*)    BUN 5 (*)    Creatinine, Ser 0.60 (*)    Calcium 8.6 (*)    AST 107 (*)    ALT 47 (*)    Total Bilirubin 1.5 (*)    All other components within normal limits  MAGNESIUM - Abnormal; Notable for the following components:   Magnesium 1.5 (*)    All other components within normal limits  CBG MONITORING, ED - Abnormal; Notable for the following components:   Glucose-Capillary 112 (*)    All other components within normal limits  TROPONIN I (HIGH SENSITIVITY) - Abnormal; Notable for the following components:   Troponin I (High Sensitivity) 18 (*)    All other components within normal limits  TROPONIN I (HIGH SENSITIVITY)    EKG None  Radiology DG Chest Portable 1 View  Result Date: 01/22/2020 CLINICAL DATA:  Shortness of breath, anxiety, chest pain and hematemesis. EXAM: PORTABLE CHEST 1 VIEW COMPARISON:  08/14/2019 FINDINGS: The heart size and mediastinal contours are within normal limits. Underlying chronic lung disease again  noted. There is some bibasilar atelectasis. There is no evidence of pulmonary edema, consolidation, pneumothorax or pleural fluid. The visualized skeletal structures are unremarkable. IMPRESSION: Bibasilar atelectasis and chronic lung disease. Electronically Signed   By: Irish Lack M.D.   On: 01/22/2020 14:29    Procedures Procedures (including critical care time)  Medications Ordered in ED Medications  magnesium sulfate IVPB 1 g 100 mL (has no administration in time range)  baclofen (LIORESAL) tablet 5 mg (5 mg Oral Given 01/22/20 1432)  levETIRAcetam (KEPPRA) tablet 500 mg (500 mg Oral Given 01/22/20 1432)    ED Course  I have reviewed the triage vital signs and the nursing notes.  Pertinent labs & imaging results that were available during my care of the patient were reviewed by me and considered in my medical decision making (see chart for details).    MDM Rules/Calculators/A&P                          3:54 PM Patient remains unremarkable, no seizure activity, no hemodynamic stability. Second troponin not available, delta of 1, both values less than 20, nonischemic EKG, all reassuring for low suspicion of ACS. Given his description of medication noncompliance, some suspicion for this, as well as alcohol use contributing to today's presentation. Patient found to have mild hypomagnesemia, which has been repleted, the patient will be appropriate following this for return to incarceration.  MDM Number of Diagnoses or Management Options Alcoholic intoxication without complication (HCC): new, needed workup Atypical chest pain: new, needed workup Hypomagnesemia: new, needed workup   Amount and/or Complexity of Data Reviewed Clinical lab tests: reviewed Tests in the radiology section of CPT: reviewed Tests in the medicine section of CPT: reviewed Decide to obtain previous medical records or to obtain history from someone other than the patient: yes Obtain history from someone  other than the patient: yes Review and summarize past medical records: yes Independent visualization of images, tracings, or specimens: yes  Risk of Complications, Morbidity, and/or Mortality Presenting problems: high Diagnostic procedures: high Management options: high  Critical Care Total time providing critical care: < 30 minutes  Patient Progress Patient progress: stable  Final Clinical Impression(s) / ED Diagnoses Final diagnoses:  Atypical chest pain  Hypomagnesemia  Alcoholic intoxication without complication (HCC)     Gerhard Munch, MD 01/22/20 1556

## 2020-01-22 NOTE — ED Triage Notes (Signed)
Per [facility] EMS pt from [place]. Called for shob, anxiety. Pt had just been arrested. O2 94/96 RA. Pt complaining of pain in abdominal and chest. EKG completed. N/V up blood per [place]. Bruising on forehead. Pt not wanting to speak.  CBG 126 T 100 160/90 H120 R 20 18 G LF AC HX COPD  Seizures, anxiety

## 2020-01-22 NOTE — ED Notes (Signed)
Pt in bed, respirations even and unlabored. Officer at bedside.

## 2020-01-22 NOTE — ED Notes (Signed)
Pt currently having episodes of vomiting.  Vital signs are unable to be obtained for this reason.

## 2020-01-22 NOTE — ED Notes (Signed)
Pt pulled IV out 

## 2020-04-21 ENCOUNTER — Telehealth: Payer: Self-pay | Admitting: Family Medicine

## 2020-04-21 NOTE — Telephone Encounter (Signed)
Called Pt no answer. Left vm that appt 3/8 will be virtual. Provider is out of the office but working virtual.

## 2020-04-25 ENCOUNTER — Ambulatory Visit: Payer: Self-pay | Attending: Nurse Practitioner | Admitting: Nurse Practitioner

## 2020-04-25 ENCOUNTER — Telehealth: Payer: Self-pay | Admitting: Nurse Practitioner

## 2020-04-25 ENCOUNTER — Other Ambulatory Visit: Payer: Self-pay

## 2020-04-25 NOTE — Telephone Encounter (Signed)
Call goes straight to voicemail. LVM to return call to office

## 2020-04-25 NOTE — Telephone Encounter (Signed)
CALLED. No answer. LVM to return call for televisit.

## 2020-09-20 ENCOUNTER — Ambulatory Visit (INDEPENDENT_AMBULATORY_CARE_PROVIDER_SITE_OTHER): Payer: Medicaid Other | Admitting: Primary Care

## 2020-12-10 IMAGING — MR MR ABDOMEN WO/W CM MRCP
19 of 23 series · 44 of 48 positions shown · IV contrast (gadavist)
Comparison: None.

CLINICAL DATA: Abdominal pain. Right upper quadrant pain. History
of hepatic cirrhosis.

EXAM:
MRI ABDOMEN WITHOUT AND WITH CONTRAST (INCLUDING MRCP)
TECHNIQUE: Multiplanar multisequence MR imaging of the abdomen was performed
both before and after the administration of intravenous contrast.
Heavily T2-weighted images of the biliary and pancreatic ducts were
obtained, and three-dimensional MRCP images were rendered by post
processing.
CONTRAST:  6mL GADAVIST GADOBUTROL 1 MMOL/ML IV SOLN

[Series 4: ax haste · axial · 6.0mm · 1.19mm/px · 1 of 34 slices shown]
[im 1/34]
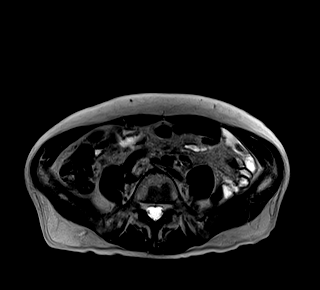

[Series 5: bSSFP · coronal · 6.0mm · 0.74mm/px · 1 of 34 slices shown]
[im 1/34]
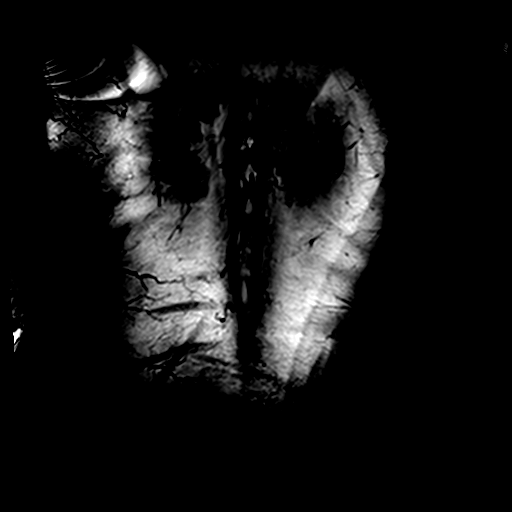

[Series 6: T2 fat-sat · axial · 6.0mm · 1.19mm/px · 1 of 34 slices shown]
[im 1/34]
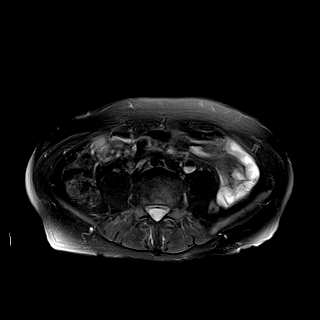

[Series 9: DWI · axial · 6.0mm · 1.42mm/px · z∈[-163,+74]mm · 2 of 102 slices shown (1 of 2)]
[im 1/102]
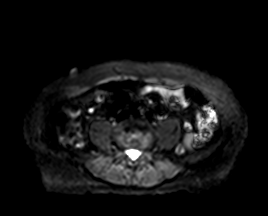
[im 102/102]
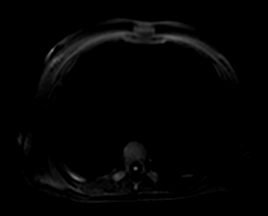

[Series 10: DWI · axial · 6.0mm · 1.42mm/px · 1 of 34 slices shown (2 of 2)]
[im 1/34]
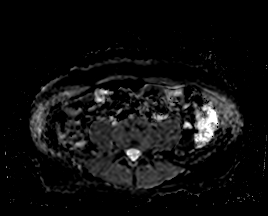

[Series 11: ax in and · axial · 3.0mm · 1.19mm/px · z∈[-166,+71]mm · 3 of 80 slices shown (1 of 2)]
[im 1/80]
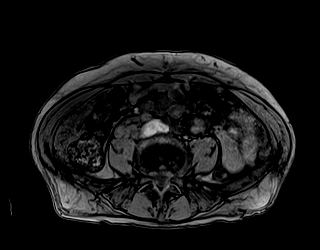
[im 40/80]
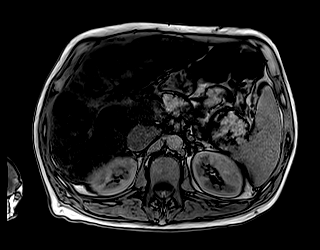
[im 80/80]
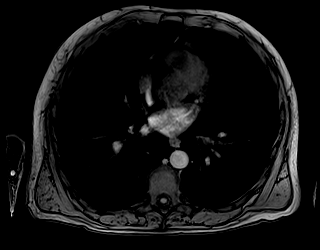

[Series 11: ax in and · axial · 3.0mm · 1.19mm/px · z∈[-166,+71]mm · 3 of 80 slices shown (2 of 2)]
[im 1/80]
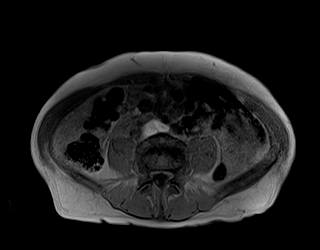
[im 40/80]
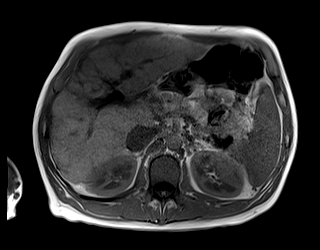
[im 80/80]
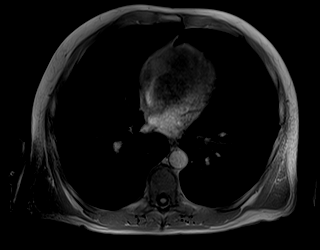

[Series 12: radials · coronal · 50.0mm · 0.78mm/px · 1 of 5 slices shown]
[im 1/5]
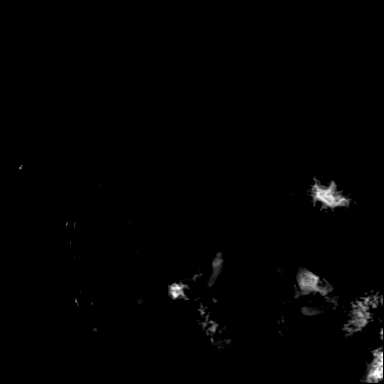

[Series 13: MRCP · coronal · 4.0mm · 1.12mm/px · 1 of 15 slices shown]
[im 1/15]
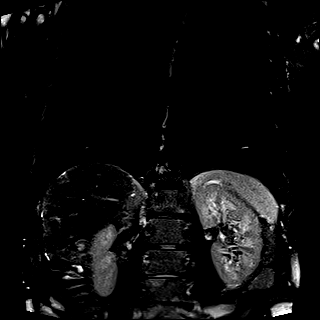

[Series 14: T1 dynamic · axial · non-contrast · 3.0mm · 1.19mm/px · z∈[-184,+77]mm · 3 of 88 slices shown (1 of 5)]
[im 1/88]
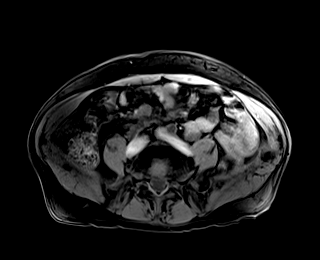
[im 44/88]
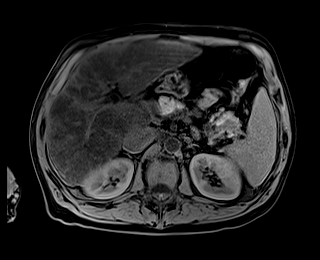
[im 88/88]
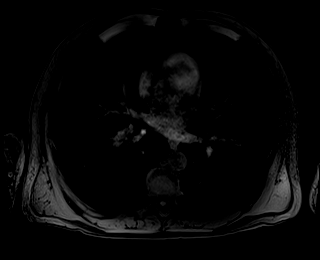

[Series 16: T1 dynamic post-contrast · axial · 3.0mm · 1.19mm/px · z∈[-184,+77]mm · 3 of 88 slices shown (1 of 5)]
[im 1/88]
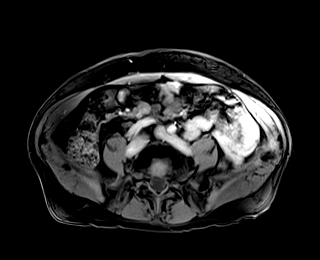
[im 44/88]
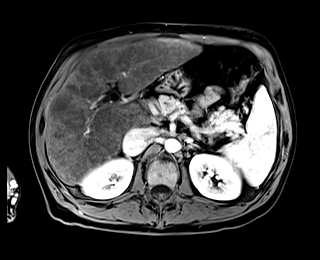
[im 88/88]
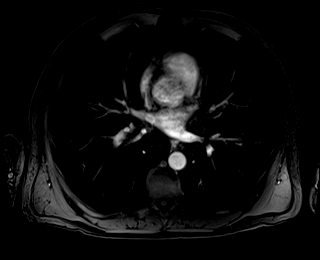

[Series 17: T1 dynamic · axial · 3.0mm · 1.19mm/px · z∈[-184,+77]mm · 3 of 88 slices shown (2 of 5)]
[im 1/88]
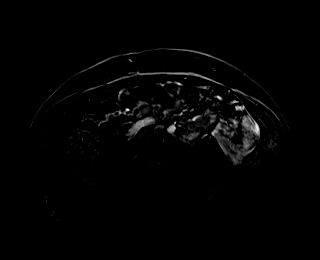
[im 44/88]
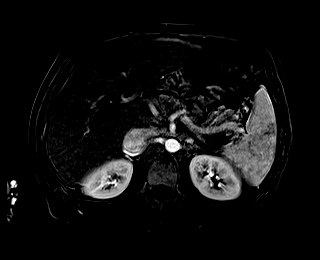
[im 88/88]
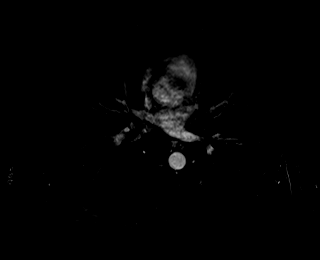

[Series 18: T1 dynamic post-contrast · axial · 3.0mm · 1.19mm/px · z∈[-184,+77]mm · 3 of 88 slices shown (2 of 5)]
[im 1/88]
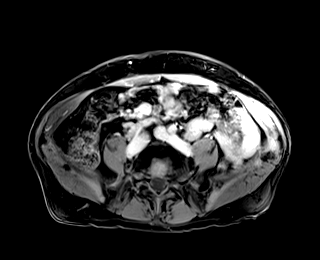
[im 44/88]
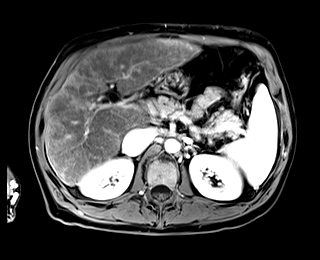
[im 88/88]
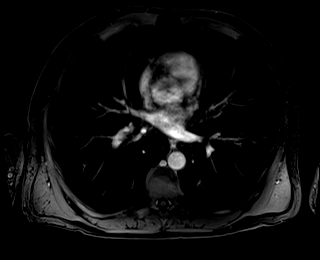

[Series 19: T1 dynamic · axial · 3.0mm · 1.19mm/px · z∈[-184,+77]mm · 3 of 88 slices shown (3 of 5)]
[im 1/88]
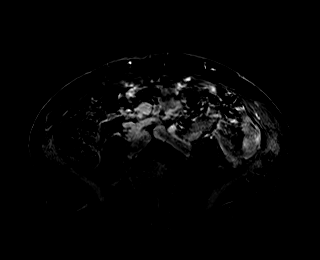
[im 44/88]
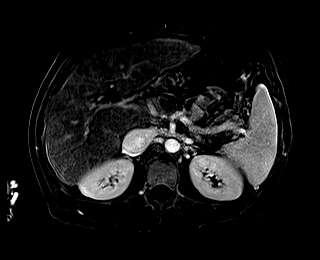
[im 88/88]
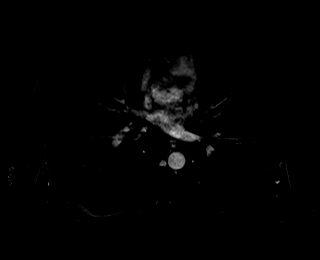

[Series 20: T1 dynamic post-contrast · axial · 3.0mm · 1.19mm/px · z∈[-184,+77]mm · 3 of 88 slices shown (3 of 5)]
[im 1/88]
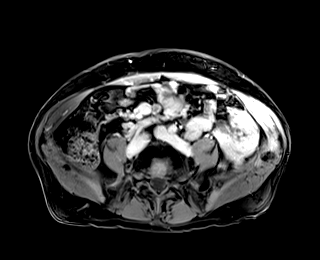
[im 44/88]
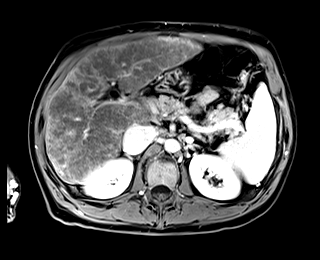
[im 88/88]
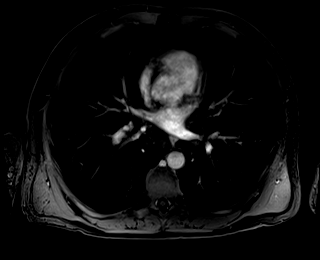

[Series 21: T1 dynamic · axial · 3.0mm · 1.19mm/px · z∈[-184,+77]mm · 3 of 88 slices shown (4 of 5)]
[im 1/88]
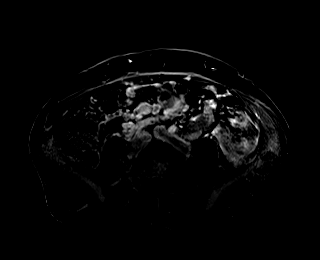
[im 44/88]
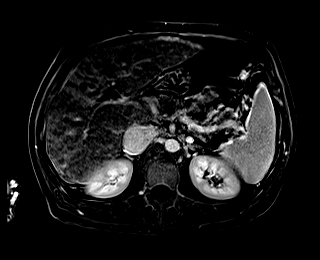
[im 88/88]
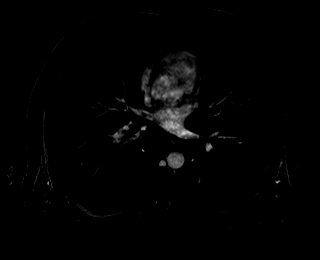

[Series 22: T1 dynamic post-contrast · axial · 3.0mm · 1.19mm/px · z∈[-184,+77]mm · 3 of 88 slices shown (4 of 5)]
[im 1/88]
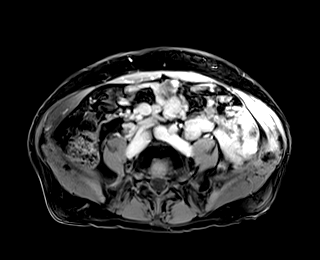
[im 44/88]
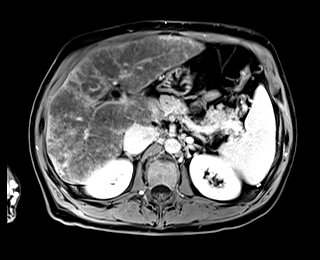
[im 88/88]
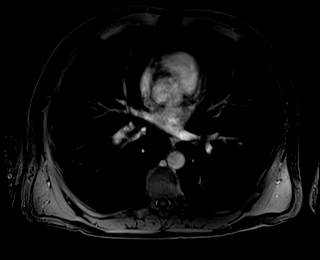

[Series 23: T1 dynamic · axial · 3.0mm · 1.19mm/px · z∈[-184,+77]mm · 3 of 88 slices shown (5 of 5)]
[im 1/88]
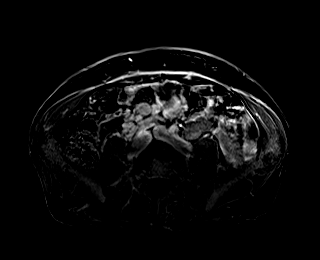
[im 44/88]
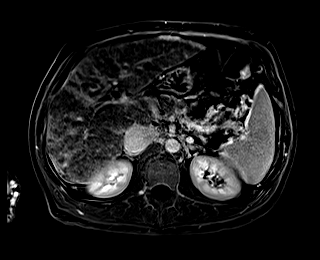
[im 88/88]
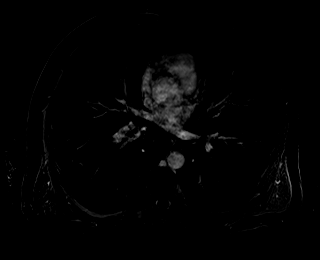

[Series 24: T1 dynamic post-contrast · coronal · 3.0mm · 1.31mm/px · 3 of 80 slices shown (5 of 5)]
[im 1/80]
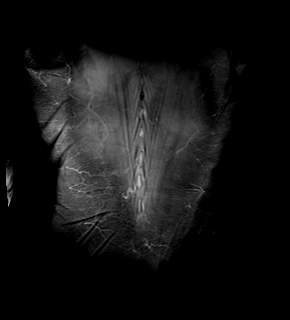
[im 40/80]
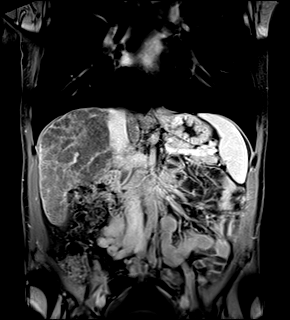
[im 80/80]
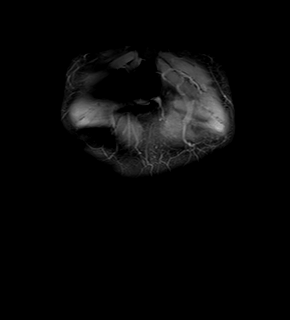

[44 of 48 positions shown; findings below may reference images not displayed]

FINDINGS: Lower chest: Negative

Hepatobiliary: There is diffuse enlargement of the liver. There is
phase cancellation within the hepatic parenchyma consistent with
steatosis. There is heterogeneous contrast enhancement of the liver.
There is persistent enhancement on later phases consistent with the
presence of fibrotic bands. No focal lesion.

Common bile duct measures 7 mm. There is no intrahepatic biliary
dilatation. There is no cholelithiasis or choledocholithiasis.

Pancreas: Normal pancreatic contours and enhancement. No pancreatic
ductal dilatation.

Spleen:  Normal

Adrenals/Urinary Tract:  Normal adrenal glands and kidneys.

Stomach/Bowel: Visualized bowel is normal.

Vascular/Lymphatic: Major abdominal arteries are patent. The portal
vein, superior mesenteric vein and splenic vein are patent. There
are gastric varices and midline lower abdominal varices.

Other:  No ascites

Musculoskeletal: No acute abnormality.
IMPRESSION: 1. Enlarged liver with fibrotic changes and lack of intrahepatic
biliary dilatation, consistent with early cirrhosis, likely
alcoholic.
2. Extrahepatic biliary dilatation without choledocholithiasis or
cholelithiasis.
3. Gastric and mid-abdominal varices, consistent with portal
hypertension.

## 2021-01-28 NOTE — Progress Notes (Signed)
Established Patient Office Visit  Subjective:  Patient ID: Jacob Hill, male    DOB: Jul 10, 1970  Age: 50 y.o. MRN: [id number]  CC:  Chief Complaint  Patient presents with   Hypertension   Shoulder Pain    HPI [name] presents for primary care visit to reestablish.  He is a former Dr. Chapman Fitch patient.  Patient has history of alcohol abuse alcoholic cirrhotic liver disease and alcohol induced neuropathy.  He has had opioid dependence in the past but is now off methadone.  Patient has a history of seizure disorder but is out of his seizure medications at this time.  Transportations been a barrier for him to get up to the clinic.  Patient continues to have some degree of anxiety at this time.  He is out of all of his medications.  On arrival blood pressure elevated 171/112.  Patient continues to smoke does have some wheezing and shortness of breath.  Complains of right shoulder pain and rib pain.  He does have insurance at this time.  He drinks 240 ounce bottles of beer daily.   Patient declined to receive any vaccinations at this visit.  The patient is due a colonoscopy will wanted to defer this at this time. Past Medical History:  Diagnosis Date   Alcohol abuse    Cirrhosis, alcoholic ([place]) 0000000   seen with mild splenomegaly on CT 2013.  Hep ABC negative in 2013.    Coagulopathy ([place]) 09/2011   elevated PT/INR   COPD (chronic obstructive pulmonary disease) (HCC)    DJD (degenerative joint disease)    GERD (gastroesophageal reflux disease)    Hypertension    Marijuana abuse    Neuropathy    Opiate dependence ([place]) 2010   treated with Methadone in past.    Osteoarthritis    Seizures ([place])    Thrombocytopenia ([place]) 09/2013   Tobacco abuse     Past Surgical History:  Procedure Laterality Date   ESOPHAGOGASTRODUODENOSCOPY (EGD) WITH PROPOFOL N/A 11/08/2015   Procedure: ESOPHAGOGASTRODUODENOSCOPY (EGD) WITH PROPOFOL;  Surgeon: Milus Banister, MD;  Location: [facility]  ENDOSCOPY;  Service: Endoscopy;  Laterality: N/A;   HEMORRHOID SURGERY      Family History  Problem Relation Age of Onset   Diabetes Other    Hypertension Other    Diabetes Father     Social History   Socioeconomic History   Marital status: Single    Spouse name: Not on file   Number of children: Not on file   Years of education: Not on file   Highest education level: Not on file  Occupational History   Not on file  Tobacco Use   Smoking status: Every Day    Packs/day: 0.50    Years: 15.00    Pack years: 7.50    Types: Cigarettes   Smokeless tobacco: Never  Vaping Use   Vaping Use: Never used  Substance and Sexual Activity   Alcohol use: Yes    Alcohol/week: 9.0 standard drinks    Types: 9 Cans of beer per week    Comment: No beer in 7 days   Drug use: Yes    Types: Marijuana    Comment: Occas   Sexual activity: Not Currently  Other Topics Concern   Not on file  Social History Narrative   Not on file   Social Determinants of Health   Financial Resource Strain: Not on file  Food Insecurity: Not on file  Transportation Needs: Not on file  Physical Activity: Not on file  Stress: Not on file  Social Connections: Not on file  Intimate Partner Violence: Not on file    Outpatient Medications Prior to Visit  Medication Sig Dispense Refill   Baclofen 5 MG TABS Take 5 mg by mouth 3 (three) times daily. (Patient not taking: Reported on 01/22/2020) 30 tablet 0   folic acid (FOLVITE) 1 MG tablet Take 1 tablet (1 mg total) by mouth daily. (Patient not taking: Reported on 01/22/2020) 30 tablet 3   levETIRAcetam (KEPPRA) 500 MG tablet Take 1 tablet (500 mg total) by mouth 2 (two) times daily. (Patient not taking: Reported on 01/22/2020) 60 tablet 1   lidocaine (LIDODERM) 5 % Place 1 patch onto the skin daily. Remove & Discard patch within 12 hours or as directed by MD (Patient not taking: Reported on 01/22/2020) 30 patch 0   propranolol ER (INDERAL LA) 60 MG 24 hr capsule  Take 1 capsule (60 mg total) by mouth daily. For blood pressure and to lower heart rate (Patient not taking: Reported on 01/22/2020) 30 capsule 4   thiamine (VITAMIN B-1) 100 MG tablet Take 1 tablet (100 mg total) by mouth daily. (Patient not taking: Reported on 01/22/2020) 30 tablet 3   No facility-administered medications prior to visit.    Allergies  Allergen Reactions   Codeine Nausea And Vomiting   Nsaids Nausea And Vomiting   Phenytoin Sodium Extended Nausea And Vomiting    ROS Review of Systems  Constitutional:  Negative for chills, diaphoresis and fever.  HENT:  Negative for congestion, hearing loss, nosebleeds, sore throat and tinnitus.   Eyes:  Negative for photophobia and redness.  Respiratory:  Positive for cough, shortness of breath and wheezing. Negative for stridor.   Cardiovascular:  Negative for chest pain, palpitations and leg swelling.  Gastrointestinal:  Negative for abdominal pain, blood in stool, constipation, diarrhea, nausea and vomiting.  Endocrine: Negative for polydipsia.  Genitourinary:  Negative for dysuria, flank pain, frequency, hematuria and urgency.  Musculoskeletal:  Negative for back pain, myalgias and neck pain.       Right shoulder pain  Skin:  Negative for rash.  Allergic/Immunologic: Negative for environmental allergies.  Neurological:  Negative for dizziness, tremors, seizures, weakness and headaches.  Hematological:  Does not bruise/bleed easily.  Psychiatric/Behavioral:  Positive for agitation and sleep disturbance. Negative for dysphoric mood, self-injury and suicidal ideas. The patient is nervous/anxious and is hyperactive.      Objective:    Physical Exam Vitals reviewed.  Constitutional:      Appearance: Normal appearance. He is well-developed. He is not diaphoretic.  HENT:     Head: Normocephalic and atraumatic.     Nose: No nasal deformity, septal deviation, mucosal edema or rhinorrhea.     Right Sinus: No maxillary sinus  tenderness or frontal sinus tenderness.     Left Sinus: No maxillary sinus tenderness or frontal sinus tenderness.     Mouth/Throat:     Pharynx: No oropharyngeal exudate.  Eyes:     General: No scleral icterus.    Conjunctiva/sclera: Conjunctivae normal.     Pupils: Pupils are equal, round, and reactive to light.  Neck:     Thyroid: No thyromegaly.     Vascular: No carotid bruit or JVD.     Trachea: Trachea normal. No tracheal tenderness or tracheal deviation.  Cardiovascular:     Rate and Rhythm: Normal rate and regular rhythm.     Chest Wall: PMI is not displaced.  Pulses: Normal pulses. No decreased pulses.     Heart sounds: Normal heart sounds, S1 normal and S2 normal. Heart sounds not distant. No murmur heard. No systolic murmur is present.  No diastolic murmur is present.    No friction rub. No gallop. No S3 or S4 sounds.  Pulmonary:     Effort: Pulmonary effort is normal. No tachypnea, accessory muscle usage or respiratory distress.     Breath sounds: No stridor. Wheezing present. No decreased breath sounds, rhonchi or rales.  Chest:     Chest wall: No tenderness.  Abdominal:     General: Bowel sounds are normal. There is no distension.     Palpations: Abdomen is soft. Abdomen is not rigid. There is no mass.     Tenderness: There is no abdominal tenderness. There is no right CVA tenderness, left CVA tenderness, guarding or rebound.     Hernia: No hernia is present.  Musculoskeletal:        General: Normal range of motion.     Cervical back: Normal range of motion and neck supple. No edema, erythema or rigidity. No muscular tenderness. Normal range of motion.  Lymphadenopathy:     Head:     Right side of head: No submental or submandibular adenopathy.     Left side of head: No submental or submandibular adenopathy.     Cervical: No cervical adenopathy.  Skin:    General: Skin is warm and dry.     Coloration: Skin is not pale.     Findings: No rash.     Nails: There  is no clubbing.  Neurological:     Mental Status: He is alert and oriented to person, place, and time.     Sensory: No sensory deficit.  Psychiatric:        Attention and Perception: Attention and perception normal.        Mood and Affect: Mood is anxious and depressed.        Speech: Speech normal.        Behavior: Behavior normal.        Thought Content: Thought content is not paranoid or delusional. Thought content does not include homicidal or suicidal ideation. Thought content does not include homicidal or suicidal plan.        Cognition and Memory: Cognition and memory normal.        Judgment: Judgment normal.    BP (!) 171/112   Pulse 99   Resp 16   Wt 182 lb (82.6 kg)   SpO2 95%   BMI 24.68 kg/m  Wt Readings from Last 3 Encounters:  01/29/21 182 lb (82.6 kg)  08/14/19 145 lb (65.8 kg)  05/21/19 180 lb (81.6 kg)     There are no preventive care reminders to display for this patient.   There are no preventive care reminders to display for this patient.  Lab Results  Component Value Date   TSH 1.42 09/13/2014   Lab Results  Component Value Date   WBC 5.7 01/22/2020   HGB 12.2 (L) 01/22/2020   HCT 34.7 (L) 01/22/2020   MCV 96.9 01/22/2020   PLT 80 (L) 01/22/2020   Lab Results  Component Value Date   NA 135 01/22/2020   K 4.2 01/22/2020   CO2 20 (L) 01/22/2020   GLUCOSE 100 (H) 01/22/2020   BUN 5 (L) 01/22/2020   CREATININE 0.60 (L) 01/22/2020   BILITOT 1.5 (H) 01/22/2020   ALKPHOS 38 01/22/2020   AST 107 (H)  01/22/2020   ALT 47 (H) 01/22/2020   PROT 8.1 01/22/2020   ALBUMIN 3.7 01/22/2020   CALCIUM 8.6 (L) 01/22/2020   ANIONGAP 13 01/22/2020   GFR 155.70 09/13/2014   Lab Results  Component Value Date   CHOL 242 (H) 11/03/2013   Lab Results  Component Value Date   HDL 35.50 (L) 11/03/2013   Lab Results  Component Value Date   LDLCALC 170 (H) 11/03/2013   Lab Results  Component Value Date   TRIG 185.0 (H) 11/03/2013   Lab Results   Component Value Date   CHOLHDL 7 11/03/2013   Lab Results  Component Value Date   HGBA1C 4.5 (L) 03/31/2012      Assessment & Plan:   Problem List Items Addressed This Visit       Cardiovascular and Mediastinum   Essential hypertension, benign - Primary    Blood pressure not controlled not currently on medications at this time  Begin Corgard to control portal hypertension and systemic hypertension 40 mg daily  Begin amlodipine 10 mg daily  Check metabolic panel      Relevant Medications   amLODipine (NORVASC) 10 MG tablet   nadolol (CORGARD) 40 MG tablet   Other Relevant Orders   Comprehensive metabolic panel   CBC with Differential/Platelet   Lipid panel   Gastric varices    As per esophageal assessment      Relevant Medications   amLODipine (NORVASC) 10 MG tablet   nadolol (CORGARD) 40 MG tablet   Esophageal varices without bleeding (HCC)    No evidence of current variceal bleeding observe for now      Relevant Medications   amLODipine (NORVASC) 10 MG tablet   nadolol (CORGARD) 40 MG tablet     Respiratory   COPD with chronic bronchitis (HCC)    Not currently exacerbating we will begin Symbicort 2 puffs twice daily as needed albuterol      Relevant Medications   budesonide-formoterol (SYMBICORT) 160-4.5 MCG/ACT inhaler   albuterol (VENTOLIN HFA) 108 (90 Base) MCG/ACT inhaler     Digestive   Alcoholic cirrhosis of liver (HCC)    Cirrhotic liver disease is been present for some time we will at some point need to get this patient back  into gastroenterology      Relevant Medications   folic acid (FOLVITE) 1 MG tablet   thiamine (VITAMIN B-1) 100 MG tablet   Other Relevant Orders   Comprehensive metabolic panel   CBC with Differential/Platelet   Vitamin B1   Folate   GERD (gastroesophageal reflux disease)    Continue proton pump inhibitor      Relevant Medications   folic acid (FOLVITE) 1 MG tablet   thiamine (VITAMIN B-1) 100 MG tablet    Portal hypertensive gastropathy (HCC)    Begin Corgard        Nervous and Auditory   Seizure disorder (HCC)    Renew Keppra      Relevant Medications   levETIRAcetam (KEPPRA) 500 MG tablet   gabapentin (NEURONTIN) 300 MG capsule     Hematopoietic and Hemostatic   Thrombocytopenia (HCC)    Check CBC        Other   Pernicious anemia    Check CBC and repeat folic acid levels      Relevant Medications   folic acid (FOLVITE) 1 MG tablet   Generalized anxiety disorder    Referral to clinical social work      Alcohol abuse    Referral  to clinical social work given references for AA      Polysubstance abuse (HCC)    States currently is not using      Tobacco dependence       Current smoking consumption amount: Pack a day  Dicsussion on advise to quit smoking and smoking impacts: Lung impacts  Patient's willingness to quit: Not ready to quit  Methods to quit smoking discussed: Behavioral modification nicotine replacement  Medication management of smoking session drugs discussed: Nicotine replacement  Resources provided:  AVS   Setting quit date not established  Follow-up arranged 2 months   Time spent counseling the patient: 5 minutes        Shoulder pain    Observe for now did not have enough time to fully assess       Meds ordered this encounter  Medications   DISCONTD: folic acid (FOLVITE) 1 MG tablet    Sig: Take 1 tablet (1 mg total) by mouth daily.    Dispense:  30 tablet    Refill:  3   DISCONTD: levETIRAcetam (KEPPRA) 500 MG tablet    Sig: Take 1 tablet (500 mg total) by mouth 2 (two) times daily.    Dispense:  60 tablet    Refill:  1   DISCONTD: thiamine (VITAMIN B-1) 100 MG tablet    Sig: Take 1 tablet (100 mg total) by mouth daily.    Dispense:  30 tablet    Refill:  3   DISCONTD: amLODipine (NORVASC) 10 MG tablet    Sig: Take 1 tablet (10 mg total) by mouth daily.    Dispense:  60 tablet    Refill:  3   DISCONTD: nadolol  (CORGARD) 40 MG tablet    Sig: Take 1 tablet (40 mg total) by mouth daily.    Dispense:  60 tablet    Refill:  4   amLODipine (NORVASC) 10 MG tablet    Sig: Take 1 tablet (10 mg total) by mouth daily.    Dispense:  60 tablet    Refill:  3   folic acid (FOLVITE) 1 MG tablet    Sig: Take 1 tablet (1 mg total) by mouth daily.    Dispense:  30 tablet    Refill:  3   levETIRAcetam (KEPPRA) 500 MG tablet    Sig: Take 1 tablet (500 mg total) by mouth 2 (two) times daily.    Dispense:  60 tablet    Refill:  1   nadolol (CORGARD) 40 MG tablet    Sig: Take 1 tablet (40 mg total) by mouth daily.    Dispense:  60 tablet    Refill:  4   thiamine (VITAMIN B-1) 100 MG tablet    Sig: Take 1 tablet (100 mg total) by mouth daily.    Dispense:  30 tablet    Refill:  3   budesonide-formoterol (SYMBICORT) 160-4.5 MCG/ACT inhaler    Sig: Inhale 2 puffs into the lungs 2 (two) times daily.    Dispense:  1 each    Refill:  12   albuterol (VENTOLIN HFA) 108 (90 Base) MCG/ACT inhaler    Sig: Inhale 2 puffs into the lungs every 6 (six) hours as needed for wheezing or shortness of breath.    Dispense:  18 g    Refill:  0    Can substitute   gabapentin (NEURONTIN) 300 MG capsule    Sig: Take 1 capsule (300 mg total) by mouth 3 (three) times daily.  Dispense:  90 capsule    Refill:  3    Follow-up: Return in about 3 months (around 04/29/2021).    Asencion Noble, MD

## 2021-01-29 ENCOUNTER — Other Ambulatory Visit: Payer: Self-pay

## 2021-01-29 ENCOUNTER — Encounter: Payer: Self-pay | Admitting: Critical Care Medicine

## 2021-01-29 ENCOUNTER — Telehealth: Payer: Self-pay | Admitting: Critical Care Medicine

## 2021-01-29 ENCOUNTER — Ambulatory Visit: Payer: Self-pay | Attending: Critical Care Medicine | Admitting: Critical Care Medicine

## 2021-01-29 VITALS — BP 171/112 | HR 99 | Resp 16 | Wt 182.0 lb

## 2021-01-29 DIAGNOSIS — D510 Vitamin B12 deficiency anemia due to intrinsic factor deficiency: Secondary | ICD-10-CM

## 2021-01-29 DIAGNOSIS — M25519 Pain in unspecified shoulder: Secondary | ICD-10-CM | POA: Insufficient documentation

## 2021-01-29 DIAGNOSIS — Z7951 Long term (current) use of inhaled steroids: Secondary | ICD-10-CM | POA: Insufficient documentation

## 2021-01-29 DIAGNOSIS — J4489 Other specified chronic obstructive pulmonary disease: Secondary | ICD-10-CM

## 2021-01-29 DIAGNOSIS — F17210 Nicotine dependence, cigarettes, uncomplicated: Secondary | ICD-10-CM | POA: Insufficient documentation

## 2021-01-29 DIAGNOSIS — M25511 Pain in right shoulder: Secondary | ICD-10-CM | POA: Insufficient documentation

## 2021-01-29 DIAGNOSIS — Z79899 Other long term (current) drug therapy: Secondary | ICD-10-CM | POA: Insufficient documentation

## 2021-01-29 DIAGNOSIS — I864 Gastric varices: Secondary | ICD-10-CM

## 2021-01-29 DIAGNOSIS — J449 Chronic obstructive pulmonary disease, unspecified: Secondary | ICD-10-CM | POA: Insufficient documentation

## 2021-01-29 DIAGNOSIS — F1010 Alcohol abuse, uncomplicated: Secondary | ICD-10-CM

## 2021-01-29 DIAGNOSIS — F1910 Other psychoactive substance abuse, uncomplicated: Secondary | ICD-10-CM

## 2021-01-29 DIAGNOSIS — F411 Generalized anxiety disorder: Secondary | ICD-10-CM

## 2021-01-29 DIAGNOSIS — K7030 Alcoholic cirrhosis of liver without ascites: Secondary | ICD-10-CM

## 2021-01-29 DIAGNOSIS — K766 Portal hypertension: Secondary | ICD-10-CM | POA: Insufficient documentation

## 2021-01-29 DIAGNOSIS — G40909 Epilepsy, unspecified, not intractable, without status epilepticus: Secondary | ICD-10-CM

## 2021-01-29 DIAGNOSIS — K3189 Other diseases of stomach and duodenum: Secondary | ICD-10-CM

## 2021-01-29 DIAGNOSIS — D696 Thrombocytopenia, unspecified: Secondary | ICD-10-CM

## 2021-01-29 DIAGNOSIS — I10 Essential (primary) hypertension: Secondary | ICD-10-CM

## 2021-01-29 DIAGNOSIS — I8510 Secondary esophageal varices without bleeding: Secondary | ICD-10-CM

## 2021-01-29 DIAGNOSIS — F17200 Nicotine dependence, unspecified, uncomplicated: Secondary | ICD-10-CM

## 2021-01-29 DIAGNOSIS — G8929 Other chronic pain: Secondary | ICD-10-CM

## 2021-01-29 DIAGNOSIS — K219 Gastro-esophageal reflux disease without esophagitis: Secondary | ICD-10-CM

## 2021-01-29 DIAGNOSIS — Z7901 Long term (current) use of anticoagulants: Secondary | ICD-10-CM | POA: Insufficient documentation

## 2021-01-29 DIAGNOSIS — G621 Alcoholic polyneuropathy: Secondary | ICD-10-CM | POA: Insufficient documentation

## 2021-01-29 MED ORDER — FOLIC ACID 1 MG PO TABS: 1.0000 mg | ORAL_TABLET | Freq: Every day | ORAL | 3 refills | Status: DC

## 2021-01-29 MED ORDER — VITAMIN B-1 100 MG PO TABS
100.0000 mg | ORAL_TABLET | Freq: Every day | ORAL | 3 refills | Status: DC
  Filled 2021-01-29: qty 30, 30d supply, fill #0

## 2021-01-29 MED ORDER — NADOLOL 40 MG PO TABS: 40.0000 mg | ORAL_TABLET | Freq: Every day | ORAL | 4 refills | Status: DC

## 2021-01-29 MED ORDER — BUDESONIDE-FORMOTEROL FUMARATE 160-4.5 MCG/ACT IN AERO: 2.0000 | INHALATION_SPRAY | Freq: Two times a day (BID) | RESPIRATORY_TRACT | 12 refills | Status: DC

## 2021-01-29 MED ORDER — AMLODIPINE BESYLATE 10 MG PO TABS: 10.0000 mg | ORAL_TABLET | Freq: Every day | ORAL | 3 refills | Status: DC

## 2021-01-29 MED ORDER — LEVETIRACETAM 500 MG PO TABS
500.0000 mg | ORAL_TABLET | Freq: Two times a day (BID) | ORAL | 1 refills | Status: DC
Start: 2021-01-29 — End: 2021-01-29
  Filled 2021-01-29: qty 60, 30d supply, fill #0

## 2021-01-29 MED ORDER — LEVETIRACETAM 500 MG PO TABS: 500.0000 mg | ORAL_TABLET | Freq: Two times a day (BID) | ORAL | 1 refills | Status: DC

## 2021-01-29 MED ORDER — AMLODIPINE BESYLATE 10 MG PO TABS
10.0000 mg | ORAL_TABLET | Freq: Every day | ORAL | 3 refills | Status: DC
Start: 2021-01-29 — End: 2021-01-29
  Filled 2021-01-29: qty 30, 30d supply, fill #0

## 2021-01-29 MED ORDER — NADOLOL 40 MG PO TABS
40.0000 mg | ORAL_TABLET | Freq: Every day | ORAL | 4 refills | Status: DC
Start: 2021-01-29 — End: 2021-01-29
  Filled 2021-01-29: qty 30, 30d supply, fill #0

## 2021-01-29 MED ORDER — ALBUTEROL SULFATE HFA 108 (90 BASE) MCG/ACT IN AERS: 2.0000 | INHALATION_SPRAY | Freq: Four times a day (QID) | RESPIRATORY_TRACT | 0 refills | Status: DC | PRN

## 2021-01-29 MED ORDER — VITAMIN B-1 100 MG PO TABS
100.0000 mg | ORAL_TABLET | Freq: Every day | ORAL | 3 refills | Status: DC
Start: 2021-01-29 — End: 2021-02-01

## 2021-01-29 MED ORDER — FOLIC ACID 1 MG PO TABS
1.0000 mg | ORAL_TABLET | Freq: Every day | ORAL | 3 refills | Status: DC
  Filled 2021-01-29: qty 30, 30d supply, fill #0

## 2021-01-29 MED ORDER — GABAPENTIN 300 MG PO CAPS: 300.0000 mg | ORAL_CAPSULE | Freq: Three times a day (TID) | ORAL | 3 refills | Status: DC

## 2021-01-29 NOTE — Assessment & Plan Note (Signed)
Observe for now did not have enough time to fully assess

## 2021-01-29 NOTE — Assessment & Plan Note (Signed)
  .   Current smoking consumption amount: Pack a day  . Dicsussion on advise to quit smoking and smoking impacts: Lung impacts  . Patient's willingness to quit: Not ready to quit  . Methods to quit smoking discussed: Behavioral modification nicotine replacement  . Medication management of smoking session drugs discussed: Nicotine replacement  . Resources provided:  AVS   . Setting quit date not established  . Follow-up arranged 2 months   Time spent counseling the patient: 5 minutes

## 2021-01-29 NOTE — Assessment & Plan Note (Signed)
No evidence of current variceal bleeding observe for now

## 2021-01-29 NOTE — Assessment & Plan Note (Signed)
As per esophageal assessment

## 2021-01-29 NOTE — Assessment & Plan Note (Signed)
Blood pressure not controlled not currently on medications at this time  Begin Corgard to control portal hypertension and systemic hypertension 40 mg daily  Begin amlodipine 10 mg daily  Check metabolic panel

## 2021-01-29 NOTE — Assessment & Plan Note (Signed)
Check CBC 

## 2021-01-29 NOTE — Assessment & Plan Note (Signed)
Continue proton pump inhibitor

## 2021-01-29 NOTE — Assessment & Plan Note (Addendum)
Begin Corgard

## 2021-01-29 NOTE — Assessment & Plan Note (Signed)
Renew Keppra.

## 2021-01-29 NOTE — Telephone Encounter (Signed)
Ongoing alcohol abuse in the setting of alcoholic cirrhosis severe generalized anxiety please assess  I gave AA resources please give other outpatient resources for alcohol counseling

## 2021-01-29 NOTE — Assessment & Plan Note (Signed)
Not currently exacerbating we will begin Symbicort 2 puffs twice daily as needed albuterol

## 2021-01-29 NOTE — Assessment & Plan Note (Signed)
Referral to clinical social work 

## 2021-01-29 NOTE — Assessment & Plan Note (Addendum)
Cirrhotic liver disease is been present for some time we will at some point need to get this patient back  into gastroenterology

## 2021-01-29 NOTE — Assessment & Plan Note (Signed)
States currently is not using

## 2021-01-29 NOTE — Assessment & Plan Note (Signed)
Check CBC and repeat folic acid levels

## 2021-01-29 NOTE — Patient Instructions (Signed)
Begin Corgard 1 pill daily for blood pressure Begin amlodipine 1 pill daily for blood pressure Resume thiamine and folic acid daily  Resume Keppra twice daily for seizure prevention  Complete set of screening labs will be obtained today  You declined the flu shot and pneumonia shot  Focus on cessation of alcohol you may need to consider some type of support service   [email or web address] (website) or [phone] is the information for alcoholics anonymous  Both are free and immediately available for help with alcohol and drug use   Our licensed clinical social worker will contact you for counseling  Return to see Dr. Delford Field 3 months

## 2021-01-29 NOTE — Assessment & Plan Note (Signed)
Referral to clinical social work given references for AA

## 2021-02-01 ENCOUNTER — Telehealth: Payer: Self-pay

## 2021-02-01 ENCOUNTER — Other Ambulatory Visit: Payer: Self-pay

## 2021-02-01 ENCOUNTER — Telehealth: Payer: Self-pay | Admitting: Critical Care Medicine

## 2021-02-01 DIAGNOSIS — K219 Gastro-esophageal reflux disease without esophagitis: Secondary | ICD-10-CM

## 2021-02-01 DIAGNOSIS — K7030 Alcoholic cirrhosis of liver without ascites: Secondary | ICD-10-CM

## 2021-02-01 LAB — CBC WITH DIFFERENTIAL/PLATELET
Basophils Absolute: 0.1 10*3/uL (ref 0.0–0.2)
Basos: 1 %
EOS (ABSOLUTE): 0.3 10*3/uL (ref 0.0–0.4)
Eos: 5 %
Hematocrit: 43.1 % (ref 37.5–51.0)
Hemoglobin: 15.7 g/dL (ref 13.0–17.7)
Immature Grans (Abs): 0 10*3/uL (ref 0.0–0.1)
Immature Granulocytes: 0 %
Lymphocytes Absolute: 1.6 10*3/uL (ref 0.7–3.1)
Lymphs: 32 %
MCH: 33.8 pg — ABNORMAL HIGH (ref 26.6–33.0)
MCHC: 36.4 g/dL — ABNORMAL HIGH (ref 31.5–35.7)
MCV: 93 fL (ref 79–97)
Monocytes Absolute: 1.1 10*3/uL — ABNORMAL HIGH (ref 0.1–0.9)
Monocytes: 21 %
Neutrophils Absolute: 2.1 10*3/uL (ref 1.4–7.0)
Neutrophils: 41 %
Platelets: 140 10*3/uL — ABNORMAL LOW (ref 150–450)
RBC: 4.65 x10E6/uL (ref 4.14–5.80)
RDW: 12.2 % (ref 11.6–15.4)
WBC: 5.2 10*3/uL (ref 3.4–10.8)

## 2021-02-01 LAB — COMPREHENSIVE METABOLIC PANEL
ALT: 23 IU/L (ref 0–44)
AST: 42 IU/L — ABNORMAL HIGH (ref 0–40)
Albumin/Globulin Ratio: 1 — ABNORMAL LOW (ref 1.2–2.2)
Albumin: 3.9 g/dL — ABNORMAL LOW (ref 4.0–5.0)
Alkaline Phosphatase: 55 IU/L (ref 44–121)
BUN/Creatinine Ratio: 12 (ref 9–20)
BUN: 8 mg/dL (ref 6–24)
Bilirubin Total: 0.9 mg/dL (ref 0.0–1.2)
CO2: 21 mmol/L (ref 20–29)
Calcium: 9.6 mg/dL (ref 8.7–10.2)
Chloride: 107 mmol/L — ABNORMAL HIGH (ref 96–106)
Creatinine, Ser: 0.65 mg/dL — ABNORMAL LOW (ref 0.76–1.27)
Globulin, Total: 3.9 g/dL (ref 1.5–4.5)
Glucose: 79 mg/dL (ref 70–99)
Potassium: 4.4 mmol/L (ref 3.5–5.2)
Sodium: 143 mmol/L (ref 134–144)
Total Protein: 7.8 g/dL (ref 6.0–8.5)
eGFR: 115 mL/min/{1.73_m2} (ref >59)

## 2021-02-01 LAB — VITAMIN B1: Thiamine: 126.8 nmol/L (ref 66.5–200.0)

## 2021-02-01 LAB — LIPID PANEL
Chol/HDL Ratio: 3 ratio (ref 0.0–5.0)
Cholesterol, Total: 154 mg/dL (ref 100–199)
HDL: 52 mg/dL (ref >39)
LDL Chol Calc (NIH): 92 mg/dL (ref 0–99)
Triglycerides: 47 mg/dL (ref 0–149)
VLDL Cholesterol Cal: 10 mg/dL (ref 5–40)

## 2021-02-01 LAB — FOLATE: Folate: 11 ng/mL (ref >3.0)

## 2021-02-01 MED ORDER — FOLIC ACID 1 MG PO TABS
1.0000 mg | ORAL_TABLET | Freq: Every day | ORAL | 3 refills | Status: DC
  Filled 2021-02-01: qty 30, 30d supply, fill #0

## 2021-02-01 MED ORDER — GABAPENTIN 300 MG PO CAPS
300.0000 mg | ORAL_CAPSULE | Freq: Three times a day (TID) | ORAL | 3 refills | Status: DC
  Filled 2021-02-01: qty 90, 30d supply, fill #0

## 2021-02-01 MED ORDER — LEVETIRACETAM 500 MG PO TABS
500.0000 mg | ORAL_TABLET | Freq: Two times a day (BID) | ORAL | 1 refills | Status: AC
  Filled 2021-02-01: qty 60, 30d supply, fill #0

## 2021-02-01 MED ORDER — NADOLOL 40 MG PO TABS
40.0000 mg | ORAL_TABLET | Freq: Every day | ORAL | 4 refills | Status: DC
Start: 2021-02-01 — End: 2023-09-12
  Filled 2021-02-01: qty 30, 30d supply, fill #0

## 2021-02-01 MED ORDER — BUDESONIDE-FORMOTEROL FUMARATE 160-4.5 MCG/ACT IN AERO
2.0000 | INHALATION_SPRAY | Freq: Two times a day (BID) | RESPIRATORY_TRACT | 12 refills | Status: DC
  Filled 2021-02-01: qty 10.2, 30d supply, fill #0

## 2021-02-01 MED ORDER — ALBUTEROL SULFATE HFA 108 (90 BASE) MCG/ACT IN AERS
2.0000 | INHALATION_SPRAY | Freq: Four times a day (QID) | RESPIRATORY_TRACT | 0 refills | Status: DC | PRN
  Filled 2021-02-01: qty 18, 25d supply, fill #0

## 2021-02-01 MED ORDER — VITAMIN B-1 100 MG PO TABS
100.0000 mg | ORAL_TABLET | Freq: Every day | ORAL | 3 refills | Status: AC
  Filled 2021-02-01: qty 30, 30d supply, fill #0

## 2021-02-01 MED ORDER — AMLODIPINE BESYLATE 10 MG PO TABS
10.0000 mg | ORAL_TABLET | Freq: Every day | ORAL | 3 refills | Status: DC
  Filled 2021-02-01: qty 30, 30d supply, fill #0

## 2021-02-01 NOTE — Telephone Encounter (Signed)
-----   Message from Storm Frisk, MD sent at 02/01/2021 10:49 AM EST ----- Let pt know all labs normal  vitamin levels normal  stay on B12, Thiamine and folic acid supplements

## 2021-02-01 NOTE — Telephone Encounter (Signed)
Pt was called and is aware of results, DOB was confirmed.  ?

## 2021-02-01 NOTE — Telephone Encounter (Signed)
He had requested a private pharmacy in [place]  he needs to apply for orange card/blue card and I will send meds to our pharmacy they can assist

## 2021-02-01 NOTE — Telephone Encounter (Signed)
Called Pt and he is aware of note

## 2021-02-08 ENCOUNTER — Other Ambulatory Visit: Payer: Self-pay

## 2021-04-28 NOTE — Progress Notes (Unsigned)
Established Patient Office Visit  Subjective:  Patient ID: Jacob Hill, male    DOB: [birth year]  Age: 51 y.o. MRN: [id number]  CC: No chief complaint on file.   HPI Jacob Hill presents for   01/29/2021 KHYAN OATS presents for primary care visit to reestablish.  He is a former Dr. Chapman Fitch patient.  Patient has history of alcohol abuse alcoholic cirrhotic liver disease and alcohol induced neuropathy.  He has had opioid dependence in the past but is now off methadone.  Patient has a history of seizure disorder but is out of his seizure medications at this time.  Transportations been a barrier for him to get up to the clinic.  Patient continues to have some degree of anxiety at this time.  He is out of all of his medications.  On arrival blood pressure elevated 171/112.   Patient continues to smoke does have some wheezing and shortness of breath.  Complains of right shoulder pain and rib pain.   He does have insurance at this time.  He drinks 240 ounce bottles of beer daily.     Patient declined to receive any vaccinations at this visit.  The patient is due a colonoscopy will wanted to defer this at this time 04/30/21  Essential hypertension, benign - Primary       Blood pressure not controlled not currently on medications at this time   Begin Corgard to control portal hypertension and systemic hypertension 40 mg daily   Begin amlodipine 10 mg daily   Check metabolic panel        Relevant Medications    amLODipine (NORVASC) 10 MG tablet    nadolol (CORGARD) 40 MG tablet    Other Relevant Orders    Comprehensive metabolic panel    CBC with Differential/Platelet    Lipid panel    Gastric varices      As per esophageal assessment        Relevant Medications    amLODipine (NORVASC) 10 MG tablet    nadolol (CORGARD) 40 MG tablet    Esophageal varices without bleeding (HCC)      No evidence of current variceal bleeding observe for now        Relevant  Medications    amLODipine (NORVASC) 10 MG tablet    nadolol (CORGARD) 40 MG tablet        Respiratory    COPD with chronic bronchitis (HCC)      Not currently exacerbating we will begin Symbicort 2 puffs twice daily as needed albuterol        Relevant Medications    budesonide-formoterol (SYMBICORT) 160-4.5 MCG/ACT inhaler    albuterol (VENTOLIN HFA) 108 (90 Base) MCG/ACT inhaler        Digestive    Alcoholic cirrhosis of liver (HCC)      Cirrhotic liver disease is been present for some time we will at some point need to get this patient back  into gastroenterology        Relevant Medications    folic acid (FOLVITE) 1 MG tablet    thiamine (VITAMIN B-1) 100 MG tablet    Other Relevant Orders    Comprehensive metabolic panel    CBC with Differential/Platelet    Vitamin B1    Folate    GERD (gastroesophageal reflux disease)      Continue proton pump inhibitor        Relevant Medications    folic acid (FOLVITE) 1 MG tablet  thiamine (VITAMIN B-1) 100 MG tablet    Portal hypertensive gastropathy (HCC)      Begin Corgard            Nervous and Auditory    Seizure disorder (HCC)      Renew Keppra        Relevant Medications    levETIRAcetam (KEPPRA) 500 MG tablet    gabapentin (NEURONTIN) 300 MG capsule        Hematopoietic and Hemostatic    Thrombocytopenia (HCC)      Check CBC            Other    Pernicious anemia      Check CBC and repeat folic acid levels        Relevant Medications    folic acid (FOLVITE) 1 MG tablet    Generalized anxiety disorder      Referral to clinical social work        Alcohol abuse      Referral to clinical social work given references for AA        Polysubstance abuse ([place])      States currently is not using        Tobacco dependence   Past Medical History:  Diagnosis Date   Alcohol abuse    Cirrhosis, alcoholic ([place]) 9024   seen with mild splenomegaly on CT 2013.  Hep ABC negative in 2013.    Coagulopathy ([place])  09/2011   elevated PT/INR   COPD (chronic obstructive pulmonary disease) (HCC)    DJD (degenerative joint disease)    GERD (gastroesophageal reflux disease)    Hypertension    Marijuana abuse    Neuropathy    Opiate dependence ([place]) 2010   treated with Methadone in past.    Osteoarthritis    Seizures ([place])    Thrombocytopenia ([place]) 09/2013   Tobacco abuse     Past Surgical History:  Procedure Laterality Date   ESOPHAGOGASTRODUODENOSCOPY (EGD) WITH PROPOFOL N/A 11/08/2015   Procedure: ESOPHAGOGASTRODUODENOSCOPY (EGD) WITH PROPOFOL;  Surgeon: Milus Banister, MD;  Location: [facility];  Service: Endoscopy;  Laterality: N/A;   HEMORRHOID SURGERY      Family History  Problem Relation Age of Onset   Diabetes Other    Hypertension Other    Diabetes Father     Social History   Socioeconomic History   Marital status: Single    Spouse name: Not on file   Number of children: Not on file   Years of education: Not on file   Highest education level: Not on file  Occupational History   Not on file  Tobacco Use   Smoking status: Every Day    Packs/day: 0.50    Years: 15.00    Pack years: 7.50    Types: Cigarettes   Smokeless tobacco: Never  Vaping Use   Vaping Use: Never used  Substance and Sexual Activity   Alcohol use: Yes    Alcohol/week: 9.0 standard drinks    Types: 9 Cans of beer per week    Comment: No beer in 7 days   Drug use: Yes    Types: Marijuana    Comment: Occas   Sexual activity: Not Currently  Other Topics Concern   Not on file  Social History Narrative   Not on file   Social Determinants of Health   Financial Resource Strain: Not on file  Food Insecurity: Not on file  Transportation Needs: Not on file  Physical Activity: Not on  file  Stress: Not on file  Social Connections: Not on file  Intimate Partner Violence: Not on file    Outpatient Medications Prior to Visit  Medication Sig Dispense Refill   albuterol (VENTOLIN HFA) 108 (90 Base)  MCG/ACT inhaler Inhale 2 puffs into the lungs every 6 (six) hours as needed for wheezing or shortness of breath. 18 g 0   amLODipine (NORVASC) 10 MG tablet Take 1 tablet (10 mg total) by mouth daily. 60 tablet 3   budesonide-formoterol (SYMBICORT) 160-4.5 MCG/ACT inhaler Inhale 2 puffs into the lungs 2 (two) times daily. 71.2 g 12   folic acid (FOLVITE) 1 MG tablet Take 1 tablet (1 mg total) by mouth daily. 30 tablet 3   gabapentin (NEURONTIN) 300 MG capsule Take 1 capsule (300 mg total) by mouth 3 (three) times daily. 90 capsule 3   levETIRAcetam (KEPPRA) 500 MG tablet Take 1 tablet (500 mg total) by mouth 2 (two) times daily. 60 tablet 1   nadolol (CORGARD) 40 MG tablet Take 1 tablet (40 mg total) by mouth daily. 60 tablet 4   thiamine (VITAMIN B-1) 100 MG tablet Take 1 tablet (100 mg total) by mouth daily. 30 tablet 3   No facility-administered medications prior to visit.    Allergies  Allergen Reactions   Codeine Nausea And Vomiting   Nsaids Nausea And Vomiting   Phenytoin Sodium Extended Nausea And Vomiting    ROS Review of Systems    Objective:    Physical Exam  There were no vitals taken for this visit. Wt Readings from Last 3 Encounters:  01/29/21 182 lb (82.6 kg)  08/14/19 145 lb (65.8 kg)  05/21/19 180 lb (81.6 kg)     There are no preventive care reminders to display for this patient.  There are no preventive care reminders to display for this patient.  Lab Results  Component Value Date   TSH 1.42 09/13/2014   Lab Results  Component Value Date   WBC 5.2 01/29/2021   HGB 15.7 01/29/2021   HCT 43.1 01/29/2021   MCV 93 01/29/2021   PLT 140 (L) 01/29/2021   Lab Results  Component Value Date   NA 143 01/29/2021   K 4.4 01/29/2021   CO2 21 01/29/2021   GLUCOSE 79 01/29/2021   BUN 8 01/29/2021   CREATININE 0.65 (L) 01/29/2021   BILITOT 0.9 01/29/2021   ALKPHOS 55 01/29/2021   AST 42 (H) 01/29/2021   ALT 23 01/29/2021   PROT 7.8 01/29/2021   ALBUMIN  3.9 (L) 01/29/2021   CALCIUM 9.6 01/29/2021   ANIONGAP 13 01/22/2020   EGFR 115 01/29/2021   GFR 155.70 09/13/2014   Lab Results  Component Value Date   CHOL 154 01/29/2021   Lab Results  Component Value Date   HDL 52 01/29/2021   Lab Results  Component Value Date   LDLCALC 92 01/29/2021   Lab Results  Component Value Date   TRIG 47 01/29/2021   Lab Results  Component Value Date   CHOLHDL 3.0 01/29/2021   Lab Results  Component Value Date   HGBA1C 4.5 (L) 03/31/2012      Assessment & Plan:   Problem List Items Addressed This Visit   None   No orders of the defined types were placed in this encounter.   Follow-up: No follow-ups on file.    Asencion Noble, MD

## 2021-04-30 ENCOUNTER — Ambulatory Visit: Payer: Medicaid Other | Admitting: Critical Care Medicine

## 2021-05-28 ENCOUNTER — Other Ambulatory Visit: Payer: Self-pay | Admitting: Critical Care Medicine

## 2021-05-29 NOTE — Telephone Encounter (Signed)
Requested Prescriptions  ?Pending Prescriptions Disp Refills  ?? albuterol (VENTOLIN HFA) 108 (90 Base) MCG/ACT inhaler [Pharmacy Med Name: ALBUTEROL SULFATE HFA 108 (90 BASE)] 6.7 g 0  ?  Sig: INHALE 2 PUFFS INTO THE LUNGS EVERY 6 HOURS AS NEEDED FOR WHEEZING OR SHORTNESS OF BREATH  ?  ? Pulmonology:  Beta Agonists 2 Failed - 05/28/2021  1:29 PM  ?  ?  Failed - Last BP in normal range  ?  BP Readings from Last 1 Encounters:  ?01/29/21 (!) 171/112  ?   ?  ?  Passed - Last Heart Rate in normal range  ?  Pulse Readings from Last 1 Encounters:  ?01/29/21 99  ?   ?  ?  Passed - Valid encounter within last 12 months  ?  Recent Outpatient Visits   ?      ? 4 months ago Essential hypertension, benign  ? [facility] Storm Frisk, MD  ? 1 year ago Seizure disorder [facility])  ? [facility] Frisk, MD  ? 2 years ago COPD with acute exacerbation [facility])  ? [facility] [place], Odette Horns, MD  ? 2 years ago COPD exacerbation [facility])  ? [facility] Frisk, MD  ? 2 years ago COPD with acute exacerbation [facility])  ? [facility] [place], Hewitt Shorts, MD  ?  ?  ? ?  ?  ?  ? ?

## 2021-06-27 ENCOUNTER — Other Ambulatory Visit: Payer: Self-pay | Admitting: Critical Care Medicine

## 2021-07-31 ENCOUNTER — Other Ambulatory Visit: Payer: Self-pay | Admitting: Critical Care Medicine

## 2021-07-31 NOTE — Telephone Encounter (Signed)
Requested Prescriptions  Pending Prescriptions Disp Refills  . gabapentin (NEURONTIN) 300 MG capsule [Pharmacy Med Name: gabapentin 300 mg capsule] 90 capsule 2    Sig: TAKE 1 CAPSULE BY MOUTH THREE TIMES DAILY     Neurology: Anticonvulsants - gabapentin Failed - 07/31/2021 12:13 PM      Failed - Cr in normal range and within 360 days    Creatinine, Ser  Date Value Ref Range Status  01/29/2021 0.65 (L) 0.76 - 1.27 mg/dL Final   Creatinine,U  Date Value Ref Range Status  01/13/2012 766.1 mg/dL Final    Comment:      Cutoff Values for Urine Drug Screen:         Drug Class           Cutoff (ng/mL)         Amphetamines            1000         Barbiturates             200         Cocaine Metabolites      300         Benzodiazepines          200         Methadone                300         Opiates                 2000         Phencyclidine             25         Propoxyphene             300         Marijuana Metabolites     50   For medical purposes only.         Failed - Completed PHQ-2 or PHQ-9 in the last 360 days      Passed - Valid encounter within last 12 months    Recent Outpatient Visits          6 months ago Essential hypertension, benign   [place] [facility]isk, MD   2 years ago Seizure disorder [facility])   [place] [facility] Storm Frisk, MD   3 years ago COPD with acute exacerbation [facility])   [place] [facility] Hoy Register, MD   3 years ago COPD exacerbation [facility])   [facility] Storm Frisk, MD   3 years ago COPD with acute exacerbation [facility])   [place] [facility] Cain Saupe, MD

## 2021-08-09 ENCOUNTER — Other Ambulatory Visit: Payer: Self-pay | Admitting: Critical Care Medicine

## 2021-09-17 ENCOUNTER — Other Ambulatory Visit: Payer: Self-pay | Admitting: Critical Care Medicine

## 2021-09-17 DIAGNOSIS — K219 Gastro-esophageal reflux disease without esophagitis: Secondary | ICD-10-CM

## 2021-09-17 DIAGNOSIS — K7030 Alcoholic cirrhosis of liver without ascites: Secondary | ICD-10-CM

## 2021-09-18 NOTE — Telephone Encounter (Signed)
Requested medication (s) are due for refill today - yes  Requested medication (s) are on the active medication list -yes  Future visit scheduled -no  Last refill: 02/01/21 #60 3RF  Notes to clinic: Attempted to call patient- male advised wrong number listed. Request sent to provider for review   Requested Prescriptions  Pending Prescriptions Disp Refills   amLODipine (NORVASC) 10 MG tablet [Pharmacy Med Name: amlodipine 10 mg tablet] 60 tablet 3    Sig: TAKE 1 TABLET BY MOUTH DAILY     Cardiovascular: Calcium Channel Blockers 2 Failed - 09/17/2021 12:23 PM      Failed - Last BP in normal range    BP Readings from Last 1 Encounters:  01/29/21 (!) 171/112         Failed - Valid encounter within last 6 months    Recent Outpatient Visits           7 months ago Essential hypertension, benign   [place] [facility] Storm Frisk, MD   2 years ago Seizure disorder [facility])   [place] [facility] Storm Frisk, MD   3 years ago COPD with acute exacerbation [facility])   [place] [facility] Hoy Register, MD   3 years ago COPD exacerbation [facility])   [place] [facility] Storm Frisk, MD   3 years ago COPD with acute exacerbation [facility])   [place] [facility] [place], [place], MD              Passed - Last Heart Rate in normal range    Pulse Readings from Last 1 Encounters:  01/29/21 99         Signed Prescriptions Disp Refills   folic acid (FOLVITE) 1 MG tablet 30 tablet 3    Sig: TAKE 1 TABLET BY MOUTH DAILY     Endocrinology:  Vitamins Passed - 09/17/2021 12:23 PM      Passed - Valid encounter within last 12 months    Recent Outpatient Visits           7 months ago Essential hypertension, benign   [place] [facility] Storm Frisk, MD   2 years ago Seizure disorder [facility])   [place] [facility] Storm Frisk,  MD   3 years ago COPD with acute exacerbation [facility])   [place] [facility] Hoy Register, MD   3 years ago COPD exacerbation [facility])   [place] [facility] Storm Frisk, MD   3 years ago COPD with acute exacerbation (HCC)   [place] [facility] [place], [place], MD                 Requested Prescriptions  Pending Prescriptions Disp Refills   amLODipine (NORVASC) 10 MG tablet [Pharmacy Med Name: amlodipine 10 mg tablet] 60 tablet 3    Sig: TAKE 1 TABLET BY MOUTH DAILY     Cardiovascular: Calcium Channel Blockers 2 Failed - 09/17/2021 12:23 PM      Failed - Last BP in normal range    BP Readings from Last 1 Encounters:  01/29/21 (!) 171/112         Failed - Valid encounter within last 6 months    Recent Outpatient Visits           7 months ago Essential hypertension, benign   [place] [facility]  [facility] Storm Frisk, MD   2 years ago Seizure disorder [facility])   [place] [facility] Storm Frisk, MD   3 years ago COPD with acute exacerbation [facility])   [place] [facility] Hoy Register, MD   3 years ago COPD exacerbation [facility])   [place] [facility] Storm Frisk, MD   3 years ago COPD with acute exacerbation [facility])   [place] [facility] [place], [place], MD              Passed - Last Heart Rate in normal range    Pulse Readings from Last 1 Encounters:  01/29/21 99         Signed Prescriptions Disp Refills   folic acid (FOLVITE) 1 MG tablet 30 tablet 3    Sig: TAKE 1 TABLET BY MOUTH DAILY     Endocrinology:  Vitamins Passed - 09/17/2021 12:23 PM      Passed - Valid encounter within last 12 months    Recent Outpatient Visits           7 months ago Essential hypertension, benign   [place] [facility] Storm Frisk, MD   2 years ago Seizure disorder [facility])   [facility] Storm Frisk, MD   3 years ago COPD with acute exacerbation [facility])   [place] [facility] Hoy Register, MD   3 years ago COPD exacerbation [facility])   [place] [facility] Storm Frisk, MD   3 years ago COPD with acute exacerbation [facility])    [facility] Cain Saupe, MD

## 2021-09-18 NOTE — Telephone Encounter (Signed)
Requested Prescriptions  Pending Prescriptions Disp Refills  . folic acid (FOLVITE) 1 MG tablet [Pharmacy Med Name: folic acid 1 mg tablet] 30 tablet 3    Sig: TAKE 1 TABLET BY MOUTH DAILY     Endocrinology:  Vitamins Passed - 09/17/2021 12:23 PM      Passed - Valid encounter within last 12 months    Recent Outpatient Visits          7 months ago Essential hypertension, benign   [place] [facility] Storm Frisk, MD   2 years ago Seizure disorder [facility])   [place] [facility] Storm Frisk, MD   3 years ago COPD with acute exacerbation [facility])   [place] [facility] Hoy Register, MD   3 years ago COPD exacerbation [facility])   [place] [facility] Storm Frisk, MD   3 years ago COPD with acute exacerbation (HCC)   [place] [facility] [place], [place], MD             . amLODipine (NORVASC) 10 MG tablet [Pharmacy Med Name: amlodipine 10 mg tablet] 60 tablet 3    Sig: TAKE 1 TABLET BY MOUTH DAILY     Cardiovascular: Calcium Channel Blockers 2 Failed - 09/17/2021 12:23 PM      Failed - Last BP in normal range    BP Readings from Last 1 Encounters:  01/29/21 (!) 171/112         Failed - Valid encounter within last 6 months    Recent Outpatient Visits          7 months ago Essential hypertension, benign   [place] [facility] Storm Frisk, MD   2 years ago Seizure disorder [facility])   [place] [facility] Storm Frisk, MD   3 years ago COPD with acute exacerbation [facility])   [place] [facility] Hoy Register, MD   3 years ago COPD exacerbation [facility])   [place] [facility] Storm Frisk, MD   3 years ago COPD with acute exacerbation [facility])   [place] [facility] [place], [place], MD             Passed - Last Heart Rate in normal range    Pulse Readings  from Last 1 Encounters:  01/29/21 99

## 2021-09-19 ENCOUNTER — Telehealth: Payer: Self-pay

## 2021-09-19 NOTE — Telephone Encounter (Signed)
Patient appearing on report for [employer] - Hypertension Control report due to last documented ambulatory blood pressure of 171/112 mmHg on 01/29/21. Next appointment with PCP is not scheduled.    Outreached patient to discuss hypertension control and medication management. However, a woman answered the phone stating she was not Maggie Font and request we stop calling her.   Valeda Malm, Pharm.D. PGY-2 Ambulatory Care Pharmacy Resident 09/19/2021 10:09 AM

## 2021-11-27 ENCOUNTER — Other Ambulatory Visit: Payer: Self-pay

## 2021-11-27 ENCOUNTER — Emergency Department (HOSPITAL_BASED_OUTPATIENT_CLINIC_OR_DEPARTMENT_OTHER): Payer: Self-pay

## 2021-11-27 ENCOUNTER — Encounter (HOSPITAL_BASED_OUTPATIENT_CLINIC_OR_DEPARTMENT_OTHER): Payer: Self-pay | Admitting: Emergency Medicine

## 2021-11-27 ENCOUNTER — Emergency Department (HOSPITAL_BASED_OUTPATIENT_CLINIC_OR_DEPARTMENT_OTHER)
Admission: EM | Admit: 2021-11-27 | Discharge: 2021-11-27 | Disposition: A | Discharge status: Home / Self Care | Payer: Self-pay | Attending: Emergency Medicine | Admitting: Emergency Medicine

## 2021-11-27 DIAGNOSIS — W01198A Fall on same level from slipping, tripping and stumbling with subsequent striking against other object, initial encounter: Secondary | ICD-10-CM | POA: Insufficient documentation

## 2021-11-27 DIAGNOSIS — S62357A Nondisplaced fracture of shaft of fifth metacarpal bone, left hand, initial encounter for closed fracture: Secondary | ICD-10-CM | POA: Insufficient documentation

## 2021-11-27 DIAGNOSIS — S0081XA Abrasion of other part of head, initial encounter: Secondary | ICD-10-CM | POA: Insufficient documentation

## 2021-11-27 DIAGNOSIS — Y9259 Other trade areas as the place of occurrence of the external cause: Secondary | ICD-10-CM | POA: Insufficient documentation

## 2021-11-27 DIAGNOSIS — Z79899 Other long term (current) drug therapy: Secondary | ICD-10-CM | POA: Insufficient documentation

## 2021-11-27 DIAGNOSIS — S40812A Abrasion of left upper arm, initial encounter: Secondary | ICD-10-CM | POA: Insufficient documentation

## 2021-11-27 DIAGNOSIS — Z23 Encounter for immunization: Secondary | ICD-10-CM | POA: Insufficient documentation

## 2021-11-27 MED ORDER — TETANUS-DIPHTH-ACELL PERTUSSIS 5-2.5-18.5 LF-MCG/0.5 IM SUSY
0.5000 mL | PREFILLED_SYRINGE | Freq: Once | INTRAMUSCULAR | Status: AC
  Administered 2021-11-27: 0.5 mL via INTRAMUSCULAR
  Filled 2021-11-27: qty 0.5

## 2021-11-27 MED ORDER — OXYCODONE HCL 5 MG PO TABS: 5.0000 mg | ORAL_TABLET | Freq: Four times a day (QID) | ORAL | 0 refills | Status: DC | PRN

## 2021-11-27 MED ORDER — GABAPENTIN 300 MG PO CAPS: 300.0000 mg | ORAL_CAPSULE | Freq: Three times a day (TID) | ORAL | 0 refills | Status: DC

## 2021-11-27 MED ORDER — OXYCODONE HCL 5 MG PO TABS
5.0000 mg | ORAL_TABLET | ORAL | Status: AC
  Administered 2021-11-27: 5 mg via ORAL
  Filled 2021-11-27: qty 1

## 2021-11-27 NOTE — ED Provider Notes (Signed)
Asking for refill of gabapentin for his neuropathy. 30 day supply sent to pharmacy.    Fransico Meadow, MD 11/27/21 [phone]
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
## 2021-11-27 NOTE — ED Notes (Signed)
Discharge instructions reviewed with patient. Patient verbalizes understanding, no further questions at this time. Medications/prescriptions and follow up information provided. No acute distress noted at time of departure.  

## 2021-11-27 NOTE — ED Triage Notes (Signed)
Mechanical fall secondary to neuropathy that occurred yesterday. landed on left arm and hit left side of head. Left  hand swollen a bruised upon assessment. Abrasion to left side of head, behind ear. Denies loc, blood thinners, hip/neck/back pain.

## 2021-11-27 NOTE — Discharge Instructions (Signed)
Today you were seen in the emergency department for your broken hand.    In the emergency department you placed in a splint.    At home, please take Motrin as needed for your pain.    Check your MyChart online for the results of any tests that had not resulted by the time you left the emergency department.   Follow-up orthopedics in 1 to 2 weeks.  Return immediately to the emergency department if you experience any of the following: Severe pain, numbness of your hand, or any other concerning symptoms.    Thank you for visiting our Emergency Department. It was a pleasure taking care of you today.

## 2022-03-12 DIAGNOSIS — G609 Hereditary and idiopathic neuropathy, unspecified: Secondary | ICD-10-CM | POA: Diagnosis not present

## 2022-03-12 DIAGNOSIS — I10 Essential (primary) hypertension: Secondary | ICD-10-CM | POA: Diagnosis not present

## 2022-03-12 DIAGNOSIS — J449 Chronic obstructive pulmonary disease, unspecified: Secondary | ICD-10-CM | POA: Diagnosis not present

## 2022-03-12 DIAGNOSIS — K7460 Unspecified cirrhosis of liver: Secondary | ICD-10-CM | POA: Diagnosis not present

## 2022-07-09 ENCOUNTER — Ambulatory Visit
Admission: EM | Admit: 2022-07-09 | Discharge: 2022-07-09 | Disposition: A | Discharge status: Home / Self Care | Payer: Medicaid Other | Attending: Family Medicine | Admitting: Family Medicine

## 2022-07-09 ENCOUNTER — Other Ambulatory Visit: Payer: Self-pay

## 2022-07-09 ENCOUNTER — Encounter: Payer: Self-pay | Admitting: Emergency Medicine

## 2022-07-09 DIAGNOSIS — G629 Polyneuropathy, unspecified: Secondary | ICD-10-CM | POA: Diagnosis not present

## 2022-07-09 MED ORDER — GABAPENTIN 300 MG PO CAPS: 300.0000 mg | ORAL_CAPSULE | Freq: Three times a day (TID) | ORAL | 0 refills | Status: DC

## 2022-07-09 NOTE — ED Provider Notes (Signed)
[facility]    CSN: [id number] Arrival date & time: 07/09/22  1207      History   Chief Complaint Chief Complaint  Patient presents with   Medication Refill    HPI Jacob Hill is a 52 y.o. male.   Patient is here for refill of gabapentin.  He takes this for chronic neuropathy, but ran out 10 days ago.  He has pain "all over" at this point.  It is worse in his feet, but he has pain in his arms, legs, neck, back, everywhere.  He is not able to sleep due to the pain.  He states he usually takes the meds at night, as that is when his pain is worse.  He does have an apt with his pcp next month, which he made last month.        Past Medical History:  Diagnosis Date   Alcohol abuse    Cirrhosis, alcoholic (HCC) 2013   seen with mild splenomegaly on CT 2013.  Hep ABC negative in 2013.    Coagulopathy (HCC) 09/2011   elevated PT/INR   COPD (chronic obstructive pulmonary disease) (HCC)    DJD (degenerative joint disease)    GERD (gastroesophageal reflux disease)    Hypertension    Marijuana abuse    Neuropathy    Opiate dependence (HCC) 2010   treated with Methadone in past.    Osteoarthritis    Seizures (HCC)    Thrombocytopenia (HCC) 09/2013   Tobacco abuse     Patient Active Problem List   Diagnosis Date Noted   Shoulder pain 01/29/2021   Right flank pain 08/15/2019   Tobacco dependence 06/14/2018   Polysubstance abuse (HCC) 05/08/2018   COPD with chronic bronchitis 05/08/2018   Portal hypertensive gastropathy (HCC)    Esophageal varices without bleeding (HCC)    Thrombocytopenia (HCC) 11/07/2015   Gastric varices 11/07/2015   GERD (gastroesophageal reflux disease)    Alcohol abuse    Common bile duct dilation    Generalized anxiety disorder 05/11/2014   Pernicious anemia 11/03/2013   Essential hypertension, benign 06/30/2012   Alcoholic cirrhosis of liver (HCC) 02/05/2012   Seizure disorder (HCC) 11/21/2011   DJD (degenerative joint  disease), ankle and foot 11/08/2010   Idiopathic peripheral neuropathy 01/06/2009    Past Surgical History:  Procedure Laterality Date   ESOPHAGOGASTRODUODENOSCOPY (EGD) WITH PROPOFOL N/A 11/08/2015   Procedure: ESOPHAGOGASTRODUODENOSCOPY (EGD) WITH PROPOFOL;  Surgeon: Rachael Fee, MD;  Location: [facility];  Service: Endoscopy;  Laterality: N/A;   HEMORRHOID SURGERY         Home Medications    Prior to Admission medications   Medication Sig Start Date End Date Taking? Authorizing Provider  albuterol (VENTOLIN HFA) 108 (90 Base) MCG/ACT inhaler INHALE 2 PUFFS INTO THE LUNGS EVERY 6 HOURS AS NEEDED FOR WHEEZING OR SHORTNESS OF BREATH 06/27/21   Storm Frisk, MD  amLODipine (NORVASC) 10 MG tablet TAKE 1 TABLET BY MOUTH DAILY 09/19/21   Storm Frisk, MD  budesonide-formoterol [facility]) 160-4.5 MCG/ACT inhaler Inhale 2 puffs into the lungs 2 (two) times daily. 02/01/21 02/01/22  Storm Frisk, MD  folic acid (FOLVITE) 1 MG tablet TAKE 1 TABLET BY MOUTH DAILY 09/18/21   Storm Frisk, MD  gabapentin (NEURONTIN) 300 MG capsule Take 1 capsule (300 mg total) by mouth 3 (three) times daily. 11/27/21 12/27/21  Rondel Baton, MD  levETIRAcetam (KEPPRA) 500 MG tablet Take 1 tablet (500 mg total) by mouth 2 (  two) times daily. 02/01/21   Storm Frisk, MD  nadolol (CORGARD) 40 MG tablet Take 1 tablet (40 mg total) by mouth daily. 02/01/21   Storm Frisk, MD  oxyCODONE (ROXICODONE) 5 MG immediate release tablet Take 1 tablet (5 mg total) by mouth every 6 (six) hours as needed for severe pain. 11/27/21   Rondel Baton, MD  thiamine (VITAMIN B-1) 100 MG tablet Take 1 tablet (100 mg total) by mouth daily. 02/01/21   Storm Frisk, MD    Family History Family History  Problem Relation Age of Onset   Diabetes Other    Hypertension Other    Diabetes Father     Social History Social History   Tobacco Use   Smoking status: Every Day    Packs/day: 0.50     Years: 15.00    Additional pack years: 0.00    Total pack years: 7.50    Types: Cigarettes   Smokeless tobacco: Never  Vaping Use   Vaping Use: Never used  Substance Use Topics   Alcohol use: Yes    Alcohol/week: 9.0 standard drinks of alcohol    Types: 9 Cans of beer per week    Comment: No beer in 7 days   Drug use: Yes    Types: Marijuana    Comment: Occas     Allergies   Codeine, Nsaids, and Phenytoin sodium extended   Review of Systems Review of Systems  Constitutional: Negative.   HENT: Negative.    Respiratory: Negative.    Cardiovascular: Negative.   Gastrointestinal: Negative.   Musculoskeletal:  Positive for back pain and myalgias.  Hematological: Negative.   Psychiatric/Behavioral: Negative.       Physical Exam Triage Vital Signs ED Triage Vitals  Enc Vitals Group     BP 07/09/22 1320 (!) 176/100     Pulse Rate 07/09/22 1320 89     Resp 07/09/22 1320 18     Temp 07/09/22 1320 98 F (36.7 C)     Temp Source 07/09/22 1320 Oral     SpO2 07/09/22 1320 95 %     Weight --      Height --      Head Circumference --      Peak Flow --      Pain Score 07/09/22 1321 5     Pain Loc --      Pain Edu? --      Excl. in GC? --    No data found.  Updated Vital Signs BP (!) 176/100 (BP Location: Left Arm)   Pulse 89   Temp 98 F (36.7 C) (Oral)   Resp 18   SpO2 95%   Visual Acuity Right Eye Distance:   Left Eye Distance:   Bilateral Distance:    Right Eye Near:   Left Eye Near:    Bilateral Near:     Physical Exam Constitutional:      Appearance: Normal appearance.  Cardiovascular:     Rate and Rhythm: Normal rate and regular rhythm.  Pulmonary:     Effort: Pulmonary effort is normal.     Breath sounds: Normal breath sounds.  Skin:    General: Skin is warm.  Neurological:     General: No focal deficit present.     Mental Status: He is alert.  Psychiatric:        Mood and Affect: Mood normal.      UC Treatments / Results  Labs (all  labs ordered are  listed, but only abnormal results are displayed) Labs Reviewed - No data to display  EKG   Radiology No results found.  Procedures Procedures (including critical care time)  Medications Ordered in UC Medications - No data to display  Initial Impression / Assessment and Plan / UC Course  I have reviewed the triage vital signs and the nursing notes.  Pertinent labs & imaging results that were available during my care of the patient were reviewed by me and considered in my medical decision making (see chart for details).   Final Clinical Impressions(s) / UC Diagnoses   Final diagnoses:  Neuropathy     Discharge Instructions      You were seen today for a refill of gabapentin for your chronic neuropathy, which was done today.  Please keep the appointment with your primary care provider next month as already scheduled.     ED Prescriptions     Medication Sig Dispense Auth. Provider   gabapentin (NEURONTIN) 300 MG capsule Take 1 capsule (300 mg total) by mouth 3 (three) times daily. 90 capsule Jannifer Franklin, MD      PDMP not reviewed this encounter.   Jannifer Franklin, MD 07/09/22 1329

## 2022-07-09 NOTE — ED Triage Notes (Signed)
Pt here for chronic neuropathy that he takes gabapentin for; pt sts out of meds x 10 days; pt sts has PCP appointment next month

## 2022-07-09 NOTE — Discharge Instructions (Signed)
You were seen today for a refill of gabapentin for your chronic neuropathy, which was done today.  Please keep the appointment with your primary care provider next month as already scheduled.

## 2022-08-07 ENCOUNTER — Encounter: Payer: Self-pay | Admitting: Family Medicine

## 2022-08-07 ENCOUNTER — Ambulatory Visit (INDEPENDENT_AMBULATORY_CARE_PROVIDER_SITE_OTHER): Payer: Medicaid Other | Admitting: Family Medicine

## 2022-08-07 VITALS — BP 129/79 | HR 89 | Temp 98.1°F | Resp 18 | Ht 72.0 in | Wt 178.0 lb

## 2022-08-07 DIAGNOSIS — I10 Essential (primary) hypertension: Secondary | ICD-10-CM

## 2022-08-07 DIAGNOSIS — IMO0001 Reserved for inherently not codable concepts without codable children: Secondary | ICD-10-CM

## 2022-08-07 DIAGNOSIS — K7030 Alcoholic cirrhosis of liver without ascites: Secondary | ICD-10-CM | POA: Diagnosis not present

## 2022-08-07 DIAGNOSIS — K219 Gastro-esophageal reflux disease without esophagitis: Secondary | ICD-10-CM

## 2022-08-07 DIAGNOSIS — F17210 Nicotine dependence, cigarettes, uncomplicated: Secondary | ICD-10-CM | POA: Diagnosis not present

## 2022-08-07 DIAGNOSIS — Z7689 Persons encountering health services in other specified circumstances: Secondary | ICD-10-CM

## 2022-08-07 DIAGNOSIS — F17200 Nicotine dependence, unspecified, uncomplicated: Secondary | ICD-10-CM

## 2022-08-07 MED ORDER — BUDESONIDE-FORMOTEROL FUMARATE 160-4.5 MCG/ACT IN AERO: 2.0000 | INHALATION_SPRAY | Freq: Two times a day (BID) | RESPIRATORY_TRACT | 5 refills | Status: DC

## 2022-08-07 MED ORDER — GABAPENTIN 600 MG PO TABS: 600.0000 mg | ORAL_TABLET | Freq: Two times a day (BID) | ORAL | 1 refills | Status: DC

## 2022-08-07 MED ORDER — ALBUTEROL SULFATE HFA 108 (90 BASE) MCG/ACT IN AERS: 2.0000 | INHALATION_SPRAY | Freq: Four times a day (QID) | RESPIRATORY_TRACT | 2 refills | Status: DC | PRN

## 2022-08-07 MED ORDER — AMLODIPINE BESYLATE 10 MG PO TABS: 10.0000 mg | ORAL_TABLET | Freq: Every day | ORAL | 1 refills | Status: DC

## 2022-08-07 MED ORDER — FOLIC ACID 1 MG PO TABS: 1.0000 mg | ORAL_TABLET | Freq: Every day | ORAL | 1 refills | Status: AC

## 2022-08-07 NOTE — Progress Notes (Unsigned)
Patient is here to established care with provider. ~health hx address ~care gaps address  

## 2022-08-08 ENCOUNTER — Encounter: Payer: Self-pay | Admitting: Family Medicine

## 2022-08-08 NOTE — Progress Notes (Signed)
New Patient Office Visit  Subjective    Patient ID: Jacob Hill, male    DOB: [birth year]  Age: 52 y.o. MRN: [id number]  CC:  Chief Complaint  Patient presents with   Establish Care    HPI Jacob Hill presents to establish care and for review of chronic med issues. Patient denies acute complaints.   Outpatient Encounter Medications as of 08/07/2022  Medication Sig   gabapentin (NEURONTIN) 300 MG capsule Take 1 capsule (300 mg total) by mouth 3 (three) times daily.   gabapentin (NEURONTIN) 600 MG tablet Take 1 tablet (600 mg total) by mouth 2 (two) times daily.   [DISCONTINUED] albuterol (VENTOLIN HFA) 108 (90 Base) MCG/ACT inhaler INHALE 2 PUFFS INTO THE LUNGS EVERY 6 HOURS AS NEEDED FOR WHEEZING OR SHORTNESS OF BREATH   [DISCONTINUED] amLODipine (NORVASC) 10 MG tablet TAKE 1 TABLET BY MOUTH DAILY   [DISCONTINUED] budesonide-formoterol (SYMBICORT) 160-4.5 MCG/ACT inhaler Inhale 2 puffs into the lungs 2 (two) times daily.   [DISCONTINUED] folic acid (FOLVITE) 1 MG tablet TAKE 1 TABLET BY MOUTH DAILY   albuterol (VENTOLIN HFA) 108 (90 Base) MCG/ACT inhaler Inhale 2 puffs into the lungs every 6 (six) hours as needed for wheezing or shortness of breath.   amLODipine (NORVASC) 10 MG tablet Take 1 tablet (10 mg total) by mouth daily.   budesonide-formoterol (SYMBICORT) 160-4.5 MCG/ACT inhaler Inhale 2 puffs into the lungs 2 (two) times daily.   folic acid (FOLVITE) 1 MG tablet Take 1 tablet (1 mg total) by mouth daily.   levETIRAcetam (KEPPRA) 500 MG tablet Take 1 tablet (500 mg total) by mouth 2 (two) times daily. (Patient not taking: Reported on 08/07/2022)   nadolol (CORGARD) 40 MG tablet Take 1 tablet (40 mg total) by mouth daily. (Patient not taking: Reported on 08/07/2022)   oxyCODONE (ROXICODONE) 5 MG immediate release tablet Take 1 tablet (5 mg total) by mouth every 6 (six) hours as needed for severe pain. (Patient not taking: Reported on 08/07/2022)   thiamine (VITAMIN  B-1) 100 MG tablet Take 1 tablet (100 mg total) by mouth daily. (Patient not taking: Reported on 08/07/2022)   No facility-administered encounter medications on file as of 08/07/2022.    Past Medical History:  Diagnosis Date   Alcohol abuse    Cirrhosis, alcoholic (HCC) 2013   seen with mild splenomegaly on CT 2013.  Hep ABC negative in 2013.    Coagulopathy (HCC) 09/2011   elevated PT/INR   COPD (chronic obstructive pulmonary disease) (HCC)    DJD (degenerative joint disease)    GERD (gastroesophageal reflux disease)    Hypertension    Marijuana abuse    Neuropathy    Opiate dependence (HCC) 2010   treated with Methadone in past.    Osteoarthritis    Seizures (HCC)    Thrombocytopenia (HCC) 09/2013   Tobacco abuse     Past Surgical History:  Procedure Laterality Date   ESOPHAGOGASTRODUODENOSCOPY (EGD) WITH PROPOFOL N/A 11/08/2015   Procedure: ESOPHAGOGASTRODUODENOSCOPY (EGD) WITH PROPOFOL;  Surgeon: Rachael Fee, MD;  Location: [facility];  Service: Endoscopy;  Laterality: N/A;   HEMORRHOID SURGERY      Family History  Problem Relation Age of Onset   Diabetes Other    Hypertension Other    Diabetes Father     Social History   Socioeconomic History   Marital status: Single    Spouse name: Not on file   Number of children: Not on file   Years of education:  Not on file   Highest education level: Not on file  Occupational History   Not on file  Tobacco Use   Smoking status: Every Day    Packs/day: 0.50    Years: 15.00    Additional pack years: 0.00    Total pack years: 7.50    Types: Cigarettes   Smokeless tobacco: Never  Vaping Use   Vaping Use: Never used  Substance and Sexual Activity   Alcohol use: Yes    Alcohol/week: 9.0 standard drinks of alcohol    Types: 9 Cans of beer per week    Comment: No beer in 7 days   Drug use: Yes    Types: Marijuana    Comment: Occas   Sexual activity: Not Currently  Other Topics Concern   Not on file  Social  History Narrative   Not on file   Social Determinants of Health   Financial Resource Strain: Not on file  Food Insecurity: Not on file  Transportation Needs: Not on file  Physical Activity: Not on file  Stress: Not on file  Social Connections: Not on file  Intimate Partner Violence: Not on file    Review of Systems  All other systems reviewed and are negative.       Objective    BP 129/79   Pulse 89   Temp 98.1 F (36.7 C) (Oral)   Resp 18   Ht 6' (1.829 m)   Wt 178 lb (80.7 kg)   SpO2 93%   BMI 24.14 kg/m   Physical Exam Vitals and nursing note reviewed.  Constitutional:      General: He is not in acute distress. Cardiovascular:     Rate and Rhythm: Normal rate and regular rhythm.  Pulmonary:     Effort: Pulmonary effort is normal.     Breath sounds: Normal breath sounds.  Abdominal:     Palpations: Abdomen is soft.     Tenderness: There is no abdominal tenderness.  Neurological:     General: No focal deficit present.     Mental Status: He is alert and oriented to person, place, and time.         Assessment & Plan:   1. Essential hypertension, benign Appears stable. Continue. Meds refilled.   2. Gastroesophageal reflux disease without esophagitis  - folic acid (FOLVITE) 1 MG tablet; Take 1 tablet (1 mg total) by mouth daily.  Dispense: 90 tablet; Refill: 1  3. Alcoholic cirrhosis of liver without ascites (HCC) Meds refilled.  - folic acid (FOLVITE) 1 MG tablet; Take 1 tablet (1 mg total) by mouth daily.  Dispense: 90 tablet; Refill: 1  4. Smoking Discussed reduction/cessation  5. Encounter to establish care      Return in about 3 months (around 11/07/2022) for follow up.   Tommie Raymond, MD

## 2023-03-18 ENCOUNTER — Ambulatory Visit (INDEPENDENT_AMBULATORY_CARE_PROVIDER_SITE_OTHER): Payer: Medicaid Other | Admitting: Family Medicine

## 2023-03-18 ENCOUNTER — Encounter: Payer: Self-pay | Admitting: Family Medicine

## 2023-03-18 ENCOUNTER — Other Ambulatory Visit: Payer: Self-pay | Admitting: Family Medicine

## 2023-03-18 VITALS — BP 144/88 | HR 100 | Temp 98.0°F | Resp 18 | Ht 72.0 in | Wt 190.4 lb

## 2023-03-18 DIAGNOSIS — F1010 Alcohol abuse, uncomplicated: Secondary | ICD-10-CM

## 2023-03-18 DIAGNOSIS — J42 Unspecified chronic bronchitis: Secondary | ICD-10-CM | POA: Diagnosis not present

## 2023-03-18 DIAGNOSIS — I10 Essential (primary) hypertension: Secondary | ICD-10-CM

## 2023-03-18 DIAGNOSIS — J449 Chronic obstructive pulmonary disease, unspecified: Secondary | ICD-10-CM

## 2023-03-18 DIAGNOSIS — K219 Gastro-esophageal reflux disease without esophagitis: Secondary | ICD-10-CM

## 2023-03-18 DIAGNOSIS — F17200 Nicotine dependence, unspecified, uncomplicated: Secondary | ICD-10-CM

## 2023-03-18 MED ORDER — GABAPENTIN 600 MG PO TABS: 600.0000 mg | ORAL_TABLET | Freq: Three times a day (TID) | ORAL | 1 refills | Status: DC

## 2023-03-18 MED ORDER — AMLODIPINE BESYLATE 10 MG PO TABS: 10.0000 mg | ORAL_TABLET | Freq: Every day | ORAL | 1 refills | Status: AC

## 2023-03-19 ENCOUNTER — Telehealth: Payer: Self-pay | Admitting: Emergency Medicine

## 2023-03-20 ENCOUNTER — Encounter: Payer: Self-pay | Admitting: Family Medicine

## 2023-03-20 NOTE — Progress Notes (Signed)
Established Patient Office Visit  Subjective    Patient ID: Jacob Hill, male    DOB: 02-07-1971  Age: 53 y.o. MRN: [id number]  CC:  Chief Complaint  Patient presents with   Medication Refill    HPI Jacob Hill presents for routine follow up of chronic med issues including hypertension, GERD, and  COPD. Patient continues to drink alcohol several times a week and smoke. Patient denies acute complaints.   Outpatient Encounter Medications as of 03/18/2023  Medication Sig   budesonide-formoterol (SYMBICORT) 160-4.5 MCG/ACT inhaler Inhale 2 puffs into the lungs 2 (two) times daily.   folic acid (FOLVITE) 1 MG tablet Take 1 tablet (1 mg total) by mouth daily.   [DISCONTINUED] albuterol (VENTOLIN HFA) 108 (90 Base) MCG/ACT inhaler Inhale 2 puffs into the lungs every 6 (six) hours as needed for wheezing or shortness of breath.   [DISCONTINUED] amLODipine (NORVASC) 10 MG tablet Take 1 tablet (10 mg total) by mouth daily.   amLODipine (NORVASC) 10 MG tablet Take 1 tablet (10 mg total) by mouth daily.   gabapentin (NEURONTIN) 600 MG tablet Take 1 tablet (600 mg total) by mouth 3 (three) times daily.   levETIRAcetam (KEPPRA) 500 MG tablet Take 1 tablet (500 mg total) by mouth 2 (two) times daily. (Patient not taking: Reported on 08/07/2022)   nadolol (CORGARD) 40 MG tablet Take 1 tablet (40 mg total) by mouth daily. (Patient not taking: Reported on 08/07/2022)   oxyCODONE (ROXICODONE) 5 MG immediate release tablet Take 1 tablet (5 mg total) by mouth every 6 (six) hours as needed for severe pain. (Patient not taking: Reported on 08/07/2022)   thiamine (VITAMIN B-1) 100 MG tablet Take 1 tablet (100 mg total) by mouth daily. (Patient not taking: Reported on 08/07/2022)   [DISCONTINUED] gabapentin (NEURONTIN) 300 MG capsule Take 1 capsule (300 mg total) by mouth 3 (three) times daily.   [DISCONTINUED] gabapentin (NEURONTIN) 600 MG tablet Take 1 tablet (600 mg total) by mouth 2 (two) times  daily.   No facility-administered encounter medications on file as of 03/18/2023.    Past Medical History:  Diagnosis Date   Alcohol abuse    Cirrhosis, alcoholic (HCC) 2013   seen with mild splenomegaly on CT 2013.  Hep ABC negative in 2013.    Coagulopathy (HCC) 09/2011   elevated PT/INR   COPD (chronic obstructive pulmonary disease) (HCC)    DJD (degenerative joint disease)    GERD (gastroesophageal reflux disease)    Hypertension    Marijuana abuse    Neuropathy    Opiate dependence (HCC) 2010   treated with Methadone in past.    Osteoarthritis    Seizures (HCC)    Thrombocytopenia (HCC) 09/2013   Tobacco abuse     Past Surgical History:  Procedure Laterality Date   ESOPHAGOGASTRODUODENOSCOPY (EGD) WITH PROPOFOL N/A 11/08/2015   Procedure: ESOPHAGOGASTRODUODENOSCOPY (EGD) WITH PROPOFOL;  Surgeon: Rachael Fee, MD;  Location: [facility];  Service: Endoscopy;  Laterality: N/A;   HEMORRHOID SURGERY      Family History  Problem Relation Age of Onset   Diabetes Other    Hypertension Other    Diabetes Father     Social History   Socioeconomic History   Marital status: Single    Spouse name: Not on file   Number of children: Not on file   Years of education: Not on file   Highest education level: Not on file  Occupational History   Not on file  Tobacco  Use   Smoking status: Every Day    Current packs/day: 0.50    Average packs/day: 0.5 packs/day for 15.0 years (7.5 ttl pk-yrs)    Types: Cigarettes   Smokeless tobacco: Never  Vaping Use   Vaping status: Never Used  Substance and Sexual Activity   Alcohol use: Yes    Alcohol/week: 9.0 standard drinks of alcohol    Types: 9 Cans of beer per week    Comment: No beer in 7 days   Drug use: Yes    Types: Marijuana    Comment: Occas   Sexual activity: Not Currently  Other Topics Concern   Not on file  Social History Narrative   Not on file   Social Drivers of Health   Financial Resource Strain: High  Risk (03/18/2023)   Overall Financial Resource Strain (CARDIA)    Difficulty of Paying Living Expenses: Hard  Food Insecurity: No Food Insecurity (03/18/2023)   Hunger Vital Sign    Worried About Running Out of Food in the Last Year: Never true    Ran Out of Food in the Last Year: Never true  Transportation Needs: Not on file  Physical Activity: Inactive (03/18/2023)   Exercise Vital Sign    Days of Exercise per Week: 0 days    Minutes of Exercise per Session: 0 min  Stress: Stress Concern Present (03/18/2023)   [employer] of Occupational Health - Occupational Stress Questionnaire    Feeling of Stress : Rather much  Social Connections: Moderately Integrated (03/18/2023)   Social Connection and Isolation Panel [NHANES]    Frequency of Communication with Friends and Family: Twice a week    Frequency of Social Gatherings with Friends and Family: Once a week    Attends Religious Services: 1 to 4 times per year    Active Member of [employer] or Organizations: No    Attends Banker Meetings: Never    Marital Status: Living with partner  Intimate Partner Violence: Not At Risk (03/18/2023)   Humiliation, Afraid, Rape, and Kick questionnaire    Fear of Current or Ex-Partner: No    Emotionally Abused: No    Physically Abused: No    Sexually Abused: No    Review of Systems  All other systems reviewed and are negative.       Objective    BP (!) 144/88 (BP Location: Left Arm, Patient Position: Sitting, Cuff Size: Normal)   Pulse 100   Temp 98 F (36.7 C) (Oral)   Resp 18   Ht 6' (1.829 m)   Wt 190 lb 6.4 oz (86.4 kg)   SpO2 94%   BMI 25.82 kg/m   Physical Exam Vitals and nursing note reviewed.  Constitutional:      General: He is not in acute distress. Cardiovascular:     Rate and Rhythm: Normal rate and regular rhythm.  Pulmonary:     Effort: Pulmonary effort is normal.     Breath sounds: Normal breath sounds.  Abdominal:     Palpations: Abdomen is soft.      Tenderness: There is no abdominal tenderness.  Neurological:     General: No focal deficit present.     Mental Status: He is alert and oriented to person, place, and time.         Assessment & Plan:  1. Essential hypertension (Primary) Amlodipine refilled.   2. Gastroesophageal reflux disease without esophagitis Continue   3. Chronic bronchitis, unspecified chronic bronchitis type (HCC) Stable   4.  Smoking Discussed reduction/cessation  5. Alcohol abuse     No follow-ups on file.   Tommie Raymond, MD

## 2023-03-21 NOTE — Telephone Encounter (Signed)
error 

## 2023-04-22 ENCOUNTER — Other Ambulatory Visit: Payer: Self-pay | Admitting: Family Medicine

## 2023-07-16 ENCOUNTER — Encounter: Payer: Medicaid Other | Admitting: Family Medicine

## 2023-08-13 ENCOUNTER — Encounter: Payer: Self-pay | Admitting: Family Medicine

## 2023-08-13 ENCOUNTER — Ambulatory Visit: Admitting: Family Medicine

## 2023-08-13 VITALS — BP 146/84 | HR 96 | Temp 98.2°F | Ht 72.0 in | Wt 189.6 lb

## 2023-08-13 DIAGNOSIS — I10 Essential (primary) hypertension: Secondary | ICD-10-CM | POA: Diagnosis not present

## 2023-08-13 DIAGNOSIS — J4489 Other specified chronic obstructive pulmonary disease: Secondary | ICD-10-CM

## 2023-08-13 DIAGNOSIS — K7030 Alcoholic cirrhosis of liver without ascites: Secondary | ICD-10-CM | POA: Diagnosis not present

## 2023-08-13 DIAGNOSIS — G609 Hereditary and idiopathic neuropathy, unspecified: Secondary | ICD-10-CM | POA: Diagnosis not present

## 2023-08-13 DIAGNOSIS — Z23 Encounter for immunization: Secondary | ICD-10-CM | POA: Diagnosis not present

## 2023-08-13 DIAGNOSIS — F1010 Alcohol abuse, uncomplicated: Secondary | ICD-10-CM

## 2023-08-13 DIAGNOSIS — F331 Major depressive disorder, recurrent, moderate: Secondary | ICD-10-CM

## 2023-08-13 DIAGNOSIS — G40909 Epilepsy, unspecified, not intractable, without status epilepticus: Secondary | ICD-10-CM

## 2023-08-13 MED ORDER — ALBUTEROL SULFATE HFA 108 (90 BASE) MCG/ACT IN AERS: 2.0000 | INHALATION_SPRAY | RESPIRATORY_TRACT | 3 refills | Status: DC | PRN

## 2023-08-13 MED ORDER — BUDESONIDE-FORMOTEROL FUMARATE 160-4.5 MCG/ACT IN AERO: 2.0000 | INHALATION_SPRAY | Freq: Two times a day (BID) | RESPIRATORY_TRACT | 5 refills | Status: DC

## 2023-08-13 MED ORDER — GABAPENTIN 600 MG PO TABS: 600.0000 mg | ORAL_TABLET | Freq: Three times a day (TID) | ORAL | 1 refills | Status: DC

## 2023-08-13 MED ORDER — VENLAFAXINE HCL ER 37.5 MG PO CP24: ORAL_CAPSULE | ORAL | 0 refills | Status: DC

## 2023-08-13 NOTE — Patient Instructions (Signed)
  VISIT SUMMARY: Today, we discussed your ongoing foot pain, which has been a significant issue for you due to neuropathy and calciphylaxis. We also reviewed your seizure disorder, cirrhosis, and COPD management. Adjustments to your medication regimen were made to better manage your symptoms and improve your quality of life.  YOUR PLAN: -FOOT PAIN WITH NEUROPATHY: Your foot pain is due to neuropathy and which cause significant calcium deposits and arthritis in your toes. We will continue your current medication, gabapentin , and add venlafaxine to help manage your pain. You will also be referred to a pain management specialist for further evaluation and targeted treatments.  -SEIZURE DISORDER: Your seizure disorder has been well-managed with gabapentin , which you will continue to take. This medication helps control your seizures and also provides some relief for your foot pain.  -CIRRHOSIS: Cirrhosis is a condition where your liver is damaged, often due to alcohol use. We will monitor your liver function with routine lab tests to ensure your medications are safe and effective.  -CHRONIC OBSTRUCTIVE PULMONARY DISEASE (COPD): COPD is a chronic lung condition that causes breathing difficulties. You will continue using your Symbicort  and albuterol  inhalers to manage your symptoms. Refills for these inhalers have been provided.  -GENERAL HEALTH MAINTENANCE: Routine health maintenance is important for managing your overall health. We will schedule fasting blood work to assess your overall health and liver function.  INSTRUCTIONS: Please continue taking gabapentin  600 mg three times a day. Start taking venlafaxine 37.5 mg daily for one week, then increase to 75 mg daily. We have referred you to a pain management specialist at the [facility]; please coordinate with [facility] for this referral. Schedule fasting blood work in a couple of weeks to assess your overall health and liver function. We will also  schedule a follow-up appointment in a couple of weeks to a month to review your lab results and discuss your blood pressure management.

## 2023-08-13 NOTE — Progress Notes (Signed)
 Assessment & Plan   Assessment/Plan:     Assessment & Plan Foot Pain with Neuropathy Chronic foot pain with neuropathy, primarily affecting the big toe with significant calcium deposits and arthritis. The condition has persisted for approximately 20 years, causing significant discomfort and functional impairment. Pain extends to the knees and sometimes involves the groin area. Current treatment with gabapentin  provides some relief but is insufficient. Alcohol use may contribute to neuropathy through B12, folate, and thiamine  deficiencies. Venlafaxine is chosen over duloxetine due to hepatic concerns. Pain management referral is considered for targeted therapies. - Continue gabapentin  600 mg three times a day. - Start venlafaxine 37.5 mg daily for one week, then increase to 75 mg daily. - Refer to pain management for further evaluation and targeted therapies, including potential nerve conduction studies and targeted treatments. - Coordinate pain management referral with Jacob Hill at the [facility].  Seizure Disorder Grand mal seizures, previously managed with Keppra . No seizures since increasing gabapentin , which also serves as an anti-seizure medication. Prefers to minimize medications and finds gabapentin  effective in controlling seizures. - Continue gabapentin  600 mg three times a day.  Cirrhosis Cirrhosis, likely related to alcohol use. Continues to consume alcohol regularly, which may exacerbate liver dysfunction. Requires careful consideration of medication dosing, particularly for those metabolized by the liver. Duloxetine is avoided due to hepatic impairment. - Monitor liver function tests as part of routine lab work.  Chronic Obstructive Pulmonary Disease (COPD) Chronic COPD with coarse lung sounds and mucus production. Reports feeling mucus in the lungs, indicating ongoing respiratory issues. Managed with inhalers, requires refills. - Refill Symbicort  inhaler. - Refill  albuterol  inhaler.  MDD Elevated PHQ9 with history of depression. Starting venlafaxine for neuropathy, but will monitor its affects on mood.  - Start Venlafaxine as above - Follow up PHQ9 at next visit in 1 month   General Health Maintenance Requires routine health maintenance, including lab work and medication refills. Has not had recent lab work, necessary for ongoing management of conditions. - Schedule fasting blood work in a couple of weeks to assess overall health and liver function. - Tdap given   Follow-up Requires follow-up to monitor the effectiveness of the new medication regimen and to address any additional health concerns. - Schedule follow-up appointment in a couple of weeks to a month to review lab results and discuss blood pressure management.      Medications Discontinued During This Encounter  Medication Reason   gabapentin  (NEURONTIN ) 600 MG tablet    VENTOLIN  HFA 108 (90 Base) MCG/ACT inhaler Reorder   SYMBICORT  160-4.5 MCG/ACT inhaler Reorder    Return in about 1 month (around 09/12/2023) for physical (fasting labs), BP, COPD, Foot Pain, Mood.        Subjective:   Encounter date: 08/13/2023  Jacob Hill is a 53 y.o. male who has Idiopathic peripheral neuropathy; DJD (degenerative joint disease), ankle and foot; Alcoholic cirrhosis of liver (HCC); Essential hypertension, benign; Pernicious anemia; Generalized anxiety disorder; GERD (gastroesophageal reflux disease); Alcohol abuse; Thrombocytopenia (HCC); Gastric varices; Common bile duct dilation; Portal hypertensive gastropathy (HCC); Esophageal varices without bleeding (HCC); Polysubstance abuse (HCC); COPD with chronic bronchitis (HCC); Tobacco dependence; Seizure disorder (HCC); Right flank pain; and Shoulder pain on their problem list..   He  has a past medical history of Alcohol abuse, Cirrhosis, alcoholic (HCC) (2013), Coagulopathy (HCC) (09/2011), COPD (chronic obstructive pulmonary disease)  (HCC), DJD (degenerative joint disease), GERD (gastroesophageal reflux disease), Hypertension, Marijuana abuse, Neuropathy, Opiate dependence (HCC) (2010), Osteoarthritis, Seizures (  HCC), Thrombocytopenia (HCC) (09/2013), and Tobacco abuse.SABRA   He presents with chief complaint of Establish Care, Referral, and feet pain .   Discussed the use of AI scribe software for clinical note transcription with the patient, who gave verbal consent to proceed.  History of Present Illness Jacob Hill is a 53 year old male with neuropathy and calciphylaxis who presents with ongoing foot pain.  He has been experiencing foot pain for approximately twenty years, attributed to neuropathy and calciphylaxis. Significant calcium deposits in his toes, particularly the big toe, have led to increased size and discomfort. The calcium deposits cause his toes to rub against each other, previously resulting in an ulcer on his big toe. The pain is constant and severe, impacting his mood and daily activities.  He also experiences pain in his hands, particularly in his thumbs and right wrist, which sometimes swells and affects his ability to hold objects, such as a broom. He describes a popping sensation in his wrist that prevents him from holding items.  He has a history of alcohol use and cirrhosis, consuming a forty-ounce beer almost daily. He has a past diagnosis of B12, folate, and thiamine  deficiency, which can contribute to neuropathy. He is currently taking gabapentin  600 mg three times a day, which helps with both his foot pain and seizure disorder. He previously took Keppra  for seizures but discontinued it after starting gabapentin .  His neuropathy symptoms include 'pins and needles' sensations extending from his feet up to his knees, and sometimes into his groin and hands. He likens the pain to 'a black hornet' in his pants, indicating severe discomfort.  He has a history of COPD and feels mucus in his lungs. He  is using Symbicort  and albuterol  for management of his respiratory symptoms.     ROS  Past Surgical History:  Procedure Laterality Date   ESOPHAGOGASTRODUODENOSCOPY (EGD) WITH PROPOFOL  N/A 11/08/2015   Procedure: ESOPHAGOGASTRODUODENOSCOPY (EGD) WITH PROPOFOL ;  Surgeon: Toribio SHAUNNA Cedar, MD;  Location: [facility];  Service: Endoscopy;  Laterality: N/A;   HEMORRHOID SURGERY      Outpatient Medications Prior to Visit  Medication Sig Dispense Refill   amLODipine  (NORVASC ) 10 MG tablet Take 1 tablet (10 mg total) by mouth daily. 90 tablet 1   folic acid  (FOLVITE ) 1 MG tablet Take 1 tablet (1 mg total) by mouth daily. 90 tablet 1   gabapentin  (NEURONTIN ) 600 MG tablet Take 1 tablet (600 mg total) by mouth 3 (three) times daily. 270 tablet 1   SYMBICORT  160-4.5 MCG/ACT inhaler INHALE 2 PUFFS BY MOUTH TWICE DAILY 10.2 g 5   VENTOLIN  HFA 108 (90 Base) MCG/ACT inhaler INHALE 2 PUFFS BY MOUTH EVERY 4 HOURS 18 g 3   levETIRAcetam  (KEPPRA ) 500 MG tablet Take 1 tablet (500 mg total) by mouth 2 (two) times daily. (Patient not taking: Reported on 08/13/2023) 60 tablet 1   nadolol  (CORGARD ) 40 MG tablet Take 1 tablet (40 mg total) by mouth daily. (Patient not taking: Reported on 08/13/2023) 60 tablet 4   oxyCODONE  (ROXICODONE ) 5 MG immediate release tablet Take 1 tablet (5 mg total) by mouth every 6 (six) hours as needed for severe pain. (Patient not taking: Reported on 08/13/2023) 12 tablet 0   thiamine  (VITAMIN B-1) 100 MG tablet Take 1 tablet (100 mg total) by mouth daily. (Patient not taking: Reported on 08/13/2023) 30 tablet 3   No facility-administered medications prior to visit.    Family History  Problem Relation Age of Onset  Diabetes Other    Hypertension Other    Diabetes Father     Social History   Socioeconomic History   Marital status: Single    Spouse name: Not on file   Number of children: Not on file   Years of education: Not on file   Highest education level: Not on file   Occupational History   Not on file  Tobacco Use   Smoking status: Every Day    Current packs/day: 0.50    Average packs/day: 0.5 packs/day for 15.0 years (7.5 ttl pk-yrs)    Types: Cigarettes   Smokeless tobacco: Never  Vaping Use   Vaping status: Never Used  Substance and Sexual Activity   Alcohol use: Yes    Alcohol/week: 9.0 standard drinks of alcohol    Types: 9 Cans of beer per week    Comment: No beer in 7 days   Drug use: Yes    Types: Marijuana    Comment: Occas   Sexual activity: Not Currently  Other Topics Concern   Not on file  Social History Narrative   Not on file   Social Drivers of Health   Financial Resource Strain: High Risk (03/18/2023)   Overall Financial Resource Strain (CARDIA)    Difficulty of Paying Living Expenses: Hard  Food Insecurity: No Food Insecurity (03/18/2023)   Hunger Vital Sign    Worried About Running Out of Food in the Last Year: Never true    Ran Out of Food in the Last Year: Never true  Transportation Needs: Not on file  Physical Activity: Inactive (03/18/2023)   Exercise Vital Sign    Days of Exercise per Week: 0 days    Minutes of Exercise per Session: 0 min  Stress: Stress Concern Present (03/18/2023)   [employer] of Occupational Health - Occupational Stress Questionnaire    Feeling of Stress : Rather much  Social Connections: Moderately Integrated (03/18/2023)   Social Connection and Isolation Panel    Frequency of Communication with Friends and Family: Twice a week    Frequency of Social Gatherings with Friends and Family: Once a week    Attends Religious Services: 1 to 4 times per year    Active Member of [employer] or Organizations: No    Attends Banker Meetings: Never    Marital Status: Living with partner  Intimate Partner Violence: Not At Risk (03/18/2023)   Humiliation, Afraid, Rape, and Kick questionnaire    Fear of Current or Ex-Partner: No    Emotionally Abused: No    Physically Abused: No     Sexually Abused: No                                                                                                  Objective:  Physical Exam: BP (!) 146/84   Pulse 96   Temp 98.2 F (36.8 C) (Temporal)   Ht 6' (1.829 m)   Wt 189 lb 9.6 oz (86 kg)   SpO2 97%   BMI 25.71 kg/m    Physical Exam GENERAL: Alert, cooperative, well developed, no acute distress  HEENT: Normocephalic, normal oropharynx, moist mucous membranes CHEST: Coarse lung sounds present, no wheezes, rhonchi, or crackles CARDIOVASCULAR: Normal heart rate and rhythm, S1 and S2 normal without murmurs ABDOMEN: Soft, non-tender, non-distended, without organomegaly, normal bowel sounds EXTREMITIES: No cyanosis or edema, Spoon nails, no ulcers on feet, reduced flexion and extension, mild tremor in feet (resting)  NEUROLOGICAL: Cranial nerves grossly intact, moves all extremities without gross motor or sensory deficit   Physical Exam  No results found.  No results found for this or any previous visit (from the past 2160 hours).      Beverley Adine Hummer, MD, MS

## 2023-08-20 ENCOUNTER — Emergency Department (HOSPITAL_BASED_OUTPATIENT_CLINIC_OR_DEPARTMENT_OTHER)
Admission: EM | Admit: 2023-08-20 | Discharge: 2023-08-20 | Disposition: A | Discharge status: Home / Self Care | Attending: Emergency Medicine | Admitting: Emergency Medicine

## 2023-08-20 ENCOUNTER — Other Ambulatory Visit: Payer: Self-pay

## 2023-08-20 ENCOUNTER — Emergency Department (HOSPITAL_BASED_OUTPATIENT_CLINIC_OR_DEPARTMENT_OTHER)

## 2023-08-20 DIAGNOSIS — J449 Chronic obstructive pulmonary disease, unspecified: Secondary | ICD-10-CM | POA: Insufficient documentation

## 2023-08-20 DIAGNOSIS — R0602 Shortness of breath: Secondary | ICD-10-CM | POA: Insufficient documentation

## 2023-08-20 DIAGNOSIS — Z79899 Other long term (current) drug therapy: Secondary | ICD-10-CM | POA: Diagnosis not present

## 2023-08-20 DIAGNOSIS — Z7951 Long term (current) use of inhaled steroids: Secondary | ICD-10-CM | POA: Diagnosis not present

## 2023-08-20 DIAGNOSIS — I10 Essential (primary) hypertension: Secondary | ICD-10-CM | POA: Diagnosis not present

## 2023-08-20 LAB — CBC WITH DIFFERENTIAL/PLATELET
Abs Immature Granulocytes: 0.07 10*3/uL (ref 0.00–0.07)
Basophils Absolute: 0.1 10*3/uL (ref 0.0–0.1)
Basophils Relative: 1 %
Eosinophils Absolute: 0.1 10*3/uL (ref 0.0–0.5)
Eosinophils Relative: 0 %
HCT: 41.8 % (ref 39.0–52.0)
Hemoglobin: 15.1 g/dL (ref 13.0–17.0)
Immature Granulocytes: 1 %
Lymphocytes Relative: 11 %
Lymphs Abs: 1.4 10*3/uL (ref 0.7–4.0)
MCH: 34.2 pg — ABNORMAL HIGH (ref 26.0–34.0)
MCHC: 36.1 g/dL — ABNORMAL HIGH (ref 30.0–36.0)
MCV: 94.8 fL (ref 80.0–100.0)
Monocytes Absolute: 1.6 10*3/uL — ABNORMAL HIGH (ref 0.1–1.0)
Monocytes Relative: 13 %
Neutro Abs: 9.3 10*3/uL — ABNORMAL HIGH (ref 1.7–7.7)
Neutrophils Relative %: 74 %
Platelets: 160 10*3/uL (ref 150–400)
RBC: 4.41 MIL/uL (ref 4.22–5.81)
RDW: 13.3 % (ref 11.5–15.5)
WBC: 12.5 10*3/uL — ABNORMAL HIGH (ref 4.0–10.5)
nRBC: 0 % (ref 0.0–0.2)

## 2023-08-20 LAB — COMPREHENSIVE METABOLIC PANEL WITH GFR
ALT: 70 U/L — ABNORMAL HIGH (ref 0–44)
AST: 84 U/L — ABNORMAL HIGH (ref 15–41)
Albumin: 4.2 g/dL (ref 3.5–5.0)
Alkaline Phosphatase: 50 U/L (ref 38–126)
Anion gap: 13 (ref 5–15)
BUN: 9 mg/dL (ref 6–20)
CO2: 23 mmol/L (ref 22–32)
Calcium: 9 mg/dL (ref 8.9–10.3)
Chloride: 100 mmol/L (ref 98–111)
Creatinine, Ser: 0.61 mg/dL (ref 0.61–1.24)
GFR, Estimated: >60 mL/min (ref >60)
Glucose, Bld: 87 mg/dL (ref 70–99)
Potassium: 3 mmol/L — ABNORMAL LOW (ref 3.5–5.1)
Sodium: 135 mmol/L (ref 135–145)
Total Bilirubin: 2 mg/dL — ABNORMAL HIGH (ref 0.0–1.2)
Total Protein: 7.7 g/dL (ref 6.5–8.1)

## 2023-08-20 LAB — PRO BRAIN NATRIURETIC PEPTIDE: Pro Brain Natriuretic Peptide: 183 pg/mL (ref <300.0)

## 2023-08-20 MED ORDER — POTASSIUM CHLORIDE CRYS ER 20 MEQ PO TBCR: 20.0000 meq | EXTENDED_RELEASE_TABLET | Freq: Every day | ORAL | 0 refills | Status: DC

## 2023-08-20 MED ORDER — POTASSIUM CHLORIDE CRYS ER 20 MEQ PO TBCR
40.0000 meq | EXTENDED_RELEASE_TABLET | Freq: Once | ORAL | Status: AC
  Administered 2023-08-20: 40 meq via ORAL
  Filled 2023-08-20: qty 2

## 2023-08-20 MED ORDER — FUROSEMIDE 10 MG/ML IJ SOLN: 20.0000 mg | Freq: Once | INTRAMUSCULAR | Status: DC

## 2023-08-20 MED ORDER — FUROSEMIDE 20 MG PO TABS: 20.0000 mg | ORAL_TABLET | Freq: Every day | ORAL | 0 refills | Status: DC

## 2023-08-20 MED ORDER — TRAMADOL HCL 50 MG PO TABS
50.0000 mg | ORAL_TABLET | Freq: Once | ORAL | Status: AC
  Administered 2023-08-20: 50 mg via ORAL
  Filled 2023-08-20: qty 1

## 2023-08-20 NOTE — ED Provider Notes (Signed)
 Clinical Course as of 08/20/23 1832  Wed Aug 20, 2023  1829 Received signout; dispo pending further lab workup but anticipate admission for new onset CHF.  Labs with minor low potassium.  Repleted his AST ALT and bilirubin are about baseline.  Minor leukocytosis chest x-ray with vascular congestion.  Possible atypical infection.  Agree with Dr. Cleatis assessment that symptoms could likely be secondary to new onset heart failure, patient does have significant comorbidities.  Discussed admission with him at length, however he declines and would like to be discharged.  He will be doing so AGAINST MEDICAL ADVICE.  I had a lengthy conversation with him with my concern for new onset heart failure.  I explained the risks of leaving to include decompensation that could lead to permanent disability or even death.  Patient has capacity, voiced understanding.  My clinical opinion has the autonomy to make such decision.  As a next best step, will refer him to cardiology and give him a prescription for Lasix.  Will also give potassium supplementation given his low potassium today.  Urged patient to promptly follow-up with his primary doctors and cardiology.  I have also informed him that if he changes his mind we will gladly see him back here in the emergency department.  He was given return precautions.  Will discharge AGAINST MEDICAL ADVICE at this time. [TY]    Clinical Course User Index [TY] Neysa Caron PARAS, DO      Neysa Caron PARAS, [place] 08/20/23 [phone]

## 2023-08-20 NOTE — ED Triage Notes (Signed)
 Bilateral leg swelling x 3 days , worsening shortness of breath , Hx COPD .  Right shoulder pain x 3 days .

## 2023-08-20 NOTE — ED Notes (Signed)
 RT ambulated with pt with pulse ox connected to pt. His O2 saturation remained between 92-94% the majority of the walk and briefly dipped to 90% but increased back to 92%.

## 2023-08-20 NOTE — Discharge Instructions (Addendum)
 You have chosen to leave AGAINST MEDICAL ADVICE.  As next step we have referred you to see cardiology.  They should call to schedule an appointment.  However, you may use the number we have provided to call as well.  We are prescribing you a fluid pill to help remove some fluid.  Please take them as well as the potassium supplementation.  Return immediately if develop fevers, chills, chest pain, worsening shortness of breath, lightheadedness, passout or any new or worsening symptoms that are concerning to you.  If you change your mind tonight and would like to be admitted, you may return and we will gladly see you.

## 2023-08-20 NOTE — ED Provider Notes (Signed)
 [place] [facility] Provider Note   CSN: [id number] Arrival date & time: 08/20/23  1249     Patient presents with: Leg Swelling (bilateral)   Jacob Hill is a 53 y.o. male.  {Add pertinent medical, surgical, social history, OB history to HPI:32967} HPI   53 year old male with past medical history of COPD, HTN, polysubstance abuse presents to the emergency department with concern for shortness of breath.  Patient states for the last 3 nights he has been unable to lay flat secondary to shortness of breath.  He feels like he is choking when he lays flat.  This is improved with standing up, walking around.  He woke up today noting swelling in his bilateral lower extremities that is painful.  He admits to a daily cough that feels wet but is not productive.  Denies any hemoptysis.  No history of PE.  At this time denies any chest pain or heaviness.  No recent fever or illness, is otherwise compliant with his medications.  Prior to Admission medications   Medication Sig Start Date End Date Taking? Authorizing Provider  albuterol  (VENTOLIN  HFA) 108 (90 Base) MCG/ACT inhaler Inhale 2 puffs into the lungs every 4 (four) hours as needed for wheezing or shortness of breath. 08/13/23   Sebastian Beverley NOVAK, MD  amLODipine  (NORVASC ) 10 MG tablet Take 1 tablet (10 mg total) by mouth daily. 03/18/23   Tanda Bleacher, MD  budesonide -formoterol  (SYMBICORT ) 160-4.5 MCG/ACT inhaler Inhale 2 puffs into the lungs 2 (two) times daily. 08/13/23   Sebastian Beverley NOVAK, MD  folic acid  (FOLVITE ) 1 MG tablet Take 1 tablet (1 mg total) by mouth daily. 08/07/22   Tanda Bleacher, MD  gabapentin  (NEURONTIN ) 600 MG tablet Take 1 tablet (600 mg total) by mouth 3 (three) times daily. 08/13/23   Sebastian Beverley NOVAK, MD  levETIRAcetam  (KEPPRA ) 500 MG tablet Take 1 tablet (500 mg total) by mouth 2 (two) times daily. Patient not taking: Reported Hill 08/13/2023 02/01/21   Brien Belvie BRAVO, MD   nadolol  (CORGARD ) 40 MG tablet Take 1 tablet (40 mg total) by mouth daily. Patient not taking: Reported Hill 08/13/2023 02/01/21   Brien Belvie BRAVO, MD  oxyCODONE  (ROXICODONE ) 5 MG immediate release tablet Take 1 tablet (5 mg total) by mouth every 6 (six) hours as needed for severe pain. Patient not taking: Reported Hill 08/13/2023 11/27/21   Yolande Lamar BROCKS, MD  thiamine  (VITAMIN B-1) 100 MG tablet Take 1 tablet (100 mg total) by mouth daily. Patient not taking: Reported Hill 08/13/2023 02/01/21   Brien Belvie BRAVO, MD  venlafaxine  XR (EFFEXOR  XR) 37.5 MG 24 hr capsule Take 1 capsule (37.5 mg total) by mouth daily with breakfast for 7 days, THEN 2 capsules (75 mg total) daily with breakfast for 23 days. 08/13/23 09/12/23  Sebastian Beverley NOVAK, MD    Allergies: Codeine, Nsaids, and Phenytoin sodium extended    Review of Systems  Constitutional:  Positive for fatigue. Negative for fever.  Respiratory:  Positive for cough and shortness of breath. Negative for choking and chest tightness.   Cardiovascular:  Positive for leg swelling. Negative for chest pain and palpitations.  Gastrointestinal:  Negative for abdominal pain, diarrhea and vomiting.  Skin:  Negative for rash.  Neurological:  Negative for headaches.    Updated Vital Signs BP (!) 158/104 (BP Location: Left Arm)   Pulse 96   Temp 98.3 F (36.8 C) (Oral)   Resp 20   SpO2 94%  Physical Exam Vitals and nursing note reviewed.  Constitutional:      General: He is not in acute distress.    Appearance: Normal appearance.  HENT:     Head: Normocephalic.     Mouth/Throat:     Mouth: Mucous membranes are moist.  Cardiovascular:     Rate and Rhythm: Normal rate.  Pulmonary:     Effort: Pulmonary effort is normal. No respiratory distress.     Comments: Coarse breath sounds in the mid and lower lung fields Abdominal:     Palpations: Abdomen is soft.     Tenderness: There is no abdominal tenderness.  Musculoskeletal:     Comments:  1-2+ pitting edema bilaterally in the lower extremities with intact cap refill  Skin:    General: Skin is warm.  Neurological:     Mental Status: He is alert and oriented to person, place, and time. Mental status is at baseline.  Psychiatric:        Mood and Affect: Mood normal.     (all labs ordered are listed, but only abnormal results are displayed) Labs Reviewed  CBC WITH DIFFERENTIAL/PLATELET  COMPREHENSIVE METABOLIC PANEL WITH GFR  PRO BRAIN NATRIURETIC PEPTIDE    EKG: None  Radiology: No results found.  {Document cardiac monitor, telemetry assessment procedure when appropriate:32947} Procedures   Medications Ordered in the ED  traMADol  (ULTRAM ) tablet 50 mg (has no administration in time range)      {Click here for ABCD2, HEART and other calculators REFRESH Note before signing:1}                              Medical Decision Making Amount and/or Complexity of Data Reviewed Labs: ordered. Radiology: ordered.  Risk Prescription drug management.   ***  {Document critical care time when appropriate  Document review of labs and clinical decision tools ie CHADS2VASC2, etc  Document your independent review of radiology images and any outside records  Document your discussion with family members, caretakers and with consultants  Document social determinants of health affecting pt's care  Document your decision making why or why not admission, treatments were needed:32947:::1}   Final diagnoses:  None    ED Discharge Orders     None

## 2023-08-28 ENCOUNTER — Ambulatory Visit: Attending: Cardiology | Admitting: Cardiology

## 2023-08-28 ENCOUNTER — Encounter: Payer: Self-pay | Admitting: Cardiology

## 2023-08-28 VITALS — BP 142/90 | HR 96 | Ht 72.0 in | Wt 183.4 lb

## 2023-08-28 DIAGNOSIS — Z7689 Persons encountering health services in other specified circumstances: Secondary | ICD-10-CM | POA: Insufficient documentation

## 2023-08-28 DIAGNOSIS — I10 Essential (primary) hypertension: Secondary | ICD-10-CM | POA: Insufficient documentation

## 2023-08-28 DIAGNOSIS — R0602 Shortness of breath: Secondary | ICD-10-CM | POA: Diagnosis present

## 2023-08-28 MED ORDER — LOSARTAN POTASSIUM 25 MG PO TABS: 25.0000 mg | ORAL_TABLET | Freq: Every day | ORAL | 3 refills | Status: AC

## 2023-08-28 NOTE — Patient Instructions (Signed)
 Medication Instructions:   START TAKING LOSARTAN  25 MG BY MOUTH DAILY  *If you need a refill on your cardiac medications before your next appointment, please call your pharmacy*   Lab Work:  TODAY DOWNSTAIRS FIRST FLOOR OF THIS BUILDING AT [facility]   If you have labs (blood work) drawn today and your tests are completely normal, you will receive your results only by: MyChart Message (if you have MyChart) OR A paper copy in the mail If you have any lab test that is abnormal or we need to change your treatment, we will call you to review the results.   Testing/Procedures:  Your physician has requested that you have an echocardiogram. Echocardiography is a painless test that uses sound waves to create images of your heart. It provides your doctor with information about the size and shape of your heart and how well your heart's chambers and valves are working. This procedure takes approximately one hour. There are no restrictions for this procedure. Please do NOT wear cologne, perfume, aftershave, or lotions (deodorant is allowed). Please arrive 15 minutes prior to your appointment time.  Please note: We ask at that you not bring children with you during ultrasound (echo/ vascular) testing. Due to room size and safety concerns, children are not allowed in the ultrasound rooms during exams. Our front office staff cannot provide observation of children in our lobby area while testing is being conducted. An adult accompanying a patient to their appointment will only be allowed in the ultrasound room at the discretion of the ultrasound technician under special circumstances. We apologize for any inconvenience.   Follow-Up:  2 MONTHS WITH ANY EXTENDER IN THE OFFICE

## 2023-08-28 NOTE — Progress Notes (Signed)
 Cardiology Office Note:   Date:  08/28/2023  ID:  [name], DOB 11-Jun-1970, MRN [id number] PCP: Jacob Beverley NOVAK, MD  [facility] Providers Cardiologist:  None    History of Present Illness:   Discussed the use of AI scribe software for clinical note transcription with the patient, who gave verbal consent to proceed.  History of Present Illness Jacob Hill is a 53 year old male with COPD and hypertension who presents with shortness of breath and lower extremity swelling. He was referred by the emergency department for evaluation of suspected congestive heart failure.  He has experienced shortness of breath for three to four nights, particularly when lying flat, which improved upon standing and moving around. He described a 'choking sensation' when lying flat and noted that walking around alleviated his symptoms. He also experienced swelling in his lower extremities, which prompted him to seek medical attention.  In the emergency department, a chest x-ray showed interstitial edema, and a ProBNP test was conducted with a value of 183. He was prescribed Lasix  and potassium, which he took for seven days, resulting in significant diuresis and resolution of the swelling within two days. He urinated frequently during this period and has not taken Lasix  since yesterday (completed course).  He has a history of COPD, which typically causes exertional shortness of breath but not when lying down. He uses Symbicort  twice daily and rarely uses a rescue inhaler. No recent changes in his COPD symptoms except for the recent episodes of nocturnal dyspnea.  He has a history of hypertension, usually around 148/90 mmHg, and is on amlodipine . No history of diabetes, stroke, or heart attack. Family history of heart disease in his grandfather and great-grandfather.  Socially, he smokes about five cigarettes a day, having reduced from two packs a day over many years. He also consumes a  40-ounce beer daily. 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  No chest pain, palpitations, dizziness, or lightheadedness. No recent changes in weight, although he noted a weight of 189 pounds at the hospital and 183 pounds at the clinic today, suggesting fluid loss.  Studies Reviewed:    EKG:   EKG Interpretation Date/Time:  Thursday August 28 2023 13:35:26 EDT Ventricular Rate:  94 PR Interval:  152 QRS Duration:  92 QT Interval:  362 QTC Calculation: 452 R Axis:   44  Text Interpretation: Sinus rhythm with occasional Premature ventricular complexes When compared with ECG of 20-Aug-2023 14:54, PREVIOUS ECG IS PRESENT Confirmed by Trudy Birmingham [phone]) on 08/28/2023 1:40:05 PM   Risk Assessment/Calculations:     HYPERTENSION CONTROL Vitals:   08/28/23 1336 08/28/23 1412  BP: (!) 148/90 (!) 142/90    The patient's blood pressure is elevated above target today.  In order to address the patient's elevated BP: A new medication was prescribed today.           Physical Exam:   VS:  BP (!) 142/90   Pulse 96   Ht 6' (1.829 m)   Wt 183 lb 6.4 oz (83.2 kg)   SpO2 94%   BMI 24.87 kg/m    Wt Readings from Last 3 Encounters:  08/28/23 183 lb 6.4 oz (83.2 kg)  08/13/23 189 lb 9.6 oz (86 kg)  03/18/23 190 lb 6.4 oz (86.4 kg)     Physical Exam Vitals reviewed.  Constitutional:      Appearance: Normal appearance.  HENT:  Head: Normocephalic.  Eyes:     Pupils: Pupils are equal, round, and reactive to light.  Cardiovascular:     Rate and Rhythm: Normal rate and regular rhythm.     Pulses: Normal pulses.     Heart sounds: Normal heart sounds.  Pulmonary:     Effort: Pulmonary effort is normal.     Breath sounds: Normal breath sounds.  Abdominal:     General: Abdomen is flat.     Palpations: Abdomen is soft.  Musculoskeletal:     Right lower leg: No edema.     Left lower leg: No  edema.  Skin:    General: Skin is warm and dry.     Capillary Refill: Capillary refill takes less than 2 seconds.  Neurological:     General: No focal deficit present.     Mental Status: He is alert and oriented to person, place, and time.  Psychiatric:        Mood and Affect: Mood normal.        Behavior: Behavior normal.        Thought Content: Thought content normal.        Judgment: Judgment normal.     ASSESSMENT AND PLAN:    Assessment & Plan Suspected congestive heart failure Suspected congestive heart failure due to symptoms of orthopnea and peripheral edema. ProBNP was normal, but clinical suspicion remains. Emphasis on managing fluid to prevent hospitalizations. - Order echocardiogram to evaluate heart function. - Check BMET. If renal function normal, would consider PRN lasix . - Monitor symptoms and adjust treatment based on echocardiogram results.  Shortness of breath Episodes of shortness of breath when lying flat, resolved with Lasix . Differential includes heart failure given improvement with diuretics. - Order echocardiogram to assess cardiac function and rule out heart failure. - Symptoms not consistent with CAD/anginal equivalent but patient with multiple risk factors for CAD. Low threshold for cardiac CTA vs LHC with abnormal echo or if progressive symptoms. - Needs lipid panel (has fasting labs pending with PCP on 7/25).  Peripheral edema Recent peripheral edema resolved with Lasix . Edema likely related to fluid retention, possibly due to suspected heart failure. - Check kidney function today to assess impact of Lasix . - Consider as-needed dosing of Lasix  based on kidney function results.  Chronic Obstructive Pulmonary Disease (COPD) COPD with baseline shortness of breath, typically exacerbated by exertion. No recent increase in exertional dyspnea. Shortness of breath episodes at night not typical for COPD. - Continue Symbicort  as prescribed. - Monitor for  changes in shortness of breath.  Hypertension Hypertension with current blood pressure readings around 148/90 mmHg. Currently on amlodipine  10mg . Blood pressure higher than desired range. Lifestyle factors such as smoking and alcohol consumption may contribute to elevated blood pressure. Losartan  chosen for its renal protective effects. - Start losartan  25 mg daily. Will need follow up BMET (labs pending with PCP on 7/25). - Recheck blood pressure at next visit. - Encourage reduction in smoking and alcohol consumption.  Polysubstance abuse Polysubstance abuse with current smoking of 5 cigarettes per day and daily consumption of 40 oz beer. Smoking and alcohol use may contribute to cardiovascular risk and hypertension. Alcohol identified as a carcinogen and risk factor for heart failure. - Encourage reduction in smoking and alcohol consumption. - Discuss potential for medication-assisted treatment for alcohol use.          Signed, Artist Pouch, PA-C

## 2023-08-29 ENCOUNTER — Ambulatory Visit: Payer: Self-pay | Admitting: Cardiology

## 2023-08-29 LAB — BASIC METABOLIC PANEL WITH GFR
BUN/Creatinine Ratio: 14 (ref 9–20)
BUN: 12 mg/dL (ref 6–24)
CO2: 19 mmol/L — ABNORMAL LOW (ref 20–29)
Calcium: 9.5 mg/dL (ref 8.7–10.2)
Chloride: 107 mmol/L — ABNORMAL HIGH (ref 96–106)
Creatinine, Ser: 0.88 mg/dL (ref 0.76–1.27)
Glucose: 86 mg/dL (ref 70–99)
Potassium: 4.1 mmol/L (ref 3.5–5.2)
Sodium: 145 mmol/L — ABNORMAL HIGH (ref 134–144)
eGFR: 103 mL/min/1.73 (ref >59)

## 2023-08-29 MED ORDER — POTASSIUM CHLORIDE CRYS ER 20 MEQ PO TBCR: 20.0000 meq | EXTENDED_RELEASE_TABLET | Freq: Every day | ORAL | 3 refills | Status: AC

## 2023-08-29 MED ORDER — FUROSEMIDE 20 MG PO TABS
20.0000 mg | ORAL_TABLET | ORAL | 3 refills | Status: AC | PRN
Start: 2023-08-29 — End: ?

## 2023-09-12 ENCOUNTER — Ambulatory Visit (INDEPENDENT_AMBULATORY_CARE_PROVIDER_SITE_OTHER): Admitting: Family Medicine

## 2023-09-12 VITALS — BP 140/80 | HR 80 | Temp 97.9°F | Ht 72.0 in | Wt 184.2 lb

## 2023-09-12 DIAGNOSIS — I8510 Secondary esophageal varices without bleeding: Secondary | ICD-10-CM | POA: Diagnosis not present

## 2023-09-12 DIAGNOSIS — I10 Essential (primary) hypertension: Secondary | ICD-10-CM

## 2023-09-12 DIAGNOSIS — F411 Generalized anxiety disorder: Secondary | ICD-10-CM

## 2023-09-12 DIAGNOSIS — K3189 Other diseases of stomach and duodenum: Secondary | ICD-10-CM | POA: Diagnosis not present

## 2023-09-12 DIAGNOSIS — Z0000 Encounter for general adult medical examination without abnormal findings: Secondary | ICD-10-CM

## 2023-09-12 DIAGNOSIS — J4489 Other specified chronic obstructive pulmonary disease: Secondary | ICD-10-CM

## 2023-09-12 DIAGNOSIS — G40909 Epilepsy, unspecified, not intractable, without status epilepticus: Secondary | ICD-10-CM | POA: Diagnosis not present

## 2023-09-12 DIAGNOSIS — K7030 Alcoholic cirrhosis of liver without ascites: Secondary | ICD-10-CM

## 2023-09-12 DIAGNOSIS — I864 Gastric varices: Secondary | ICD-10-CM

## 2023-09-12 DIAGNOSIS — Z0001 Encounter for general adult medical examination with abnormal findings: Secondary | ICD-10-CM

## 2023-09-12 DIAGNOSIS — D696 Thrombocytopenia, unspecified: Secondary | ICD-10-CM

## 2023-09-12 DIAGNOSIS — K766 Portal hypertension: Secondary | ICD-10-CM

## 2023-09-12 DIAGNOSIS — G609 Hereditary and idiopathic neuropathy, unspecified: Secondary | ICD-10-CM

## 2023-09-12 DIAGNOSIS — Z125 Encounter for screening for malignant neoplasm of prostate: Secondary | ICD-10-CM | POA: Diagnosis not present

## 2023-09-12 DIAGNOSIS — Z1211 Encounter for screening for malignant neoplasm of colon: Secondary | ICD-10-CM

## 2023-09-12 DIAGNOSIS — F17200 Nicotine dependence, unspecified, uncomplicated: Secondary | ICD-10-CM

## 2023-09-12 LAB — CBC WITH DIFFERENTIAL/PLATELET
Basophils Absolute: 0 K/uL (ref 0.0–0.1)
Basophils Relative: 0.7 % (ref 0.0–3.0)
Eosinophils Absolute: 0.2 K/uL (ref 0.0–0.7)
Eosinophils Relative: 2.1 % (ref 0.0–5.0)
HCT: 47.5 % (ref 39.0–52.0)
Hemoglobin: 16.4 g/dL (ref 13.0–17.0)
Lymphocytes Relative: 20.9 % (ref 12.0–46.0)
Lymphs Abs: 1.5 K/uL (ref 0.7–4.0)
MCHC: 34.5 g/dL (ref 30.0–36.0)
MCV: 97.2 fl (ref 78.0–100.0)
Monocytes Absolute: 1.3 K/uL — ABNORMAL HIGH (ref 0.1–1.0)
Monocytes Relative: 18.3 % — ABNORMAL HIGH (ref 3.0–12.0)
Neutro Abs: 4.2 K/uL (ref 1.4–7.7)
Neutrophils Relative %: 58 % (ref 43.0–77.0)
Platelets: 192 K/uL (ref 150.0–400.0)
RBC: 4.88 Mil/uL (ref 4.22–5.81)
RDW: 13.5 % (ref 11.5–15.5)
WBC: 7.3 K/uL (ref 4.0–10.5)

## 2023-09-12 LAB — MICROALBUMIN / CREATININE URINE RATIO
Creatinine,U: 99.2 mg/dL
Microalb Creat Ratio: 15.2 mg/g (ref 0.0–30.0)
Microalb, Ur: 1.5 mg/dL (ref 0.0–1.9)

## 2023-09-12 LAB — COMPREHENSIVE METABOLIC PANEL WITH GFR
ALT: 31 U/L (ref 0–53)
AST: 38 U/L — ABNORMAL HIGH (ref 0–37)
Albumin: 4.2 g/dL (ref 3.5–5.2)
Alkaline Phosphatase: 50 U/L (ref 39–117)
BUN: 8 mg/dL (ref 6–23)
CO2: 28 meq/L (ref 19–32)
Calcium: 9.9 mg/dL (ref 8.4–10.5)
Chloride: 103 meq/L (ref 96–112)
Creatinine, Ser: 0.86 mg/dL (ref 0.40–1.50)
GFR: 99.34 mL/min (ref >60.00)
Glucose, Bld: 100 mg/dL — ABNORMAL HIGH (ref 70–99)
Potassium: 3.2 meq/L — ABNORMAL LOW (ref 3.5–5.1)
Sodium: 141 meq/L (ref 135–145)
Total Bilirubin: 2.2 mg/dL — ABNORMAL HIGH (ref 0.2–1.2)
Total Protein: 7.8 g/dL (ref 6.0–8.3)

## 2023-09-12 LAB — LIPID PANEL
Cholesterol: 150 mg/dL (ref 0–200)
HDL: 53.3 mg/dL (ref >39.00)
LDL Cholesterol: 87 mg/dL (ref 0–99)
NonHDL: 97.15
Total CHOL/HDL Ratio: 3
Triglycerides: 50 mg/dL (ref 0.0–149.0)
VLDL: 10 mg/dL (ref 0.0–40.0)

## 2023-09-12 LAB — PSA: PSA: 0.38 ng/mL (ref 0.10–4.00)

## 2023-09-12 LAB — HEMOGLOBIN A1C: Hgb A1c MFr Bld: 4.7 % (ref 4.6–6.5)

## 2023-09-12 LAB — PROTIME-INR
INR: 1.3 ratio — ABNORMAL HIGH (ref 0.8–1.0)
Prothrombin Time: 13.9 s — ABNORMAL HIGH (ref 9.6–13.1)

## 2023-09-12 LAB — B12 AND FOLATE PANEL
Folate: 12.3 ng/mL (ref >5.9)
Vitamin B-12: 488 pg/mL (ref 211–911)

## 2023-09-12 LAB — TSH: TSH: 1.23 u[IU]/mL (ref 0.35–5.50)

## 2023-09-12 LAB — APTT: aPTT: 32.3 s (ref 25.4–36.8)

## 2023-09-12 MED ORDER — SPIRONOLACTONE 25 MG PO TABS: 12.5000 mg | ORAL_TABLET | Freq: Every day | ORAL | 3 refills | Status: AC

## 2023-09-12 MED ORDER — VENLAFAXINE HCL ER 37.5 MG PO CP24
ORAL_CAPSULE | ORAL | 0 refills | Status: AC
Start: 2023-09-12 — End: 2023-10-17

## 2023-09-12 NOTE — Addendum Note (Signed)
 Addended by: SEBASTIAN BEVERLEY NOVAK on: 09/12/2023 02:18 PM   Modules accepted: Orders

## 2023-09-12 NOTE — Progress Notes (Signed)
 Assessment  Assessment/Plan:  Assessment and Plan Assessment & Plan Chronic Liver Disease with Cirrhosis and Portal Hypertension Alcoholic cirrhosis with portal hypertensive gastropathy, esophageal and gastric varices, and thrombocytopenia secondary to alcohol abuse. Elevated AST and ALT, total bilirubin 2.0. At risk for hepatocellular carcinoma, requiring ongoing surveillance. - Order comprehensive liver function assessment including APTT, PTINR, urinalysis, AFP, LFTs, metabolic panel, CBC, microalbumin creatinine ratio, hemoglobin A1c, lipid panel, TSH, and iron labs - Refer to gastroenterology for hepatocellular carcinoma screening with abdominal ultrasonography and AFP - Recommend assessment for esophageal varices - Recommend Fibroscan for elastography and portal hypertension - Refer to hepatology for ongoing liver function surveillance  Alcohol Use Disorder Alcohol use disorder with recent significant reduction in intake. Concerns about withdrawal symptoms, including tremors. Gabapentin  may mitigate seizure risk during withdrawal. - Provide resources for detoxification and alcohol substance abuse support - Monitor for withdrawal symptoms and consider inpatient management if necessary  Peripheral Neuropathy Chronic peripheral neuropathy likely secondary to alcohol abuse. Persistent nerve pain described as electrical, zingy, and numbness. Gabapentin  provides partial relief. Potential vitamin deficiencies discussed. - Follow up with [facility] for pain management - Check vitamin B12, thiamine , and folate levels for deficiencies contributing to neuropathy  Seizure Disorder Seizure disorder potentially exacerbated by alcohol withdrawal. Gabapentin  may provide seizure protection. Emphasized medication adherence to prevent recurrence. - Continue gabapentin  for neuropathy and seizure management - Monitor for seizure activity, especially during alcohol withdrawal  Anxiety and  Depression Improvement in mood with venlafaxine  37.5 mg daily, but inconsistent use due to pharmacy issues. Venlafaxine  chosen for minimal liver impact. Interested in psychiatric consultation. - Resend prescription for venlafaxine  37.5 mg daily - Refer to psychiatry for depression management  Hypertension Mildly elevated blood pressure at 140/80 mmHg. On amlodipine  10 mg, furosemide  20 mg, and losartan  25 mg daily. Recent CMP showed mild hypokalemia. Discussed potential addition of spironolactone. - Add spironolactone 12.5 mg for blood pressure management - Monitor blood pressure in 1 month  Chronic Obstructive Pulmonary Disease (COPD) COPD and chronic bronchitis managed with inhalers. Improved breathing since fluid management. Smoking cessation recommended to prevent further lung damage. - Recommend smoking cessation to prevent further lung damage  General Health Maintenance Due for colorectal cancer screening. Increased risk for colon cancer due to cirrhosis and portal hypertension. Cologuard not recommended. Gastroenterology referral needed. - Refer to gastroenterology for colorectal cancer screening - Add PSA screening for prostate cancer  Follow-up Issues obtaining medications from pharmacy. Follow-up needed for medication adherence and blood pressure monitoring. Importance of care coordination and referral processing discussed. - Schedule follow-up appointment in one month to monitor blood pressure and medication adherence - Ensure referrals and lab orders are processed and followed up     Medications Discontinued During This Encounter  Medication Reason   oxyCODONE  (ROXICODONE ) 5 MG immediate release tablet    nadolol  (CORGARD ) 40 MG tablet    venlafaxine  XR (EFFEXOR  XR) 37.5 MG 24 hr capsule Reorder    Patient Counseling(The following topics were reviewed and/or handout was given):  -Nutrition: Stressed importance of moderation in sodium/caffeine intake, saturated fat and  cholesterol, caloric balance, sufficient intake of fresh fruits, vegetables, and fiber.  -Stressed the importance of regular exercise.   -Substance Abuse: Discussed cessation/primary prevention of tobacco, alcohol, or other drug use; driving or other dangerous activities under the influence; availability of treatment for abuse.   -Injury prevention: Discussed safety belts, safety helmets, smoke detector, smoking near bedding or upholstery.   -Sexuality: Discussed sexually transmitted diseases, partner  selection, use of condoms, avoidance of unintended pregnancy and contraceptive alternatives.   -Dental health: Discussed importance of regular tooth brushing, flossing, and dental visits.  -Health maintenance and immunizations reviewed. Please refer to Health maintenance section.  Return in about 1 month (around 10/13/2023) for BP.        Subjective:   Encounter date: 09/12/2023  Chief Complaint  Patient presents with   Annual Exam    Discussed the use of AI scribe software for clinical note transcription with the patient, who gave verbal consent to proceed.  History of Present Illness Jacob Hill is a 53 year old male with anxiety, depression, COPD, and alcoholic cirrhosis who presents for follow-up and annual physical exam.  He has experienced some improvement in mood since starting venlafaxine  37.5 mg daily, although inconsistent use due to pharmacy issues. He is concerned about potential liver effects and side effects of the medication. He continues to experience nerve pain related to peripheral neuropathy, described as 'electrical, zingy, numbness' and 'finger in a light socket.' Gabapentin  provides some relief but is not long-lasting. He is awaiting a pain management appointment.  He has a history of COPD and chronic bronchitis. His breathing has been stable since fluid removal, but he continues to experience COPD symptoms. He is currently taking amlodipine  10 mg daily,  furosemide  20 mg daily, and losartan  25 mg daily for blood pressure management, which remains borderline elevated. He has been advised to cease smoking.  He has a significant history of alcoholic cirrhosis, portal hypertensive gastropathy, esophageal and gastric varices, and thrombocytopenia secondary to alcohol abuse. He continues to consume alcohol regularly but is attempting to reduce intake with his partner's support. He is concerned about withdrawal symptoms, including tremors.  He has a history of seizure disorder, with gabapentin  being effective in preventing seizures. He recently ran out of gabapentin , which was challenging, but he did not experience seizures during that time.  He is due for colorectal cancer screening and is uncertain about the process. He has peripheral neuropathy, which causes constant pain and irritation and affects his sleep.       09/12/2023    1:43 PM 08/13/2023    1:37 PM 03/18/2023    1:34 PM 06/10/2019    9:11 AM 06/11/2018    1:40 PM  Depression screen PHQ 2/9  Decreased Interest 1 3 0 1 3  Down, Depressed, Hopeless 0 1 1 1 3   PHQ - 2 Score 1 4 1 2 6   Altered sleeping 1 3  1 3   Tired, decreased energy 0 1  1 3   Change in appetite 0 0  1 3  Feeling bad or failure about yourself  0 0  1 0  Trouble concentrating  3  1 3   Moving slowly or fidgety/restless 0 0  1 1  Suicidal thoughts 0 0  0 0  PHQ-9 Score 2 11  8 19   Difficult doing work/chores Somewhat difficult Not difficult at all  Somewhat difficult Somewhat difficult       09/12/2023    1:44 PM 03/18/2023    1:35 PM 06/10/2019    9:13 AM 06/11/2018    1:39 PM  GAD 7 : Generalized Anxiety Score  Nervous, Anxious, on Edge 1 1 3 2   Control/stop worrying 0 1 2 3   Worry too much - different things 0 1 2 3   Trouble relaxing 1 1 2 3   Restless 0 1 0 1  Easily annoyed or irritable 1 1 2  1  Afraid - awful might happen 0 1 0 0  Total GAD 7 Score 3 7 11 13   Anxiety Difficulty Somewhat difficult Somewhat  difficult Very difficult Somewhat difficult    Health Maintenance Due  Topic Date Due   Colonoscopy  Never done       PMH:  The following were reviewed and entered/updated in epic: Past Medical History:  Diagnosis Date   Alcohol abuse    Cirrhosis, alcoholic (HCC) 2013   seen with mild splenomegaly on CT 2013.  Hep ABC negative in 2013.    Coagulopathy (HCC) 09/2011   elevated PT/INR   COPD (chronic obstructive pulmonary disease) (HCC)    DJD (degenerative joint disease)    GERD (gastroesophageal reflux disease)    Hypertension    Marijuana abuse    Neuropathy    Opiate dependence (HCC) 2010   treated with Methadone  in past.    Osteoarthritis    Seizures (HCC)    Thrombocytopenia (HCC) 09/2013   Tobacco abuse     Patient Active Problem List   Diagnosis Date Noted   Shoulder pain 01/29/2021   Right flank pain 08/15/2019   Tobacco dependence 06/14/2018   Polysubstance abuse (HCC) 05/08/2018   COPD with chronic bronchitis (HCC) 05/08/2018   Portal hypertensive gastropathy (HCC)    Esophageal varices without bleeding (HCC)    Thrombocytopenia (HCC) 11/07/2015   Gastric varices 11/07/2015   GERD (gastroesophageal reflux disease)    Alcohol abuse    Common bile duct dilation    Generalized anxiety disorder 05/11/2014   Pernicious anemia 11/03/2013   Essential hypertension, benign 06/30/2012   Alcoholic cirrhosis of liver (HCC) 02/05/2012   Seizure disorder (HCC) 11/21/2011   DJD (degenerative joint disease), ankle and foot 11/08/2010   Idiopathic peripheral neuropathy 01/06/2009    Past Surgical History:  Procedure Laterality Date   ESOPHAGOGASTRODUODENOSCOPY (EGD) WITH PROPOFOL  N/A 11/08/2015   Procedure: ESOPHAGOGASTRODUODENOSCOPY (EGD) WITH PROPOFOL ;  Surgeon: Toribio SHAUNNA Cedar, MD;  Location: [facility];  Service: Endoscopy;  Laterality: N/A;   HEMORRHOID SURGERY      Family History  Problem Relation Age of Onset   Diabetes Other    Hypertension Other     Diabetes Father     Medications- reviewed and updated Outpatient Medications Prior to Visit  Medication Sig Dispense Refill   albuterol  (VENTOLIN  HFA) 108 (90 Base) MCG/ACT inhaler Inhale 2 puffs into the lungs every 4 (four) hours as needed for wheezing or shortness of breath. 18 g 3   amLODipine  (NORVASC ) 10 MG tablet Take 1 tablet (10 mg total) by mouth daily. 90 tablet 1   budesonide -formoterol  (SYMBICORT ) 160-4.5 MCG/ACT inhaler Inhale 2 puffs into the lungs 2 (two) times daily. 10.2 g 5   folic acid  (FOLVITE ) 1 MG tablet Take 1 tablet (1 mg total) by mouth daily. 90 tablet 1   furosemide  (LASIX ) 20 MG tablet Take 1 tablet (20 mg total) by mouth as needed. Take 1 tablet (20 mg total) by mouth once daily as needed for swelling or weight gain of more than 3-4 lbs in 24 hours. 45 tablet 3   gabapentin  (NEURONTIN ) 600 MG tablet Take 1 tablet (600 mg total) by mouth 3 (three) times daily. 270 tablet 1   losartan  (COZAAR ) 25 MG tablet Take 1 tablet (25 mg total) by mouth daily. 90 tablet 3   potassium chloride  SA (KLOR-CON  M20) 20 MEQ tablet Take 1 tablet (20 mEq total) by mouth daily. Take once daily as needed when taking Lasix   45 tablet 3   thiamine  (VITAMIN B-1) 100 MG tablet Take 1 tablet (100 mg total) by mouth daily. 30 tablet 3   levETIRAcetam  (KEPPRA ) 500 MG tablet Take 1 tablet (500 mg total) by mouth 2 (two) times daily. (Patient not taking: Reported on 08/28/2023) 60 tablet 1   nadolol  (CORGARD ) 40 MG tablet Take 1 tablet (40 mg total) by mouth daily. (Patient not taking: Reported on 08/28/2023) 60 tablet 4   oxyCODONE  (ROXICODONE ) 5 MG immediate release tablet Take 1 tablet (5 mg total) by mouth every 6 (six) hours as needed for severe pain. (Patient not taking: Reported on 09/12/2023) 12 tablet 0   venlafaxine  XR (EFFEXOR  XR) 37.5 MG 24 hr capsule Take 1 capsule (37.5 mg total) by mouth daily with breakfast for 7 days, THEN 2 capsules (75 mg total) daily with breakfast for 23 days.  (Patient not taking: No sig reported) 53 capsule 0   No facility-administered medications prior to visit.    Allergies  Allergen Reactions   Codeine Nausea And Vomiting   Nsaids Nausea And Vomiting and Other (See Comments)   Phenytoin Sodium Extended Nausea And Vomiting    Social History   Socioeconomic History   Marital status: Single    Spouse name: Not on file   Number of children: Not on file   Years of education: Not on file   Highest education level: Not on file  Occupational History   Not on file  Tobacco Use   Smoking status: Every Day    Current packs/day: 0.50    Average packs/day: 0.5 packs/day for 15.0 years (7.5 ttl pk-yrs)    Types: Cigarettes   Smokeless tobacco: Never  Vaping Use   Vaping status: Never Used  Substance and Sexual Activity   Alcohol use: Yes    Alcohol/week: 9.0 standard drinks of alcohol    Types: 9 Cans of beer per week    Comment: No beer in 7 days   Drug use: Yes    Types: Marijuana    Comment: Occas   Sexual activity: Not Currently  Other Topics Concern   Not on file  Social History Narrative   Not on file   Social Drivers of Health   Financial Resource Strain: High Risk (03/18/2023)   Overall Financial Resource Strain (CARDIA)    Difficulty of Paying Living Expenses: Hard  Food Insecurity: No Food Insecurity (03/18/2023)   Hunger Vital Sign    Worried About Running Out of Food in the Last Year: Never true    Ran Out of Food in the Last Year: Never true  Transportation Needs: Not on file  Physical Activity: Inactive (03/18/2023)   Exercise Vital Sign    Days of Exercise per Week: 0 days    Minutes of Exercise per Session: 0 min  Stress: Stress Concern Present (03/18/2023)   [employer] of Occupational Health - Occupational Stress Questionnaire    Feeling of Stress : Rather much  Social Connections: Moderately Integrated (03/18/2023)   Social Connection and Isolation Panel    Frequency of Communication with Friends and  Family: Twice a week    Frequency of Social Gatherings with Friends and Family: Once a week    Attends Religious Services: 1 to 4 times per year    Active Member of [employer] or Organizations: No    Attends Banker Meetings: Never    Marital Status: Living with partner           Objective:  Physical  Exam: BP (!) 140/80   Pulse 80   Temp 97.9 F (36.6 C)   Ht 6' (1.829 m)   Wt 184 lb 3.2 oz (83.6 kg)   SpO2 97%   BMI 24.98 kg/m   Body mass index is 24.98 kg/m. Wt Readings from Last 3 Encounters:  09/12/23 184 lb 3.2 oz (83.6 kg)  08/28/23 183 lb 6.4 oz (83.2 kg)  08/13/23 189 lb 9.6 oz (86 kg)    Physical Exam VITALS: BP- 140/80 GENERAL: Alert, cooperative, well developed, no acute distress, normal appearance. HEENT: Normocephalic, normal oropharynx, moist mucous membranes. CHEST: Clear to auscultation bilaterally, no wheezes, rhonchi, or crackles. CARDIOVASCULAR: Normal heart rate and rhythm, S1 and S2 normal without murmurs. ABDOMEN: Soft, non-tender, non-distended, without organomegaly, normal bowel sounds, no abdominal distension. EXTREMITIES: No cyanosis or edema, no lower extremity edema. NEUROLOGICAL: Cranial nerves grossly intact, moves all extremities without gross motor or sensory deficit, mild tremor, alert and oriented x3.  Physical Exam      Prior labs:   Recent Results (from the past 2160 hours)  CBC with Differential     Status: Abnormal   Collection Time: 08/20/23  3:06 PM  Result Value Ref Range   WBC 12.5 (H) 4.0 - 10.5 K/uL   RBC 4.41 4.22 - 5.81 MIL/uL   Hemoglobin 15.1 13.0 - 17.0 g/dL   HCT 58.1 60.9 - 47.9 %   MCV 94.8 80.0 - 100.0 fL   MCH 34.2 (H) 26.0 - 34.0 pg   MCHC 36.1 (H) 30.0 - 36.0 g/dL   RDW 86.6 88.4 - 84.4 %   Platelets 160 150 - 400 K/uL   nRBC 0.0 0.0 - 0.2 %   Neutrophils Relative % 74 %   Neutro Abs 9.3 (H) 1.7 - 7.7 K/uL   Lymphocytes Relative 11 %   Lymphs Abs 1.4 0.7 - 4.0 K/uL   Monocytes Relative 13  %   Monocytes Absolute 1.6 (H) 0.1 - 1.0 K/uL   Eosinophils Relative 0 %   Eosinophils Absolute 0.1 0.0 - 0.5 K/uL   Basophils Relative 1 %   Basophils Absolute 0.1 0.0 - 0.1 K/uL   Immature Granulocytes 1 %   Abs Immature Granulocytes 0.07 0.00 - 0.07 K/uL    Comment: Performed at [facility], [address] [place], [place] [address]  Comprehensive metabolic panel     Status: Abnormal   Collection Time: 08/20/23  3:06 PM  Result Value Ref Range   Sodium 135 135 - 145 mmol/L   Potassium 3.0 (L) 3.5 - 5.1 mmol/L   Chloride 100 98 - 111 mmol/L   CO2 23 22 - 32 mmol/L   Glucose, Bld 87 70 - 99 mg/dL    Comment: Glucose reference range applies only to samples taken after fasting for at least 8 hours.   BUN 9 6 - 20 mg/dL   Creatinine, Ser 9.38 0.61 - 1.24 mg/dL   Calcium 9.0 8.9 - 89.6 mg/dL   Total Protein 7.7 6.5 - 8.1 g/dL   Albumin 4.2 3.5 - 5.0 g/dL   AST 84 (H) 15 - 41 U/L   ALT 70 (H) 0 - 44 U/L   Alkaline Phosphatase 50 38 - 126 U/L   Total Bilirubin 2.0 (H) 0.0 - 1.2 mg/dL   GFR, Estimated >39 >39 mL/min    Comment: (NOTE) Calculated using the CKD-EPI Creatinine Equation (2021)    Anion gap 13 5 - 15    Comment: Performed at  [facility], [address] [place], [place] [address]  Pro Brain natriuretic peptide     Status: None   Collection Time: 08/20/23  3:06 PM  Result Value Ref Range   Pro Brain Natriuretic Peptide 183.0 <300.0 pg/mL    Comment: (NOTE) Age Group        Cut-Points    Interpretation  < 50 years     450 pg/mL       NT-proBNP > 450 pg/mL indicates                                ADHF is likely              50 to 75 years  900 pg/mL      NT-proBNP > 900 pg/mL indicates          ADHF is likely  > 75 years      1800 pg/mL     NT-proBNP > 1800 pg/mL indicates          ADHF is likely                           All ages    Results between       Indeterminate. Further clinical             300 and the cut-   information is needed  to determine            point for age group   if ADHF is present.                                                             Elecsys proBNP II/ Elecsys proBNP II STAT           Cut-Point                       Interpretation  300 pg/mL                    NT-proBNP <300pg/mL indicates                             ADHF is not likely  Performed at [facility], [address] [place], [place] [address]   Basic metabolic panel with GFR     Status: Abnormal   Collection Time: 08/28/23  3:28 PM  Result Value Ref Range   Glucose 86 70 - 99 mg/dL   BUN 12 6 - 24 mg/dL   Creatinine, Ser 9.11 0.76 - 1.27 mg/dL   eGFR 896 >40 fO/fpw/8.26   BUN/Creatinine Ratio 14 9 - 20   Sodium 145 (H) 134 - 144 mmol/L   Potassium 4.1 3.5 - 5.2 mmol/L   Chloride 107 (H) 96 - 106 mmol/L   CO2 19 (L) 20 - 29 mmol/L   Calcium 9.5 8.7 - 10.2 mg/dL    Lab Results  Component Value Date   CHOL 154 01/29/2021   CHOL 242 (H) 11/03/2013   CHOL 189 01/13/2012   Lab Results  Component Value Date   HDL 52 01/29/2021  HDL 35.50 (L) 11/03/2013   HDL 13.50 (L) 01/13/2012   Lab Results  Component Value Date   LDLCALC 92 01/29/2021   LDLCALC 170 (H) 11/03/2013   LDLCALC 138 (H) 01/13/2012   Lab Results  Component Value Date   TRIG 47 01/29/2021   TRIG 185.0 (H) 11/03/2013   TRIG 188.0 (H) 01/13/2012   Lab Results  Component Value Date   CHOLHDL 3.0 01/29/2021   CHOLHDL 7 11/03/2013   CHOLHDL 14 01/13/2012   No results found for: LDLDIRECT  Last metabolic panel Lab Results  Component Value Date   GLUCOSE 86 08/28/2023   NA 145 (H) 08/28/2023   K 4.1 08/28/2023   CL 107 (H) 08/28/2023   CO2 19 (L) 08/28/2023   BUN 12 08/28/2023   CREATININE 0.88 08/28/2023   EGFR 103 08/28/2023   CALCIUM 9.5 08/28/2023   PHOS 3.5 08/15/2019   PROT 7.7 08/20/2023   ALBUMIN 4.2 08/20/2023   LABGLOB 3.9 01/29/2021   AGRATIO 1.0 (L) 01/29/2021   BILITOT 2.0 (H) 08/20/2023   ALKPHOS 50  08/20/2023   AST 84 (H) 08/20/2023   ALT 70 (H) 08/20/2023   ANIONGAP 13 08/20/2023    Lab Results  Component Value Date   HGBA1C 4.5 (L) 03/31/2012    Last CBC Lab Results  Component Value Date   WBC 12.5 (H) 08/20/2023   HGB 15.1 08/20/2023   HCT 41.8 08/20/2023   MCV 94.8 08/20/2023   MCH 34.2 (H) 08/20/2023   RDW 13.3 08/20/2023   PLT 160 08/20/2023    Lab Results  Component Value Date   TSH 1.42 09/13/2014    No results found for: PSA1, PSA  Last vitamin D No results found for: MARIEN BOLLS, VD25OH  Lab Results  Component Value Date   BILIRUBINUR NEGATIVE 08/15/2019   PROTEINUR NEGATIVE 08/15/2019   UROBILINOGEN >=8.0 02/05/2012   LEUKOCYTESUR NEGATIVE 08/15/2019    No results found for: LABMICR, MICROALBUR   At today's visit, we discussed treatment options, associated risk and benefits, and engage in counseling as needed.  Additionally the following were reviewed: Past medical records, past medical and surgical history, family and social background, as well as relevant laboratory results, imaging findings, and specialty notes, where applicable.  This message was generated using dictation software, and as a result, it may contain unintentional typos or errors.  Nevertheless, extensive effort was made to accurately convey at the pertinent aspects of the patient visit.    There may have been are other unrelated non-urgent complaints, but due to the busy schedule and the amount of time already spent with him, time does not permit to address these issues at today's visit. Another appointment may have or has been requested to review these additional issues.     Arvella Hummer, MD, MS

## 2023-09-12 NOTE — Patient Instructions (Addendum)
 VISIT SUMMARY: During your visit, we discussed your ongoing health concerns, including anxiety, depression, COPD, alcoholic cirrhosis, and peripheral neuropathy. We reviewed your current medications and addressed your concerns about potential side effects. We also discussed your alcohol use and the importance of reducing intake. Additionally, we talked about the need for various screenings and follow-up appointments to monitor your health.  YOUR PLAN: -CHRONIC LIVER DISEASE WITH CIRRHOSIS AND PORTAL HYPERTENSION: This condition involves severe liver damage due to long-term alcohol use, leading to complications like high blood pressure in the liver's veins and potential liver cancer. We will conduct a comprehensive liver function assessment and refer you to specialists for further screening and ongoing surveillance.  -ALCOHOL USE DISORDER: This is a condition where you have difficulty controlling your alcohol consumption. We discussed resources for detoxification and support, and we will monitor you for withdrawal symptoms.  -PERIPHERAL NEUROPATHY: This condition involves nerve damage, often causing pain and numbness, likely due to alcohol use. We will check for vitamin deficiencies and follow up with pain management.  -SEIZURE DISORDER: This condition involves episodes of uncontrolled electrical activity in the brain. Gabapentin  helps manage this, and we emphasized the importance of taking your medication regularly, especially during alcohol withdrawal.  -ANXIETY AND DEPRESSION: These are mental health conditions that affect your mood and overall well-being. We will resend your venlafaxine  prescription and refer you to a psychiatrist for further management.  -HYPERTENSION: This is a condition where your blood pressure is consistently too high. Please start taking spironolactone 12.5 mg daily. We will monitor your blood pressure in 1 month.  -CHRONIC OBSTRUCTIVE PULMONARY DISEASE (COPD): This is a  chronic lung condition that makes it hard to breathe. We recommend that you stop smoking to prevent further lung damage.  -GENERAL HEALTH MAINTENANCE: You are due for colorectal cancer screening and prostate cancer screening. We will refer you to a gastroenterologist for these screenings.  INSTRUCTIONS: Please schedule a follow-up appointment in one month to monitor your blood pressure and medication adherence. Ensure that all referrals and lab orders are processed and followed up.                Alcohol and Substance Abuse Treatment and Emergency Resources in the [place]  If you or someone you know is struggling with alcohol or substance abuse, there are numerous facilities in [place], [place], [place], [place], and surrounding areas that can provide support, treatment, and emergency care.  Substance Abuse Treatment Facilities  [facility]  Location: [address] [place], [place] [address] Contact: [phone] Services: Counseling, substance use treatment, detox.  [facility]  Location: [address] [place], [place] [address] Contact: [phone] Services: Outpatient treatment, mental health services.  [facility]  Location: [address] [place], [place] [address] Contact: [phone] Services: Inpatient and outpatient care, dual diagnosis treatment.  [facility]  Location: [address], [place], [place] [address] Contact: [phone] Services: Substance use treatment.  [facility]  Location: [address], [place], [place] [address] Contact: [phone] Services: Substance use and mental health treatment.  [facility]  Location: [address] [place], [place] [address] Contact: [phone] Services: Substance use treatment, detox, dual diagnosis.  [facility]  Location: [address] [place],  [place], [place] [address] Contact: [phone] Services: Substance use treatment, detox.  [facility]  Location: [address] [place], [place] [address] Contact: [phone]  [phone] Services: Substance use treatment.  Hospitals and Emergency Facilities  If urgent care is needed due to substance abuse, such as an overdose or a severe reaction, seek immediate help from local emergency departments or hospitals.  [facility]  Location: [address] [place], [place], [place] [address] Contact: [phone] Services: Emergency care, detox services.  [facility]  Location: [address] [place], [place], [place] [address] Contact: [phone] Services: Emergency care, behavioral health services.  [facility]  Location: [address] [place], [place] [address] Contact: [phone] Services: Emergency care, outpatient services.  [facility]  Location: [address], [place], [place] [address] Contact: [phone] Services: Emergency care, detox services, behavioral health services.  [facility]  Location: [address] [place], [place] [address] Contact: [phone] Services: Emergency care, substance abuse services.  [facility]  Location: [address] [place], [place] [address] Contact: [phone] Services: Emergency care, behavioral health services.  Community Support Groups  Community support groups offer ongoing support and connection with others who are facing similar challenges.  Alcoholics Anonymous (AA)  [place] Contact: [phone] [place] Contact: [phone] Website: [employer]ized[employer] Services: Support group meetings for individuals recovering from alcohol addiction.  Narcotics Anonymous  (NA)  [place] Contact: [phone] [place] Contact: [phone] Website: [employer]cz Services: Support group meetings for individuals recovering from drug addiction.  [facility]-Anon [facility]  Contact: [phone] Website: al-anon.org Services: Support groups for families and friends of those struggling with alcohol addiction.  [facility]  Website: Psychologist, clinical.com Services: Christian-based recovery programs for various forms of addiction.  Getting Help  If you need urgent help, please call 911 or visit the nearest emergency department. For non-emergency assistance, contact one of the facilities or support groups listed above to discuss treatment options tailored to your needs.  National Helpline for Substance Abuse: 1-800-662-HELP 614-729-9703)  Emergency: Dial 911  This guide is designed to help you find the resources you need to start your journey toward recovery. Don't hesitate to reach out for help.

## 2023-09-16 ENCOUNTER — Encounter: Payer: Self-pay | Admitting: Pediatrics

## 2023-09-17 LAB — URINALYSIS W MICROSCOPIC + REFLEX CULTURE
Bilirubin Urine: NEGATIVE
Glucose, UA: NEGATIVE
Hgb urine dipstick: NEGATIVE
Hyaline Cast: NONE SEEN /LPF
Ketones, ur: NEGATIVE
Leukocyte Esterase: NEGATIVE
Nitrites, Initial: NEGATIVE
Protein, ur: NEGATIVE
RBC / HPF: NONE SEEN /HPF (ref 0–2)
Specific Gravity, Urine: 1.009 (ref 1.001–1.035)
Squamous Epithelial / HPF: NONE SEEN /HPF (ref <=5)
pH: 7.5 (ref 5.0–8.0)

## 2023-09-17 LAB — AFP TUMOR MARKER: AFP-Tumor Marker: 3.2 ng/mL (ref <6.1)

## 2023-09-17 LAB — IRON,TIBC AND FERRITIN PANEL
%SAT: 52 % — ABNORMAL HIGH (ref 20–48)
Ferritin: 79 ng/mL (ref 38–380)
Iron: 147 ug/dL (ref 50–180)
TIBC: 283 ug/dL (ref 250–425)

## 2023-09-17 LAB — NO CULTURE INDICATED

## 2023-09-17 LAB — VITAMIN B1: Vitamin B1 (Thiamine): <6 nmol/L — ABNORMAL LOW (ref 8–30)

## 2023-09-19 ENCOUNTER — Telehealth: Payer: Self-pay

## 2023-09-19 ENCOUNTER — Encounter

## 2023-09-19 NOTE — Telephone Encounter (Signed)
 Multiple telephone appointments attempted to reach patient for scheduled PV for upcoming colonoscopy scheduled on 10/07/23, all unsuccessful.  However, when RN was reviewing chart, it was noted that patient is experiencing SOB and lower extremity swelling and has an appointment for an echo on Friday, 10/03/23 at 1450.  He would need to start his bowel prep the following Monday, 10/06/23.  Echo will need to be resulted and anesthesia will need to review this before patient is cleared to have procedure at [facility]  Due to tight turn-around time, screening colonoscopy canceled.  Detailed message left on patient's VM.  RN informed patient after anesthesia reviews results from echo, [facility] will contact him to reschedule colonoscopy.  Mychart message also sent to patient.  He was instructed to call back if he had any questions.

## 2023-09-22 ENCOUNTER — Ambulatory Visit (HOSPITAL_BASED_OUTPATIENT_CLINIC_OR_DEPARTMENT_OTHER)
Admission: RE | Admit: 2023-09-22 | Discharge: 2023-09-22 | Disposition: A | Discharge status: Home / Self Care | Source: Ambulatory Visit | Attending: Family Medicine | Admitting: Family Medicine

## 2023-09-22 DIAGNOSIS — K7030 Alcoholic cirrhosis of liver without ascites: Secondary | ICD-10-CM | POA: Insufficient documentation

## 2023-09-22 DIAGNOSIS — K766 Portal hypertension: Secondary | ICD-10-CM | POA: Diagnosis present

## 2023-09-22 DIAGNOSIS — K3189 Other diseases of stomach and duodenum: Secondary | ICD-10-CM | POA: Insufficient documentation

## 2023-09-23 ENCOUNTER — Ambulatory Visit: Payer: Self-pay | Admitting: Family Medicine

## 2023-09-23 DIAGNOSIS — E519 Thiamine deficiency, unspecified: Secondary | ICD-10-CM

## 2023-09-23 DIAGNOSIS — I714 Abdominal aortic aneurysm, without rupture, unspecified: Secondary | ICD-10-CM

## 2023-09-23 MED ORDER — THIAMINE HCL 100 MG PO TABS: 100.0000 mg | ORAL_TABLET | Freq: Every day | ORAL | 3 refills | Status: AC

## 2023-09-30 DIAGNOSIS — I714 Abdominal aortic aneurysm, without rupture, unspecified: Secondary | ICD-10-CM | POA: Insufficient documentation

## 2023-10-03 ENCOUNTER — Ambulatory Visit (HOSPITAL_COMMUNITY)
Admission: RE | Admit: 2023-10-03 | Discharge: 2023-10-03 | Disposition: A | Discharge status: Home / Self Care | Source: Ambulatory Visit | Attending: Cardiology | Admitting: Cardiology

## 2023-10-03 DIAGNOSIS — R0602 Shortness of breath: Secondary | ICD-10-CM

## 2023-10-03 DIAGNOSIS — I10 Essential (primary) hypertension: Secondary | ICD-10-CM

## 2023-10-03 LAB — ECHOCARDIOGRAM COMPLETE
Area-P 1/2: 3.68 cm2
S' Lateral: 3.4 cm

## 2023-10-06 ENCOUNTER — Telehealth: Payer: Self-pay

## 2023-10-06 NOTE — Telephone Encounter (Signed)
 Telephone call to patient, VM obtained and message left.  He was informed that Dr. Suzann would like him to schedule an office visit with her or APP before rescheduling colonoscopy.  Instructed to call back at his earliest convenience to schedule ov.

## 2023-10-06 NOTE — Telephone Encounter (Signed)
 Good Morning Dr. Suzann and Norleen,  This patient was scheduled for a screening colonoscopy with you, but it was postponed d/t him having symptoms of CHF and a pending echo which was done Friday afternoon.  EF on echo is over 60% and no AS. Before rescheduling the colonoscopy, would you please review his chart Dr. Suzann?  Based on his last OV on 7/25, he has a lot going on and I wanted to make sure you didn't want to see him in clinic first.  Please advise if you are ok with proceeding with screening colon in [facility] or if you prefer he has an ov. Thank you

## 2023-10-07 ENCOUNTER — Encounter: Admitting: Pediatrics

## 2023-10-17 ENCOUNTER — Encounter: Payer: Self-pay | Admitting: Family Medicine

## 2023-10-17 ENCOUNTER — Ambulatory Visit: Admitting: Family Medicine

## 2023-10-17 VITALS — BP 139/87 | HR 84 | Temp 98.3°F | Resp 18 | Wt 180.4 lb

## 2023-10-17 DIAGNOSIS — G609 Hereditary and idiopathic neuropathy, unspecified: Secondary | ICD-10-CM | POA: Diagnosis not present

## 2023-10-17 DIAGNOSIS — Z23 Encounter for immunization: Secondary | ICD-10-CM

## 2023-10-17 DIAGNOSIS — J4489 Other specified chronic obstructive pulmonary disease: Secondary | ICD-10-CM | POA: Diagnosis not present

## 2023-10-17 DIAGNOSIS — I10 Essential (primary) hypertension: Secondary | ICD-10-CM | POA: Diagnosis not present

## 2023-10-17 DIAGNOSIS — F1010 Alcohol abuse, uncomplicated: Secondary | ICD-10-CM

## 2023-10-17 DIAGNOSIS — I714 Abdominal aortic aneurysm, without rupture, unspecified: Secondary | ICD-10-CM

## 2023-10-17 MED ORDER — GABAPENTIN 800 MG PO TABS
800.0000 mg | ORAL_TABLET | Freq: Three times a day (TID) | ORAL | 3 refills | Status: AC
Start: 2023-10-17 — End: 2024-10-11

## 2023-10-17 MED ORDER — TRELEGY ELLIPTA 100-62.5-25 MCG/ACT IN AEPB: 1.0000 | INHALATION_SPRAY | Freq: Every day | RESPIRATORY_TRACT | 11 refills | Status: DC

## 2023-10-17 MED ORDER — ADULT BLOOD PRESSURE CUFF LG KIT: 1.0000 | PACK | Freq: Every day | 0 refills | Status: AC

## 2023-10-17 NOTE — Addendum Note (Signed)
 Addended by: EUGENIE ULANDA CROME on: 10/17/2023 02:02 PM   Modules accepted: Orders

## 2023-10-17 NOTE — Patient Instructions (Signed)
  VISIT SUMMARY: Today, we reviewed your blood pressure management, COPD treatment, alcohol use disorder, and abdominal aortic aneurysm. We also discussed your neuropathy symptoms and made adjustments to your medications and treatment plans.  YOUR PLAN: -ESSENTIAL HYPERTENSION: Your blood pressure is well-managed at 139/87 mmHg with your current medications: amlodipine , furosemide , and spironolactone . Hypertension means high blood pressure, which can lead to serious health problems if not controlled. We will continue your current medications and prescribe a new blood pressure cuff for home monitoring. Please contact us  if your blood pressure consistently exceeds 140/90 mmHg.  -CHRONIC OBSTRUCTIVE PULMONARY DISEASE (COPD): COPD is a chronic lung disease that makes it hard to breathe. Your current inhaler, Symbicort , is less effective, so we are switching you to Trelegy. Continue using albuterol  as your rescue inhaler. We are also referring you to [facility] for further management.  -ALCOHOL USE DISORDER: You are in the process of weaning off alcohol and experiencing withdrawal symptoms like shakes. Alcohol use disorder is a condition where you have difficulty controlling your drinking. We are increasing your gabapentin  dose to 800 mg three times a day to help manage cravings and reduce the risk of seizures.  -ABDOMINAL AORTIC ANEURYSM: An abdominal aortic aneurysm is a bulge in the aorta, the main blood vessel that supplies blood to your body. You have been referred to a vascular specialist for management. Avoid lifting over 30 pounds, and we have added quinolone antibiotics to your contraindication list due to potential risks.  -HEREDITARY AND IDIOPATHIC NEUROPATHY: Neuropathy is a condition that causes weakness, numbness, and pain, usually in your hands and feet. We are increasing your gabapentin  dose to 800 mg three times a day to help manage your symptoms.  INSTRUCTIONS: Please follow up  with [facility] as referred. Continue monitoring your blood pressure at home and contact us  if it consistently exceeds 140/90 mmHg. Avoid lifting over 30 pounds due to your abdominal aortic aneurysm. Continue taking your medications as prescribed and let us  know if you experience any new or worsening symptoms.

## 2023-10-17 NOTE — Progress Notes (Signed)
 Assessment & Plan   Assessment/Plan:          Assessment and Plan Assessment & Plan Essential hypertension Blood pressure is well-managed at 139/87 mmHg with the current regimen of amlodipine , furosemide , and spironolactone . Spironolactone  was added at a low dose due to potential liver dysfunction. The regimen is effective without side effects. - Prescribe blood pressure cuff for home monitoring - Continue current antihypertensive regimen - Advise to contact if blood pressure consistently exceeds 140/90 mmHg  Chronic obstructive pulmonary disease (COPD) Current inhaler regimen includes Symbicort  and albuterol . Symbicort  is less effective, requiring increased use, and albuterol  is not providing adequate relief. - Prescribe Trelegy to replace Symbicort  - Continue albuterol  as rescue inhaler - Refer to [facility]  Alcohol use disorder Currently in the process of weaning off alcohol with withdrawal symptoms including shakes. Gabapentin  is being used to manage cravings and potentially reduce seizure risk. - Increase gabapentin  to 800 mg three times a day  Abdominal aortic aneurysm Referred to vascular specialist for management. Advised to avoid lifting over 30 pounds. Quinolone antibiotics are contraindicated due to potential risk with aneurysm. - Update referral to [facility] - Add quinolones to contraindication list  Hereditary and idiopathic neuropathy Gabapentin  is being used to manage neuropathy symptoms. - Increase gabapentin  to 800 mg three times a day      Medications Discontinued During This Encounter  Medication Reason   budesonide -formoterol  (SYMBICORT ) 160-4.5 MCG/ACT inhaler    gabapentin  (NEURONTIN ) 600 MG tablet     Return in about 3 months (around 01/17/2024) for BP.        Subjective:   Encounter date: 10/17/2023  Jacob Hill is a 53 y.o. male who has Idiopathic peripheral neuropathy; DJD (degenerative joint disease), ankle  and foot; Alcoholic cirrhosis of liver (HCC); Essential hypertension, benign; Pernicious anemia; Generalized anxiety disorder; GERD (gastroesophageal reflux disease); Alcohol abuse; Thrombocytopenia (HCC); Gastric varices; Common bile duct dilation; Portal hypertensive gastropathy (HCC); Esophageal varices without bleeding (HCC); Polysubstance abuse (HCC); COPD with chronic bronchitis (HCC); Tobacco dependence; Seizure disorder (HCC); Right flank pain; Shoulder pain; and Abdominal aortic aneurysm (AAA) without rupture (HCC) on their problem list..   He  has a past medical history of Alcohol abuse, Cirrhosis, alcoholic (HCC) (2013), Coagulopathy (HCC) (09/2011), COPD (chronic obstructive pulmonary disease) (HCC), DJD (degenerative joint disease), GERD (gastroesophageal reflux disease), Hypertension, Marijuana abuse, Neuropathy, Opiate dependence (HCC) (2010), Osteoarthritis, Seizures (HCC), Thrombocytopenia (HCC) (09/2013), and Tobacco abuse.SABRA   He presents with chief complaint of Hypertension (1 month follow up. Pt needs new machine for blood pressure//HM due- Prevnar 20 vaccine ) .   Discussed the use of AI scribe software for clinical note transcription with the patient, who gave verbal consent to proceed.  History of Present Illness Jacob Hill is a 53 year old male with hypertension who presents for follow-up on blood pressure management.  His blood pressure was previously borderline elevated at 140/80 mmHg and has improved to 139/87 mmHg. He is currently taking amlodipine  10 mg, furosemide  20 mg, and spironolactone  12.5 mg. He is taking half of his spironolactone  dose. He requests a new blood pressure cuff as his current one is malfunctioning.  He has a history of an aortic aneurysm and has been referred to vascular specialists for further evaluation.  His COPD management with Symbicort  is less effective, requiring increased use of albuterol . He uses Symbicort  more frequently than  prescribed due to inadequate relief from albuterol .  He is in the process of weaning off alcohol  and is experiencing withdrawal symptoms such as shakes. He is currently on gabapentin  600 mg three times a day to help manage cravings and withdrawal symptoms.     ROS  Past Surgical History:  Procedure Laterality Date   ESOPHAGOGASTRODUODENOSCOPY (EGD) WITH PROPOFOL  N/A 11/08/2015   Procedure: ESOPHAGOGASTRODUODENOSCOPY (EGD) WITH PROPOFOL ;  Surgeon: Toribio SHAUNNA Cedar, MD;  Location: [facility];  Service: Endoscopy;  Laterality: N/A;   HEMORRHOID SURGERY      Outpatient Medications Prior to Visit  Medication Sig Dispense Refill   albuterol  (VENTOLIN  HFA) 108 (90 Base) MCG/ACT inhaler Inhale 2 puffs into the lungs every 4 (four) hours as needed for wheezing or shortness of breath. 18 g 3   amLODipine  (NORVASC ) 10 MG tablet Take 1 tablet (10 mg total) by mouth daily. 90 tablet 1   folic acid  (FOLVITE ) 1 MG tablet Take 1 tablet (1 mg total) by mouth daily. 90 tablet 1   furosemide  (LASIX ) 20 MG tablet Take 1 tablet (20 mg total) by mouth as needed. Take 1 tablet (20 mg total) by mouth once daily as needed for swelling or weight gain of more than 3-4 lbs in 24 hours. 45 tablet 3   losartan  (COZAAR ) 25 MG tablet Take 1 tablet (25 mg total) by mouth daily. 90 tablet 3   omeprazole  (PRILOSEC) 20 MG capsule Take 20 mg by mouth.     potassium chloride  SA (KLOR-CON  M20) 20 MEQ tablet Take 1 tablet (20 mEq total) by mouth daily. Take once daily as needed when taking Lasix  45 tablet 3   spironolactone  (ALDACTONE ) 25 MG tablet Take 0.5 tablets (12.5 mg total) by mouth daily. 90 tablet 3   thiamine  (VITAMIN B-1) 100 MG tablet Take 1 tablet (100 mg total) by mouth daily. 30 tablet 3   thiamine  (VITAMIN B1) 100 MG tablet Take 1 tablet (100 mg total) by mouth daily. 90 tablet 3   venlafaxine  XR (EFFEXOR  XR) 37.5 MG 24 hr capsule Take 1 capsule (37.5 mg total) by mouth daily with breakfast for 7 days, THEN 2  capsules (75 mg total) daily with breakfast for 23 days. 53 capsule 0   budesonide -formoterol  (SYMBICORT ) 160-4.5 MCG/ACT inhaler Inhale 2 puffs into the lungs 2 (two) times daily. 10.2 g 5   gabapentin  (NEURONTIN ) 600 MG tablet Take 1 tablet (600 mg total) by mouth 3 (three) times daily. 270 tablet 1   levETIRAcetam  (KEPPRA ) 500 MG tablet Take 1 tablet (500 mg total) by mouth 2 (two) times daily. (Patient not taking: Reported on 10/17/2023) 60 tablet 1   No facility-administered medications prior to visit.    Family History  Problem Relation Age of Onset   Diabetes Other    Hypertension Other    Diabetes Father     Social History   Socioeconomic History   Marital status: Single    Spouse name: Not on file   Number of children: Not on file   Years of education: Not on file   Highest education level: Not on file  Occupational History   Not on file  Tobacco Use   Smoking status: Every Day    Current packs/day: 0.50    Average packs/day: 0.5 packs/day for 50.7 years (25.3 ttl pk-yrs)    Types: Cigarettes    Start date: 60   Smokeless tobacco: Never   Tobacco comments:    Patient smoke 5-10 cigarettes per day   Vaping Use   Vaping status: Never Used  Substance and Sexual Activity   Alcohol  use: Yes    Alcohol/week: 9.0 standard drinks of alcohol    Types: 9 Cans of beer per week    Comment: No beer in 7 days   Drug use: Yes    Types: Marijuana    Comment: Occas   Sexual activity: Not Currently  Other Topics Concern   Not on file  Social History Narrative   Not on file   Social Drivers of Health   Financial Resource Strain: High Risk (03/18/2023)   Overall Financial Resource Strain (CARDIA)    Difficulty of Paying Living Expenses: Hard  Food Insecurity: No Food Insecurity (03/18/2023)   Hunger Vital Sign    Worried About Running Out of Food in the Last Year: Never true    Ran Out of Food in the Last Year: Never true  Transportation Needs: Not on file  Physical  Activity: Inactive (03/18/2023)   Exercise Vital Sign    Days of Exercise per Week: 0 days    Minutes of Exercise per Session: 0 min  Stress: Stress Concern Present (03/18/2023)   [employer] of Occupational Health - Occupational Stress Questionnaire    Feeling of Stress : Rather much  Social Connections: Moderately Integrated (03/18/2023)   Social Connection and Isolation Panel    Frequency of Communication with Friends and Family: Twice a week    Frequency of Social Gatherings with Friends and Family: Once a week    Attends Religious Services: 1 to 4 times per year    Active Member of [employer] or Organizations: No    Attends Banker Meetings: Never    Marital Status: Living with partner  Intimate Partner Violence: Not At Risk (03/18/2023)   Humiliation, Afraid, Rape, and Kick questionnaire    Fear of Current or Ex-Partner: No    Emotionally Abused: No    Physically Abused: No    Sexually Abused: No                                                                                                  Objective:  Physical Exam: BP 139/87 (BP Location: Left Arm, Patient Position: Sitting, Cuff Size: Normal)   Pulse 84   Temp 98.3 F (36.8 C) (Temporal)   Resp 18   Wt 180 lb 6.4 oz (81.8 kg)   SpO2 96%   BMI 24.47 kg/m    Physical Exam VITALS: BP- 139/87 GENERAL: Alert, cooperative, well developed, no acute distress. HEENT: Normocephalic, normal oropharynx, moist mucous membranes. CHEST: Clear to auscultation bilaterally, no wheezes, rhonchi, or crackles. CARDIOVASCULAR: Normal heart rate and rhythm, S1 and S2 normal without murmurs. ABDOMEN: Soft, non-tender, non-distended, without organomegaly, normal bowel sounds. EXTREMITIES: No cyanosis or edema. NEUROLOGICAL: Cranial nerves grossly intact, moves all extremities without gross motor or sensory deficit.   Physical Exam  ECHOCARDIOGRAM COMPLETE Result Date: 10/03/2023    ECHOCARDIOGRAM REPORT   Patient Name:    FRIEND DORFMAN Date of Exam: 10/03/2023 Medical Rec #:  [id number]           Height:       72.0 in Accession #:  [id number]          Weight:       184.2 lb Date of Birth:  1970/06/14           BSA:          2.057 m Patient Age:    52 years            BP:           123/80 mmHg Patient Gender: M                   HR:           84 bpm. Exam Location:  [address] Procedure: 2D Echo, Cardiac Doppler, Color Doppler, 3D Echo and Strain Analysis            (Both Spectral and Color Flow Doppler were utilized during            procedure). Indications:    SOB R06.02  History:        Patient has prior history of Echocardiogram examinations, most                 recent 11/06/2020. COPD; Risk Factors:Hypertension.  Sonographer:    Augustin Seals RDCS Referring Phys: [id number] ARTIST POUCH IMPRESSIONS  1. Redundant MV chordae noted.  2. Left ventricular ejection fraction, by estimation, is 60 to 65%. The left ventricle has normal function. The left ventricle has no regional wall motion abnormalities. Left ventricular diastolic parameters were normal. The average left ventricular global longitudinal strain is -20.6 %. The global longitudinal strain is normal.  3. Right ventricular systolic function is normal. The right ventricular size is normal.  4. The mitral valve is normal in structure. Trivial mitral valve regurgitation. No evidence of mitral stenosis.  5. The aortic valve is tricuspid. Aortic valve regurgitation is not visualized. No aortic stenosis is present.  6. The inferior vena cava is normal in size with greater than 50% respiratory variability, suggesting right atrial pressure of 3 mmHg. Comparison(s): No prior Echocardiogram. FINDINGS  Left Ventricle: Left ventricular ejection fraction, by estimation, is 60 to 65%. The left ventricle has normal function. The left ventricle has no regional wall motion abnormalities. The average left ventricular global longitudinal strain is -20.6 %. Strain was performed and  the global longitudinal strain is normal. The left ventricular internal cavity size was normal in size. There is no left ventricular hypertrophy. Left ventricular diastolic parameters were normal. Right Ventricle: The right ventricular size is normal. Right ventricular systolic function is normal. Left [facility]: Left atrial size was normal in size. Right [facility]: Right atrial size was normal in size. Pericardium: There is no evidence of pericardial effusion. Mitral Valve: The mitral valve is normal in structure. Trivial mitral valve regurgitation. No evidence of mitral valve stenosis. Tricuspid Valve: The tricuspid valve is normal in structure. Tricuspid valve regurgitation is trivial. No evidence of tricuspid stenosis. Aortic Valve: The aortic valve is tricuspid. Aortic valve regurgitation is not visualized. No aortic stenosis is present. Pulmonic Valve: The pulmonic valve was normal in structure. Pulmonic valve regurgitation is not visualized. No evidence of pulmonic stenosis. Aorta: The aortic root is normal in size and structure. Venous: The inferior vena cava is normal in size with greater than 50% respiratory variability, suggesting right atrial pressure of 3 mmHg. IAS/Shunts: No atrial level shunt detected by color flow Doppler. Additional Comments: Redundant MV chordae noted. 3D was performed not requiring image post processing on an independent workstation and was  normal.  LEFT VENTRICLE PLAX 2D LVIDd:         4.90 cm   Diastology LVIDs:         3.40 cm   LV e' medial:    8.27 cm/s LV PW:         1.10 cm   LV E/e' medial:  9.8 LV IVS:        1.10 cm   LV e' lateral:   10.10 cm/s LVOT diam:     2.50 cm   LV E/e' lateral: 8.0 LV SV:         103 LV SV Index:   50        2D Longitudinal Strain LVOT Area:     4.91 cm  2D Strain GLS Avg:     -20.6 %                           3D Volume EF:                          3D EF:        55 %                          LV EDV:       221 ml                          LV ESV:        99 ml                          LV SV:        121 ml RIGHT VENTRICLE             IVC RV Basal diam:  4.60 cm     IVC diam: 2.40 cm RV Mid diam:    3.70 cm RV S prime:     16.00 cm/s TAPSE (M-mode): 2.5 cm LEFT ATRIUM             Index        RIGHT ATRIUM           Index LA diam:        4.60 cm 2.24 cm/m   RA Area:     18.00 cm LA Vol (A2C):   75.9 ml 36.89 ml/m  RA Volume:   49.10 ml  23.87 ml/m LA Vol (A4C):   53.1 ml 25.81 ml/m LA Biplane Vol: 63.8 ml 31.01 ml/m  AORTIC VALVE LVOT Vmax:   109.00 cm/s LVOT Vmean:  70.200 cm/s LVOT VTI:    0.209 m  AORTA Ao Root diam: 3.60 cm Ao Asc diam:  4.00 cm MITRAL VALVE MV Area (PHT): 3.68 cm    SHUNTS MV Decel Time: 206 msec    Systemic VTI:  0.21 m MV E velocity: 81.20 cm/s  Systemic Diam: 2.50 cm MV A velocity: 77.60 cm/s MV E/A ratio:  1.05 Redell Shallow MD Electronically signed by Redell Shallow MD Signature Date/Time: 10/03/2023/3:27:54 PM    Final    US  ABDOMEN COMPLETE W/ELASTOGRAPHY Result Date: 09/27/2023 CLINICAL DATA:  Cirrhosis EXAM: ULTRASOUND ABDOMEN ULTRASOUND HEPATIC ELASTOGRAPHY TECHNIQUE: Sonography of the upper abdomen was performed. In addition, ultrasound elastography evaluation of the liver was performed. A region of interest was placed within the right lobe of the  liver. Following application of a compressive sonographic pulse, tissue compressibility was assessed. Multiple assessments were performed at the selected site. Median tissue compressibility was determined. Previously, hepatic stiffness was assessed by shear wave velocity. Based on recently published Society of Radiologists in Ultrasound consensus article, reporting is now recommended to be performed in the SI units of pressure (kiloPascals) representing hepatic stiffness/elasticity. The obtained result is compared to the published reference standards. (cACLD = compensated Advanced Chronic Liver Disease) COMPARISON:  None Available. FINDINGS: ULTRASOUND ABDOMEN Gallbladder: Suspect  minimal sludge in the gallbladder. No discrete gallstones or wall thickening visualized. No sonographic Murphy sign noted by sonographer. Common bile duct: Diameter: 1.1 cm. Intrahepatic biliary ductal dilatation. Liver: No solid lesion identified. Heterogeneous parenchymal echogenicity. Portal vein is patent on color Doppler imaging with normal direction of blood flow towards the liver. Recanalization of the umbilical vein. IVC: No abnormality visualized. Pancreas: Visualized portion unremarkable. Spleen: Size and appearance within normal limits. Right Kidney: Length: 10.4 cm. Echogenicity within normal limits. No mass or hydronephrosis visualized. Left Kidney: Length: 13.0 cm. Echogenicity within normal limits. No mass or hydronephrosis visualized. Abdominal aorta: Maximum diameter of the proximal aorta 3.0 cm. Other findings: None. ULTRASOUND HEPATIC ELASTOGRAPHY Device: Siemens Helix VTQ Patient position: Oblique Transducer DAX Number of measurements: 13 Hepatic segment:  8 Median kPa: 13.7 IQR: 9.7 IQR/Median kPa ratio: 0.62 Data quality: i. IQR/Median kPa ratio of 0.6 or greater when using the DAX probe indicates reduced accuracy Diagnostic category:  >13 kPa: highly suggestive of cACLD The use of hepatic elastography is applicable to patients with viral hepatitis and non-alcoholic fatty liver disease. At this time, there is insufficient data for the referenced cut-off values and use in other causes of liver disease, including alcoholic liver disease. Patients, however, may be assessed by elastography and serve as their own reference standard/baseline. In patients with non-alcoholic liver disease, the values suggesting compensated advanced chronic liver disease (cACLD) may be lower, and patients may need additional testing with elasticity results of 7-9 kPa. Please note that abnormal hepatic elasticity and shear wave velocities may also be identified in clinical settings other than with hepatic fibrosis, such  as: acute hepatitis, elevated right heart and central venous pressures including use of beta blockers, veno-occlusive disease (Budd-Chiari), infiltrative processes such as mastocytosis/amyloidosis/infiltrative tumor/lymphoma, extrahepatic cholestasis, with hyperemia in the post-prandial state, and with liver transplantation. Correlation with patient history, laboratory data, and clinical condition recommended. Diagnostic Categories: < or =5 kPa: high probability of being normal < or =9 kPa: in the absence of other known clinical signs, rules out cACLD >9 kPa and ?13 kPa: suggestive of cACLD, but needs further testing >13 kPa: highly suggestive of cACLD > or =17 kPa: highly suggestive of cACLD with an increased probability of clinically significant portal hypertension IMPRESSION: ULTRASOUND ABDOMEN: 1. Heterogeneous parenchymal echotexture. Stigmata of portal hypertension including recanalization of the umbilical vein. 2.  Aneurysm of the proximal abdominal aorta measuring up to 3.0 cm. ULTRASOUND HEPATIC ELASTOGRAPHY: Median kPa:  13.7 Diagnostic category:  >13 kPa: highly suggestive of cACLD Electronically Signed   By: Marolyn JONETTA Jaksch M.D.   On: 09/27/2023 15:11   DG Chest Port 1 View Result Date: 08/20/2023 CLINICAL DATA:  orthopnea , bilateral leg swelling EXAM: PORTABLE CHEST - 1 VIEW COMPARISON:  None available. FINDINGS: Bilateral perihilar interstitial opacities. No focal airspace consolidation, pleural effusion, or pneumothorax. No cardiomegaly.No acute fracture or destructive lesion. Multilevel thoracic osteophytosis. IMPRESSION: Mild bilateral perihilar interstitial opacities, which may reflect interstitial edema or atypical/viral  infection. Electronically Signed   By: Rogelia Myers M.D.   On: 08/20/2023 15:16    Recent Results (from the past 2160 hours)  CBC with Differential     Status: Abnormal   Collection Time: 08/20/23  3:06 PM  Result Value Ref Range   WBC 12.5 (H) 4.0 - 10.5 K/uL   RBC  4.41 4.22 - 5.81 MIL/uL   Hemoglobin 15.1 13.0 - 17.0 g/dL   HCT 58.1 60.9 - 47.9 %   MCV 94.8 80.0 - 100.0 fL   MCH 34.2 (H) 26.0 - 34.0 pg   MCHC 36.1 (H) 30.0 - 36.0 g/dL   RDW 86.6 88.4 - 84.4 %   Platelets 160 150 - 400 K/uL   nRBC 0.0 0.0 - 0.2 %   Neutrophils Relative % 74 %   Neutro Abs 9.3 (H) 1.7 - 7.7 K/uL   Lymphocytes Relative 11 %   Lymphs Abs 1.4 0.7 - 4.0 K/uL   Monocytes Relative 13 %   Monocytes Absolute 1.6 (H) 0.1 - 1.0 K/uL   Eosinophils Relative 0 %   Eosinophils Absolute 0.1 0.0 - 0.5 K/uL   Basophils Relative 1 %   Basophils Absolute 0.1 0.0 - 0.1 K/uL   Immature Granulocytes 1 %   Abs Immature Granulocytes 0.07 0.00 - 0.07 K/uL    Comment: Performed at [facility], [address] [place], [place] [address]  Comprehensive metabolic panel     Status: Abnormal   Collection Time: 08/20/23  3:06 PM  Result Value Ref Range   Sodium 135 135 - 145 mmol/L   Potassium 3.0 (L) 3.5 - 5.1 mmol/L   Chloride 100 98 - 111 mmol/L   CO2 23 22 - 32 mmol/L   Glucose, Bld 87 70 - 99 mg/dL    Comment: Glucose reference range applies only to samples taken after fasting for at least 8 hours.   BUN 9 6 - 20 mg/dL   Creatinine, Ser 9.38 0.61 - 1.24 mg/dL   Calcium 9.0 8.9 - 89.6 mg/dL   Total Protein 7.7 6.5 - 8.1 g/dL   Albumin 4.2 3.5 - 5.0 g/dL   AST 84 (H) 15 - 41 U/L   ALT 70 (H) 0 - 44 U/L   Alkaline Phosphatase 50 38 - 126 U/L   Total Bilirubin 2.0 (H) 0.0 - 1.2 mg/dL   GFR, Estimated >39 >39 mL/min    Comment: (NOTE) Calculated using the CKD-EPI Creatinine Equation (2021)    Anion gap 13 5 - 15    Comment: Performed at [facility], [address] [place], [place] [address]  Pro Brain natriuretic peptide     Status: None   Collection Time: 08/20/23  3:06 PM  Result Value Ref Range   Pro Brain Natriuretic Peptide 183.0 <300.0 pg/mL    Comment: (NOTE) Age Group        Cut-Points    Interpretation  < 50 years     450 pg/mL        NT-proBNP > 450 pg/mL indicates                                ADHF is likely              50 to 75 years  900 pg/mL      NT-proBNP > 900 pg/mL indicates  ADHF is likely  > 75 years      1800 pg/mL     NT-proBNP > 1800 pg/mL indicates          ADHF is likely                           All ages    Results between       Indeterminate. Further clinical             300 and the cut-   information is needed to determine            point for age group   if ADHF is present.                                                             Elecsys proBNP II/ Elecsys proBNP II STAT           Cut-Point                       Interpretation  300 pg/mL                    NT-proBNP <300pg/mL indicates                             ADHF is not likely  Performed at [facility], [address] [place], [place] [address]   Basic metabolic panel with GFR     Status: Abnormal   Collection Time: 08/28/23  3:28 PM  Result Value Ref Range   Glucose 86 70 - 99 mg/dL   BUN 12 6 - 24 mg/dL   Creatinine, Ser 9.11 0.76 - 1.27 mg/dL   eGFR 896 >40 fO/fpw/8.26   BUN/Creatinine Ratio 14 9 - 20   Sodium 145 (H) 134 - 144 mmol/L   Potassium 4.1 3.5 - 5.2 mmol/L   Chloride 107 (H) 96 - 106 mmol/L   CO2 19 (L) 20 - 29 mmol/L   Calcium 9.5 8.7 - 10.2 mg/dL  TSH     Status: None   Collection Time: 09/12/23  2:20 PM  Result Value Ref Range   TSH 1.23 0.35 - 5.50 uIU/mL  Lipid panel     Status: None   Collection Time: 09/12/23  2:20 PM  Result Value Ref Range   Cholesterol 150 0 - 200 mg/dL    Comment: ATP III Classification       Desirable:  < 200 mg/dL               Borderline High:  200 - 239 mg/dL          High:  > = 759 mg/dL   Triglycerides 49.9 0.0 - 149.0 mg/dL    Comment: Normal:  <849 mg/dLBorderline High:  150 - 199 mg/dL   HDL 46.69 >60.99 mg/dL   VLDL 89.9 0.0 - 59.9 mg/dL   LDL Cholesterol 87 0 - 99 mg/dL   Total CHOL/HDL Ratio 3     Comment:                Men           Women1/2 Average Risk  3.4          3.3Average Risk          5.0          4.42X Average Risk          9.6          7.13X Average Risk          15.0          11.0                       NonHDL 97.15     Comment: NOTE:  Non-HDL goal should be 30 mg/dL higher than patient's LDL goal (i.e. LDL goal of < 70 mg/dL, would have non-HDL goal of < 100 mg/dL)  Hemoglobin J8r     Status: None   Collection Time: 09/12/23  2:20 PM  Result Value Ref Range   Hgb A1c MFr Bld 4.7 4.6 - 6.5 %    Comment: Glycemic Control Guidelines for People with Diabetes:Non Diabetic:  <6%Goal of Therapy: <7%Additional Action Suggested:  >8%   Microalbumin / creatinine urine ratio     Status: None   Collection Time: 09/12/23  2:20 PM  Result Value Ref Range   Microalb, Ur 1.5 0.0 - 1.9 mg/dL   Creatinine,U 00.7 mg/dL   Microalb Creat Ratio 15.2 0.0 - 30.0 mg/g  CBC with Differential/Platelet     Status: Abnormal   Collection Time: 09/12/23  2:20 PM  Result Value Ref Range   WBC 7.3 4.0 - 10.5 K/uL   RBC 4.88 4.22 - 5.81 Mil/uL   Hemoglobin 16.4 13.0 - 17.0 g/dL   HCT 52.4 60.9 - 47.9 %   MCV 97.2 78.0 - 100.0 fl   MCHC 34.5 30.0 - 36.0 g/dL   RDW 86.4 88.4 - 84.4 %   Platelets 192.0 150.0 - 400.0 K/uL   Neutrophils Relative % 58.0 43.0 - 77.0 %   Lymphocytes Relative 20.9 12.0 - 46.0 %   Monocytes Relative 18.3 Repeated and verified X2. (H) 3.0 - 12.0 %   Eosinophils Relative 2.1 0.0 - 5.0 %   Basophils Relative 0.7 0.0 - 3.0 %   Neutro Abs 4.2 1.4 - 7.7 K/uL   Lymphs Abs 1.5 0.7 - 4.0 K/uL   Monocytes Absolute 1.3 (H) 0.1 - 1.0 K/uL   Eosinophils Absolute 0.2 0.0 - 0.7 K/uL   Basophils Absolute 0.0 0.0 - 0.1 K/uL  Comprehensive metabolic panel with GFR     Status: Abnormal   Collection Time: 09/12/23  2:20 PM  Result Value Ref Range   Sodium 141 135 - 145 mEq/L   Potassium 3.2 (L) 3.5 - 5.1 mEq/L   Chloride 103 96 - 112 mEq/L   CO2 28 19 - 32 mEq/L   Glucose, Bld 100 (H) 70 - 99 mg/dL   BUN 8 6 - 23  mg/dL   Creatinine, Ser 9.13 0.40 - 1.50 mg/dL   Total Bilirubin 2.2 (H) 0.2 - 1.2 mg/dL   Alkaline Phosphatase 50 39 - 117 U/L   AST 38 (H) 0 - 37 U/L   ALT 31 0 - 53 U/L   Total Protein 7.8 6.0 - 8.3 g/dL   Albumin 4.2 3.5 - 5.2 g/dL   GFR 00.65 >39.99 mL/min    Comment: Calculated using the CKD-EPI Creatinine Equation (2021)   Calcium 9.9 8.4 - 10.5 mg/dL  Urinalysis w microscopic + reflex cultur     Status: Abnormal  Collection Time: 09/12/23  2:20 PM   Specimen: Blood  Result Value Ref Range   Color, Urine YELLOW YELLOW   APPearance CLEAR CLEAR   Specific Gravity, Urine 1.009 1.001 - 1.035   pH 7.5 5.0 - 8.0   Glucose, UA NEGATIVE NEGATIVE   Bilirubin Urine NEGATIVE NEGATIVE   Ketones, ur NEGATIVE NEGATIVE   Hgb urine dipstick NEGATIVE NEGATIVE   Protein, ur NEGATIVE NEGATIVE   Nitrites, Initial NEGATIVE NEGATIVE   Leukocyte Esterase NEGATIVE NEGATIVE   WBC, UA 0-5 0 - 5 /HPF   RBC / HPF NONE SEEN 0 - 2 /HPF   Squamous Epithelial / HPF NONE SEEN < OR = 5 /HPF   Bacteria, UA FEW (A) NONE SEEN /HPF   Hyaline Cast NONE SEEN NONE SEEN /LPF   Note      Comment: This urine was analyzed for the presence of WBC,  RBC, bacteria, casts, and other formed elements.  Only those elements seen were reported. . .   B12 and Folate Panel     Status: None   Collection Time: 09/12/23  2:20 PM  Result Value Ref Range   Vitamin B-12 488 211 - 911 pg/mL   Folate 12.3 >5.9 ng/mL  Vitamin B1     Status: Abnormal   Collection Time: 09/12/23  2:20 PM  Result Value Ref Range   Vitamin B1 (Thiamine ) <6 (L) 8 - 30 nmol/L    Comment: (Note) Vitamin supplementation within 24 hours prior to blood  draw may affect the accuracy of the results. . This test was developed and its analytical performance  characteristics have been determined by [employer]. It has not been cleared or approved by FDA.  This assay has been validated pursuant to the CLIA  regulations and is used for clinical  purposes. . [facility] [address] [place] [address] [phone] Johanna Agent L. Gino, MD, PhD   AFP tumor marker     Status: None   Collection Time: 09/12/23  2:20 PM  Result Value Ref Range   AFP-Tumor Marker 3.2 <6.1 ng/mL    Comment: . This test was performed using the Beckman Coulter chemiluminescent method. Values obtained from different assay methods cannot be used interchangeably. AFP levels, regardless of value, should not be interpreted as absolute evidence of the presence or absence of disease. .   Iron, TIBC and Ferritin Panel     Status: Abnormal   Collection Time: 09/12/23  2:20 PM  Result Value Ref Range   Iron 147 50 - 180 mcg/dL   TIBC 716 749 - 574 mcg/dL (calc)   %SAT 52 (H) 20 - 48 % (calc)   Ferritin 79 38 - 380 ng/mL  PSA     Status: None   Collection Time: 09/12/23  2:20 PM  Result Value Ref Range   PSA 0.38 0.10 - 4.00 ng/mL    Comment: Test performed using [employer] PSA Assay, a parmagnetic partical, chemiluminecent immunoassay.  APTT     Status: None   Collection Time: 09/12/23  2:20 PM  Result Value Ref Range   aPTT 32.3 25.4 - 36.8 SEC  Protime-INR     Status: Abnormal   Collection Time: 09/12/23  2:20 PM  Result Value Ref Range   INR 1.3 (H) 0.8 - 1.0 ratio   Prothrombin Time 13.9 (H) 9.6 - 13.1 sec  REFLEXIVE URINE CULTURE     Status: None   Collection Time: 09/12/23  2:20  PM  Result Value Ref Range   Reflexve Urine Culture      Comment: NO CULTURE INDICATED  ECHOCARDIOGRAM COMPLETE     Status: None   Collection Time: 10/03/23  3:10 PM  Result Value Ref Range   Area-P 1/2 3.68 cm2   S' Lateral 3.40 cm   Est EF 60 - 65%         Beverley Adine Hummer, MD, MS

## 2023-10-29 ENCOUNTER — Ambulatory Visit: Attending: Cardiovascular Disease | Admitting: Physician Assistant

## 2023-10-29 NOTE — Progress Notes (Deleted)
 Psychiatric Initial Adult Assessment   Patient Identification: Jacob Hill MRN:  [id number] Date of Evaluation:  10/29/2023 Referral Source: Primary care provider Chief Complaint:  No chief complaint on file.  Visit Diagnosis: No diagnosis found.   Assessment:  DEJAY KRONK is a 53 y.o. male with a history of anxiety and depression who presents in person to [facility] at [facility] for initial evaluation on 10/29/2023.    At initial evaluation patient reports ***  A number of assessments were performed during the evaluation today including  PHQ-9 which they scored a *** on, GAD-7 which they scored a *** on, and [place] suicide severity screening which showed ***.    Risk Assessment: A suicide and violence risk assessment was performed as part of this evaluation. There patient is deemed to be at chronic elevated risk for self-harm/suicide given the following factors: {SABSUICIDERISKFACTORS:29780}. These risk factors are mitigated by the following factors: {SABSUICIDEPROTECTIVEFACTORS:29779}. The patient is deemed to be at chronic elevated risk for violence given the following factors: {SABVIOLENCERISKFACTORS:29781}. These risk factors are mitigated by the following factors: {SABVIOLENCEPROTECTIVEFACTORS:29782}. There is no *** acute risk for suicide or violence at this time. The patient was educated about relevant modifiable risk factors including following recommendations for treatment of psychiatric illness and abstaining from substance abuse.  While future psychiatric events cannot be accurately predicted, the patient does not *** currently require  acute inpatient psychiatric care and does not *** currently meet [place]  involuntary commitment criteria.  Patient was given contact information for crisis resources, behavioral health clinic and was instructed to call 911 for emergencies.    Plan: # MDD without psychotic features Past medication  trials:  Status of problem: Active Interventions: -- Continue Effexor  37.5 mg daily  # GAD Past medication trials:  Status of problem: Active Interventions: -- Continue Effexor  37.5 mg daily  # *** Past medication trials:  Status of problem: *** Interventions: -- ***   History of Present Illness:  ***  Associated Signs/Symptoms: Depression Symptoms:  {DEPRESSION SYMPTOMS:20000} (Hypo) Manic Symptoms:  {BHH MANIC SYMPTOMS:22872} Anxiety Symptoms:  {BHH ANXIETY SYMPTOMS:22873} Psychotic Symptoms:  {BHH PSYCHOTIC SYMPTOMS:22874} PTSD Symptoms: {BHH PTSD SYMPTOMS:22875}  Past Psychiatric History:  Past psychiatric diagnoses: Anxiety, MDD Psychiatric hospitalizations: None Past suicide attempts: Denied as Hx of self harm: Denies Hx of violence towards others: Denies Prior psychiatric providers: None Prior therapy: None Access to firearms: Denies  Prior medication trials: Effexor  and Remeron   Substance use: Denies  Past Medical History:  Past Medical History:  Diagnosis Date   Alcohol abuse    Cirrhosis, alcoholic (HCC) 2013   seen with mild splenomegaly on CT 2013.  Hep ABC negative in 2013.    Coagulopathy (HCC) 09/2011   elevated PT/INR   COPD (chronic obstructive pulmonary disease) (HCC)    DJD (degenerative joint disease)    GERD (gastroesophageal reflux disease)    Hypertension    Marijuana abuse    Neuropathy    Opiate dependence (HCC) 2010   treated with Methadone  in past.    Osteoarthritis    Seizures (HCC)    Thrombocytopenia (HCC) 09/2013   Tobacco abuse     Past Surgical History:  Procedure Laterality Date   ESOPHAGOGASTRODUODENOSCOPY (EGD) WITH PROPOFOL  N/A 11/08/2015   Procedure: ESOPHAGOGASTRODUODENOSCOPY (EGD) WITH PROPOFOL ;  Surgeon: Toribio SHAUNNA Cedar, MD;  Location: [facility];  Service: Endoscopy;  Laterality: N/A;   HEMORRHOID SURGERY      Family Psychiatric History: Nothing significant  Family History:  Family  History  Problem  Relation Age of Onset   Diabetes Other    Hypertension Other    Diabetes Father     Social History:   Social History   Socioeconomic History   Marital status: Single    Spouse name: Not on file   Number of children: Not on file   Years of education: Not on file   Highest education level: Not on file  Occupational History   Not on file  Tobacco Use   Smoking status: Every Day    Current packs/day: 0.50    Average packs/day: 0.5 packs/day for 50.7 years (25.3 ttl pk-yrs)    Types: Cigarettes    Start date: 36   Smokeless tobacco: Never   Tobacco comments:    Patient smoke 5-10 cigarettes per day   Vaping Use   Vaping status: Never Used  Substance and Sexual Activity   Alcohol use: Yes    Alcohol/week: 9.0 standard drinks of alcohol    Types: 9 Cans of beer per week    Comment: No beer in 7 days   Drug use: Yes    Types: Marijuana    Comment: Occas   Sexual activity: Not Currently  Other Topics Concern   Not on file  Social History Narrative   Not on file   Social Drivers of Health   Financial Resource Strain: High Risk (03/18/2023)   Overall Financial Resource Strain (CARDIA)    Difficulty of Paying Living Expenses: Hard  Food Insecurity: No Food Insecurity (03/18/2023)   Hunger Vital Sign    Worried About Running Out of Food in the Last Year: Never true    Ran Out of Food in the Last Year: Never true  Transportation Needs: Not on file  Physical Activity: Inactive (03/18/2023)   Exercise Vital Sign    Days of Exercise per Week: 0 days    Minutes of Exercise per Session: 0 min  Stress: Stress Concern Present (03/18/2023)   [employer] of Occupational Health - Occupational Stress Questionnaire    Feeling of Stress : Rather much  Social Connections: Moderately Integrated (03/18/2023)   Social Connection and Isolation Panel    Frequency of Communication with Friends and Family: Twice a week    Frequency of Social Gatherings with Friends and Family: Once a  week    Attends Religious Services: 1 to 4 times per year    Active Member of [employer] or Organizations: No    Attends Banker Meetings: Never    Marital Status: Living with partner    Additional Social History: ***  Allergies:   Allergies  Allergen Reactions   Codeine Nausea And Vomiting   Nsaids Nausea And Vomiting, Other (See Comments) and Nausea Only   Phenytoin Sodium Extended Nausea And Vomiting   Quinolones     Metabolic Disorder Labs: Lab Results  Component Value Date   HGBA1C 4.7 09/12/2023   No results found for: PROLACTIN Lab Results  Component Value Date   CHOL 150 09/12/2023   TRIG 50.0 09/12/2023   HDL 53.30 09/12/2023   CHOLHDL 3 09/12/2023   VLDL 10.0 09/12/2023   LDLCALC 87 09/12/2023   LDLCALC 92 01/29/2021   Lab Results  Component Value Date   TSH 1.23 09/12/2023    Therapeutic Level Labs: No results found for: LITHIUM No results found for: CBMZ No results found for: VALPROATE  Current Medications: Current Outpatient Medications  Medication Sig Dispense Refill   albuterol  (VENTOLIN  HFA) 108 (90 Base)  MCG/ACT inhaler Inhale 2 puffs into the lungs every 4 (four) hours as needed for wheezing or shortness of breath. 18 g 3   amLODipine  (NORVASC ) 10 MG tablet Take 1 tablet (10 mg total) by mouth daily. 90 tablet 1   Blood Pressure Monitoring (ADULT BLOOD PRESSURE CUFF LG) KIT 1 kit by Does not apply route daily. 1 kit 0   Fluticasone-Umeclidin-Vilant (TRELEGY ELLIPTA ) 100-62.5-25 MCG/ACT AEPB Inhale 1 puff into the lungs daily. 1 each 11   folic acid  (FOLVITE ) 1 MG tablet Take 1 tablet (1 mg total) by mouth daily. 90 tablet 1   furosemide  (LASIX ) 20 MG tablet Take 1 tablet (20 mg total) by mouth as needed. Take 1 tablet (20 mg total) by mouth once daily as needed for swelling or weight gain of more than 3-4 lbs in 24 hours. 45 tablet 3   gabapentin  (NEURONTIN ) 800 MG tablet Take 1 tablet (800 mg total) by mouth 3 (three) times  daily. 270 tablet 3   levETIRAcetam  (KEPPRA ) 500 MG tablet Take 1 tablet (500 mg total) by mouth 2 (two) times daily. (Patient not taking: Reported on 10/17/2023) 60 tablet 1   losartan  (COZAAR ) 25 MG tablet Take 1 tablet (25 mg total) by mouth daily. 90 tablet 3   omeprazole  (PRILOSEC) 20 MG capsule Take 20 mg by mouth.     potassium chloride  SA (KLOR-CON  M20) 20 MEQ tablet Take 1 tablet (20 mEq total) by mouth daily. Take once daily as needed when taking Lasix  45 tablet 3   spironolactone  (ALDACTONE ) 25 MG tablet Take 0.5 tablets (12.5 mg total) by mouth daily. 90 tablet 3   thiamine  (VITAMIN B-1) 100 MG tablet Take 1 tablet (100 mg total) by mouth daily. 30 tablet 3   thiamine  (VITAMIN B1) 100 MG tablet Take 1 tablet (100 mg total) by mouth daily. 90 tablet 3   venlafaxine  XR (EFFEXOR  XR) 37.5 MG 24 hr capsule Take 1 capsule (37.5 mg total) by mouth daily with breakfast for 7 days, THEN 2 capsules (75 mg total) daily with breakfast for 23 days. 53 capsule 0   No current facility-administered medications for this visit.    Musculoskeletal: Strength & Muscle Tone: within normal limits Gait & Station: normal Patient leans: N/A  Psychiatric Specialty Exam:  Psychiatric Specialty Exam: There were no vitals taken for this visit.There is no height or weight on file to calculate BMI. Review of Systems  General Appearance: Casual and Fairly Groomed  Eye Contact:  Good  Speech:  Clear and Coherent  Volume:  Normal  Mood:  Depressed  Affect:  Congruent  Thought Content: Logical   Suicidal Thoughts:  No  Homicidal Thoughts:  No  Thought Process:  Coherent  Orientation:  Full (Time, Place, and Person)    Memory: Immediate;   Good Recent;   Good Remote;   Good  Judgment:  Fair  Insight:  Fair  Concentration:  Concentration: Good and Attention Span: Good  Recall:  not formally assessed   Fund of Knowledge: Good  Language: Good  Psychomotor Activity:  Normal  Akathisia:  No  AIMS (if  indicated): not done  Assets:  Communication Skills Desire for Improvement Financial Resources/Insurance Housing Intimacy Leisure Time Physical Health Resilience Social Support Talents/Skills  ADL's:  Intact  Cognition: WNL  Sleep:  Fair    Screenings: AUDIT    Flowsheet Row Office Visit from 03/18/2023 in [facility] at [facility]  Alcohol Use Disorder Identification Test Final Score (AUDIT)  3   CAGE-AID    Flowsheet Row ED to Hosp-Admission (Discharged) from 08/14/2019 in [facility]  CAGE-AID Score 4   GAD-7    Flowsheet Row Office Visit from 09/12/2023 in [facility] [place] [facility] Visit from 03/18/2023 in [facility] at [facility] Visit from 06/10/2019 in [facility] [place] - A Dept Of [place]. [facility] Office Visit from 06/11/2018 in [facility] - A Dept Of [place]. [facility] Office Visit from 11/13/2015 in [facility] [place] - A Dept Of [place] [facility]. [facility]  Total GAD-7 Score 3 7 11 13 20    PHQ2-9    Flowsheet Row Office Visit from 10/17/2023 in [facility] Visit from 09/12/2023 in [facility] [place] [facility] Visit from 08/13/2023 in [facility] [place] [facility] [employer] Visit from 03/18/2023 in [facility] at [facility] Office Visit from 06/10/2019 in [facility] [place] - A Dept Of [place]. [facility]  PHQ-2 Total Score 0 1 4 1 2   PHQ-9 Total Score -- 2 11 -- 8   Flowsheet Row ED from 08/20/2023 in [facility] Department at [facility] UC from 07/09/2022 in [facility] at [facility]) ED from 11/27/2021 in [facility] Emergency Department at [facility]  C-SSRS RISK CATEGORY No Risk No Risk No Risk      Collaboration of Care: Other ***  Patient/Guardian was advised Release of Information must be obtained prior to any record release in order to collaborate their care with an outside provider. Patient/Guardian was advised if they have not already done so to contact the registration department to sign all necessary forms in order for us  to release information regarding their care.   Consent: Patient/Guardian gives verbal consent for treatment and assignment of benefits for services provided during this visit. Patient/Guardian expressed understanding and agreed to proceed.   Israel Wunder, MD 9/10/20258:40 AM

## 2023-10-29 NOTE — Progress Notes (Deleted)
    OFFICE NOTE:    Date:  10/29/2023  ID:  Ozell KANDICE Garbe, DOB 1970-06-02, MRN [id number] PCP: Sebastian Beverley NOVAK, MD  [facility] Providers Cardiologist:  None { Click to update primary MD,subspecialty MD or APP then REFRESH:1}       Shortness of breath  TTE 10/03/23: redundant MV chordae; EF 60-65, no RWMA, NL diast function, NL RVSF, trivial MR, RAP 3   Chronic Obstructive Pulmonary Disease Abdominal aortic aneurysm US  09/2023: 3 cm (PCP referred to vascular surgery) Hypertension Tobacco use  ETOH abuse Possible Cirrhosis, portal HTN (noted on US  09/2023)         Discussed the use of AI scribe software for clinical note transcription with the patient, who gave verbal consent to proceed. History of Present Illness Jacob Hill is a 53 y.o. male who returns for follow up on shortness of breath, edema. Pt was evaluated by Artist Pouch, PA-C for shortness of breath, edema in 08/2023. Pt had been to the ED prior with concerns for volume excess. He was managed with furosemide . BNP was normal. He was continued on Furosemide  prn. Losartan  was added for hypertension. Echocardiogram showed normal EF and normal diastolic parameters.     ROS-See HPI***    Studies Reviewed:      *** Results  Risk Assessment/Calculations: {Does this patient have ATRIAL FIBRILLATION?:[phone]} No BP recorded.  {Refresh Note OR Click here to enter BP  :1}***      Physical Exam:  VS:  There were no vitals taken for this visit.       Wt Readings from Last 3 Encounters:  10/17/23 180 lb 6.4 oz (81.8 kg)  09/12/23 184 lb 3.2 oz (83.6 kg)  08/28/23 183 lb 6.4 oz (83.2 kg)    Physical Exam***     Assessment and Plan:    Assessment & Plan SOB (shortness of breath)  Essential hypertension, benign  Assessment and Plan Assessment & Plan    {      :1}    {Are you ordering a CV Procedure (e.g. stress test, cath, DCCV, TEE, etc)?   Press F2        :[id number]}  Dispo:  No  follow-ups on file.  Signed, Glendia Ferrier, PA-C

## 2023-11-06 ENCOUNTER — Other Ambulatory Visit: Payer: Self-pay

## 2023-11-06 ENCOUNTER — Other Ambulatory Visit: Payer: Self-pay | Admitting: Family Medicine

## 2023-11-06 ENCOUNTER — Other Ambulatory Visit (HOSPITAL_COMMUNITY): Payer: Self-pay

## 2023-11-06 ENCOUNTER — Telehealth: Payer: Self-pay

## 2023-11-06 DIAGNOSIS — J4489 Other specified chronic obstructive pulmonary disease: Secondary | ICD-10-CM

## 2023-11-06 MED ORDER — ALBUTEROL SULFATE HFA 108 (90 BASE) MCG/ACT IN AERS: 2.0000 | INHALATION_SPRAY | RESPIRATORY_TRACT | 3 refills | Status: AC | PRN

## 2023-11-06 NOTE — Telephone Encounter (Signed)
 Copied from CRM [id number]. Topic: Clinical - Medication Prior Auth >> Nov 06, 2023  9:11 AM Berneda FALCON wrote: Reason for CRM: Patient states that this medication needs a prior authorization.  Fluticasone-Umeclidin-Vilant (TRELEGY ELLIPTA ) 100-62.5-25 MCG/ACT AEPB  [facility] - [place], [place] - [address] [place] [address] Phone: [phone] Fax: [phone] Hours: Not open 24 hours

## 2023-11-06 NOTE — Telephone Encounter (Signed)
 Copied from CRM [id number]. Topic: Clinical - Medication Refill >> Nov 06, 2023  9:13 AM Berneda FALCON wrote: Medication:  albuterol  (VENTOLIN  HFA) 108 (90 Base) MCG/ACT inhaler   Has the patient contacted their pharmacy? Yes (Agent: If no, request that the patient contact the pharmacy for the refill. If patient does not wish to contact the pharmacy document the reason why and proceed with request.) (Agent: If yes, when and what did the pharmacy advise?)  This is the patient's preferred pharmacy:  [facility] - [place], [place] - [address] [place] [address] Phone: [phone] Fax: [phone]  Is this the correct pharmacy for this prescription? Yes If no, delete pharmacy and type the correct one.   Has the prescription been filled recently? No  Is the patient out of the medication? Yes  Has the patient been seen for an appointment in the last year OR does the patient have an upcoming appointment? Yes  Can we respond through MyChart? Yes  Agent: Please be advised that Rx refills may take up to 3 business days. We ask that you follow-up with your pharmacy.

## 2023-11-06 NOTE — Telephone Encounter (Signed)
 Pharmacy Patient Advocate Encounter   Received notification from Pt Calls Messages that prior authorization for Fluticasone-Umeclidin-Vilant (TRELEGY ELLIPTA ) 100-62.5-25 MCG/ACT AEPB  is required/requested.   Insurance verification completed.   The patient is insured through [employer] .   Per test claim: PA required; PA submitted to above mentioned insurance via Latent Key/confirmation #/EOC [id number] Status is pending

## 2023-11-06 NOTE — Telephone Encounter (Signed)
 Can we start/submit prior authorization for ( Fluticasone-Umeclidin-Vilant (TRELEGY ELLIPTA ) 100-62.5-25 MCG/ACT AEPB )

## 2023-11-07 NOTE — Telephone Encounter (Signed)
 Pharmacy Patient Advocate Encounter  Received notification from [facility] that Prior Authorization for Trelegy Ellipta  100-62.5-25MCG/ACT aerosol powder has been DENIED.  Full denial letter will be uploaded to the media tab. See denial reason below.  Pt must trial and fail Both Rx's listed below per [facility] Formulary      PA #/Case ID/Reference #: [id number]

## 2023-11-10 ENCOUNTER — Ambulatory Visit (HOSPITAL_COMMUNITY): Payer: Self-pay

## 2023-11-14 ENCOUNTER — Other Ambulatory Visit (HOSPITAL_COMMUNITY): Payer: Self-pay

## 2023-11-14 ENCOUNTER — Telehealth: Payer: Self-pay

## 2023-11-14 MED ORDER — FLUTICASONE-SALMETEROL 500-50 MCG/ACT IN AEPB: 1.0000 | INHALATION_SPRAY | Freq: Two times a day (BID) | RESPIRATORY_TRACT | 0 refills | Status: DC

## 2023-11-14 NOTE — Addendum Note (Signed)
 Addended by: SEBASTIAN BEVERLEY NOVAK on: 11/14/2023 08:12 AM   Modules accepted: Orders

## 2023-11-14 NOTE — Telephone Encounter (Signed)
 Pharmacy Patient Advocate Encounter   Received notification from CoverMyMeds that prior authorization for Fluticasone -Salmeterol 500-50MCG/ACT aerosol powder is required/requested.   Insurance verification completed.   The patient is insured through [facility] .   Per test claim: PA required; PA submitted to above mentioned insurance via Latent Key/confirmation #/EOC [facility] Status is pending

## 2023-11-17 ENCOUNTER — Other Ambulatory Visit (HOSPITAL_COMMUNITY): Payer: Self-pay

## 2023-11-17 NOTE — Telephone Encounter (Signed)
 Pharmacy Patient Advocate Encounter   Received notification from CoverMyMeds that prior authorization for  Fluticasone -Salmeterol 500-50MCG/ACT aerosol powder  is required/requested.   Insurance verification completed.   The patient is insured through [facility] .   Per test claim:  BRAND NAME ADVAIR   is preferred by the insurance.  NO PA IS NEEDED AT THIS TIME. PT MUST USE UP TO 75% OF SYMBICORT  WAS JUST FILL ON 11/05/2023 DUR DUPLICATE DRUG THRAPY   PLEASE BE ADVISED

## 2023-11-18 ENCOUNTER — Other Ambulatory Visit (HOSPITAL_COMMUNITY): Payer: Self-pay

## 2024-01-20 ENCOUNTER — Ambulatory Visit: Admitting: Family Medicine

## 2024-01-21 ENCOUNTER — Ambulatory Visit: Admitting: Family Medicine

## 2024-01-21 NOTE — Progress Notes (Incomplete)
 Assessment & Plan   Assessment/Plan:    Problem List Items Addressed This Visit   None       Assessment and Plan               There are no discontinued medications.  No follow-ups on file.        Subjective:   Encounter date: 01/21/2024  Jacob Hill is a 53 y.o. male who has Idiopathic peripheral neuropathy; DJD (degenerative joint disease), ankle and foot; Alcoholic cirrhosis of liver (HCC); Essential hypertension, benign; Pernicious anemia; Generalized anxiety disorder; GERD (gastroesophageal reflux disease); Alcohol abuse; Thrombocytopenia; Gastric varices; Common bile duct dilation; Portal hypertensive gastropathy (HCC); Esophageal varices without bleeding (HCC); Polysubstance abuse (HCC); COPD with chronic bronchitis (HCC); Tobacco dependence; Seizure disorder (HCC); Right flank pain; Shoulder pain; and Abdominal aortic aneurysm (AAA) without rupture on their problem list..   He  has a past medical history of Alcohol abuse, Cirrhosis, alcoholic (HCC) (2013), Coagulopathy (09/2011), COPD (chronic obstructive pulmonary disease) (HCC), DJD (degenerative joint disease), GERD (gastroesophageal reflux disease), Hypertension, Marijuana abuse, Neuropathy, Opiate dependence (HCC) (2010), Osteoarthritis, Seizures (HCC), Thrombocytopenia (09/2013), and Tobacco abuse.Jacob Hill   He presents with chief complaint of No chief complaint on file.   Discussed the use of AI scribe software for clinical note transcription with the patient, who gave verbal consent to proceed.  History of Present Illness          Hypertension - Well-managed with the current regimen of Amlodipine  10 mg, Furosemide  20 mg as needed, and Spironolactone  12.5 mg daily.  - Last blood pressure at goal, measured at 139/87 mmHg.  ROS  Past Surgical History:  Procedure Laterality Date   ESOPHAGOGASTRODUODENOSCOPY (EGD) WITH PROPOFOL  N/A 11/08/2015   Procedure: ESOPHAGOGASTRODUODENOSCOPY (EGD) WITH  PROPOFOL ;  Surgeon: Toribio SHAUNNA Cedar, MD;  Location: [facility];  Service: Endoscopy;  Laterality: N/A;   HEMORRHOID SURGERY      Current Outpatient Medications on File Prior to Visit  Medication Sig Dispense Refill   albuterol  (VENTOLIN  HFA) 108 (90 Base) MCG/ACT inhaler Inhale 2 puffs into the lungs every 4 (four) hours as needed for wheezing or shortness of breath. 18 g 3   amLODipine  (NORVASC ) 10 MG tablet Take 1 tablet (10 mg total) by mouth daily. 90 tablet 1   Blood Pressure Monitoring (ADULT BLOOD PRESSURE CUFF LG) KIT 1 kit by Does not apply route daily. 1 kit 0   fluticasone -salmeterol (ADVAIR DISKUS) 500-50 MCG/ACT AEPB Inhale 1 puff into the lungs in the morning and at bedtime. 180 each 0   folic acid  (FOLVITE ) 1 MG tablet Take 1 tablet (1 mg total) by mouth daily. 90 tablet 1   furosemide  (LASIX ) 20 MG tablet Take 1 tablet (20 mg total) by mouth as needed. Take 1 tablet (20 mg total) by mouth once daily as needed for swelling or weight gain of more than 3-4 lbs in 24 hours. 45 tablet 3   gabapentin  (NEURONTIN ) 800 MG tablet Take 1 tablet (800 mg total) by mouth 3 (three) times daily. 270 tablet 3   levETIRAcetam  (KEPPRA ) 500 MG tablet Take 1 tablet (500 mg total) by mouth 2 (two) times daily. (Patient not taking: Reported on 10/17/2023) 60 tablet 1   losartan  (COZAAR ) 25 MG tablet Take 1 tablet (25 mg total) by mouth daily. 90 tablet 3   omeprazole  (PRILOSEC) 20 MG capsule Take 20 mg by mouth.     potassium chloride  SA (KLOR-CON  M20) 20 MEQ tablet Take 1 tablet (20 mEq  total) by mouth daily. Take once daily as needed when taking Lasix  45 tablet 3   spironolactone  (ALDACTONE ) 25 MG tablet Take 0.5 tablets (12.5 mg total) by mouth daily. 90 tablet 3   thiamine  (VITAMIN B-1) 100 MG tablet Take 1 tablet (100 mg total) by mouth daily. 30 tablet 3   thiamine  (VITAMIN B1) 100 MG tablet Take 1 tablet (100 mg total) by mouth daily. 90 tablet 3   venlafaxine  XR (EFFEXOR  XR) 37.5 MG 24 hr  capsule Take 1 capsule (37.5 mg total) by mouth daily with breakfast for 7 days, THEN 2 capsules (75 mg total) daily with breakfast for 23 days. 53 capsule 0   No current facility-administered medications on file prior to visit.    Family History  Problem Relation Age of Onset   Diabetes Other    Hypertension Other    Diabetes Father     Social History   Socioeconomic History   Marital status: Single    Spouse name: Not on file   Number of children: Not on file   Years of education: Not on file   Highest education level: Not on file  Occupational History   Not on file  Tobacco Use   Smoking status: Every Day    Current packs/day: 0.50    Average packs/day: 0.5 packs/day for 50.9 years (25.5 ttl pk-yrs)    Types: Cigarettes    Start date: 53   Smokeless tobacco: Never   Tobacco comments:    Patient smoke 5-10 cigarettes per day   Vaping Use   Vaping status: Never Used  Substance and Sexual Activity   Alcohol use: Yes    Alcohol/week: 9.0 standard drinks of alcohol    Types: 9 Cans of beer per week    Comment: No beer in 7 days   Drug use: Yes    Types: Marijuana    Comment: Occas   Sexual activity: Not Currently  Other Topics Concern   Not on file  Social History Narrative   Not on file   Social Drivers of Health   Financial Resource Strain: High Risk (03/18/2023)   Overall Financial Resource Strain (CARDIA)    Difficulty of Paying Living Expenses: Hard  Food Insecurity: No Food Insecurity (03/18/2023)   Hunger Vital Sign    Worried About Running Out of Food in the Last Year: Never true    Ran Out of Food in the Last Year: Never true  Transportation Needs: Not on file  Physical Activity: Inactive (03/18/2023)   Exercise Vital Sign    Days of Exercise per Week: 0 days    Minutes of Exercise per Session: 0 min  Stress: Stress Concern Present (03/18/2023)   [employer] of Occupational Health - Occupational Stress Questionnaire    Feeling of Stress :  Rather much  Social Connections: Moderately Integrated (03/18/2023)   Social Connection and Isolation Panel    Frequency of Communication with Friends and Family: Twice a week    Frequency of Social Gatherings with Friends and Family: Once a week    Attends Religious Services: 1 to 4 times per year    Active Member of [employer] or Organizations: No    Attends Banker Meetings: Never    Marital Status: Living with partner  Intimate Partner Violence: Not At Risk (03/18/2023)   Humiliation, Afraid, Rape, and Kick questionnaire    Fear of Current or Ex-Partner: No    Emotionally Abused: No    Physically Abused: No  Sexually Abused: No                                                                                                  Objective:  Physical Exam: There were no vitals taken for this visit.   Physical Exam           Physical Exam  No results found.  No results found for this or any previous visit (from the past 2160 hours).      Beverley KATHEE Hummer, MD  I,Emily Lagle,acting as a scribe for Beverley KATHEE Hummer, MD.,have documented all relevant documentation on the behalf of Beverley KATHEE Hummer, MD.  LILLETTE Beverley KATHEE Hummer, MD, have reviewed all documentation for this visit. The documentation on 01/21/2024 for the exam, diagnosis, procedures, and orders are all accurate and complete.

## 2024-02-23 DIAGNOSIS — J4489 Other specified chronic obstructive pulmonary disease: Secondary | ICD-10-CM
